# Patient Record
Sex: Male | Born: 1935 | Race: White | Hispanic: No | Marital: Married | Smoking: Never smoker
Health system: Southern US, Community
[De-identification: ages and names within clinical notes are randomized; demographics above are authoritative.]

## PROBLEM LIST (undated history)

## (undated) DIAGNOSIS — I2510 Atherosclerotic heart disease of native coronary artery without angina pectoris: Secondary | ICD-10-CM

## (undated) DIAGNOSIS — E11319 Type 2 diabetes mellitus with unspecified diabetic retinopathy without macular edema: Secondary | ICD-10-CM

## (undated) DIAGNOSIS — I255 Ischemic cardiomyopathy: Secondary | ICD-10-CM

## (undated) DIAGNOSIS — E1142 Type 2 diabetes mellitus with diabetic polyneuropathy: Secondary | ICD-10-CM

## (undated) DIAGNOSIS — G4700 Insomnia, unspecified: Secondary | ICD-10-CM

## (undated) DIAGNOSIS — I739 Peripheral vascular disease, unspecified: Secondary | ICD-10-CM

## (undated) DIAGNOSIS — N189 Chronic kidney disease, unspecified: Secondary | ICD-10-CM

## (undated) DIAGNOSIS — IMO0002 Reserved for concepts with insufficient information to code with codable children: Secondary | ICD-10-CM

## (undated) DIAGNOSIS — I6529 Occlusion and stenosis of unspecified carotid artery: Secondary | ICD-10-CM

## (undated) DIAGNOSIS — N200 Calculus of kidney: Secondary | ICD-10-CM

## (undated) DIAGNOSIS — M1990 Unspecified osteoarthritis, unspecified site: Secondary | ICD-10-CM

## (undated) DIAGNOSIS — Z8701 Personal history of pneumonia (recurrent): Secondary | ICD-10-CM

## (undated) DIAGNOSIS — I10 Essential (primary) hypertension: Secondary | ICD-10-CM

## (undated) DIAGNOSIS — Z86711 Personal history of pulmonary embolism: Secondary | ICD-10-CM

## (undated) DIAGNOSIS — I639 Cerebral infarction, unspecified: Secondary | ICD-10-CM

## (undated) DIAGNOSIS — E785 Hyperlipidemia, unspecified: Secondary | ICD-10-CM

## (undated) DIAGNOSIS — I824Z9 Acute embolism and thrombosis of unspecified deep veins of unspecified distal lower extremity: Secondary | ICD-10-CM

## (undated) HISTORY — DX: Insomnia, unspecified: G47.00

## (undated) HISTORY — DX: Calculus of kidney: N20.0

## (undated) HISTORY — DX: Hyperlipidemia, unspecified: E78.5

## (undated) HISTORY — DX: Peripheral vascular disease, unspecified: I73.9

## (undated) HISTORY — DX: Personal history of pulmonary embolism: Z86.711

## (undated) HISTORY — DX: Reserved for concepts with insufficient information to code with codable children: IMO0002

## (undated) HISTORY — DX: Occlusion and stenosis of unspecified carotid artery: I65.29

## (undated) HISTORY — DX: Acute embolism and thrombosis of unspecified deep veins of unspecified distal lower extremity: I82.4Z9

## (undated) HISTORY — DX: Type 2 diabetes mellitus with unspecified diabetic retinopathy without macular edema: E11.319

## (undated) HISTORY — DX: Chronic kidney disease, unspecified: N18.9

## (undated) HISTORY — DX: Unspecified osteoarthritis, unspecified site: M19.90

## (undated) HISTORY — PX: LITHOTRIPSY: SUR834

## (undated) HISTORY — DX: Atherosclerotic heart disease of native coronary artery without angina pectoris: I25.10

## (undated) HISTORY — DX: Type 2 diabetes mellitus with diabetic polyneuropathy: E11.42

## (undated) HISTORY — DX: Personal history of pneumonia (recurrent): Z87.01

---

## 1998-06-06 HISTORY — PX: CORONARY ARTERY BYPASS GRAFT: SHX141

## 1998-06-06 HISTORY — PX: CAROTID ENDARTERECTOMY: SUR193

## 2002-06-06 HISTORY — PX: COLONOSCOPY: SHX174

## 2006-06-06 DIAGNOSIS — IMO0002 Reserved for concepts with insufficient information to code with codable children: Secondary | ICD-10-CM

## 2006-06-06 HISTORY — DX: Reserved for concepts with insufficient information to code with codable children: IMO0002

## 2006-11-23 ENCOUNTER — Emergency Department (HOSPITAL_COMMUNITY): Admission: EM | Admit: 2006-11-23 | Discharge: 2006-11-23 | Payer: Self-pay | Admitting: Emergency Medicine

## 2008-06-06 DIAGNOSIS — I639 Cerebral infarction, unspecified: Secondary | ICD-10-CM

## 2008-06-06 DIAGNOSIS — Z86711 Personal history of pulmonary embolism: Secondary | ICD-10-CM

## 2008-06-06 DIAGNOSIS — I824Z9 Acute embolism and thrombosis of unspecified deep veins of unspecified distal lower extremity: Secondary | ICD-10-CM

## 2008-06-06 HISTORY — DX: Acute embolism and thrombosis of unspecified deep veins of unspecified distal lower extremity: I82.4Z9

## 2008-06-06 HISTORY — DX: Personal history of pulmonary embolism: Z86.711

## 2008-06-06 HISTORY — DX: Cerebral infarction, unspecified: I63.9

## 2009-01-23 ENCOUNTER — Emergency Department (HOSPITAL_COMMUNITY): Admission: EM | Admit: 2009-01-23 | Discharge: 2009-01-23 | Payer: Self-pay | Admitting: Emergency Medicine

## 2009-01-28 ENCOUNTER — Encounter: Admission: RE | Admit: 2009-01-28 | Discharge: 2009-01-28 | Payer: Self-pay

## 2009-01-29 ENCOUNTER — Ambulatory Visit: Payer: Self-pay | Admitting: Vascular Surgery

## 2009-01-29 ENCOUNTER — Inpatient Hospital Stay (HOSPITAL_COMMUNITY): Admission: EM | Admit: 2009-01-29 | Discharge: 2009-02-02 | Payer: Self-pay | Admitting: Emergency Medicine

## 2009-01-29 ENCOUNTER — Encounter (INDEPENDENT_AMBULATORY_CARE_PROVIDER_SITE_OTHER): Payer: Self-pay | Admitting: Emergency Medicine

## 2009-01-30 ENCOUNTER — Encounter (INDEPENDENT_AMBULATORY_CARE_PROVIDER_SITE_OTHER): Payer: Self-pay | Admitting: Internal Medicine

## 2009-12-16 ENCOUNTER — Encounter: Admission: RE | Admit: 2009-12-16 | Discharge: 2009-12-16 | Payer: Self-pay | Admitting: Nephrology

## 2010-01-05 ENCOUNTER — Ambulatory Visit (HOSPITAL_COMMUNITY): Admission: RE | Admit: 2010-01-05 | Discharge: 2010-01-05 | Payer: Self-pay | Admitting: Urology

## 2010-01-06 ENCOUNTER — Ambulatory Visit (HOSPITAL_COMMUNITY): Admission: RE | Admit: 2010-01-06 | Discharge: 2010-01-06 | Payer: Self-pay | Admitting: Urology

## 2010-01-07 ENCOUNTER — Ambulatory Visit (HOSPITAL_COMMUNITY): Admission: RE | Admit: 2010-01-07 | Discharge: 2010-01-07 | Payer: Self-pay | Admitting: Urology

## 2010-01-11 ENCOUNTER — Ambulatory Visit (HOSPITAL_COMMUNITY): Admission: RE | Admit: 2010-01-11 | Discharge: 2010-01-11 | Payer: Self-pay | Admitting: Urology

## 2010-01-15 ENCOUNTER — Ambulatory Visit (HOSPITAL_COMMUNITY): Admission: RE | Admit: 2010-01-15 | Discharge: 2010-01-15 | Payer: Self-pay | Admitting: Interventional Radiology

## 2010-01-25 ENCOUNTER — Ambulatory Visit (HOSPITAL_COMMUNITY): Admission: RE | Admit: 2010-01-25 | Discharge: 2010-01-25 | Payer: Self-pay | Admitting: Urology

## 2010-02-04 ENCOUNTER — Observation Stay (HOSPITAL_COMMUNITY): Admission: RE | Admit: 2010-02-04 | Discharge: 2010-02-05 | Payer: Self-pay | Admitting: Urology

## 2010-07-06 ENCOUNTER — Ambulatory Visit
Admission: RE | Admit: 2010-07-06 | Discharge: 2010-07-06 | Payer: Self-pay | Source: Home / Self Care | Attending: Urology | Admitting: Urology

## 2010-07-06 LAB — POCT I-STAT, CHEM 8
Calcium, Ion: 1.25 mmol/L (ref 1.12–1.32)
Chloride: 105 mEq/L (ref 96–112)
Creatinine, Ser: 1.8 mg/dL — ABNORMAL HIGH (ref 0.4–1.5)
HCT: 40 % (ref 39.0–52.0)
Potassium: 3.9 mEq/L (ref 3.5–5.1)
Sodium: 141 mEq/L (ref 135–145)

## 2010-07-08 NOTE — Op Note (Signed)
NAMEIMRAAN, Wesley [name]                ACCOUNT NO.:  [id number]  MEDICAL RECORD NO.:  [id number]          PATIENT TYPE:  AMB  LOCATION:  [facility]                         FACILITY:  [facility]  PHYSICIAN:  Courtney Paris, M.D.DATE OF BIRTH:  1931/07/31  DATE OF PROCEDURE:  07/06/2010 DATE OF DISCHARGE:                              OPERATIVE REPORT   PREOPERATIVE DIAGNOSES: 1. Left hydronephrosis. 2. Left ureteropelvic junction obstruction with stones.  POSTOPERATIVE DIAGNOSIS: 1. Left hydronephrosis. 2. Left ureteropelvic junction obstruction with stones.  OPERATION: 1. Cystoscopy. 2. Left retrograde pyelogram. 3. Exchange left ureteral stent.  ANESTHESIA:  General.  SURGEON:  Courtney Paris, MD  BRIEF HISTORY:  This 77 year old patient is admitted with persistent left proximal ureteral stones and obstruction for changing of the stent. He originally had presented with left hydronephrosis and had attempted left antegrade stent placement on January 05, 2010.  This was not successful.  Percutaneous nephrostomy was done on January 06, 2010, and the lithotripsy on January 07, 2010.  On January 11, 2010, interventional radiology tried to pass an antegrade stent, were unsuccessful.  Dr. Annabell Howells then did a percutaneous nephrolithotomy attempt on February 05, 2010, and was able to get an antegrade stent at that time.  I changed the stent.  He comes in to have the stent changed at this time.  He appears to have a UPJ obstruction with stones.  At that point, Dr. Annabell Howells was unable to get all the stones out because they were in the mucosa. He enters now for changing of the stent, possible ureteroscopy.  His wife has been on hospice for COPD and is failing.  DESCRIPTION OF PROCEDURE:  The patient was placed on the table in dorsal lithotomy position.  After satisfactory induction of general anesthesia, was prepped and draped with Betadine in the usual sterile fashion.  Time- out was then  performed and the patient's procedure then reconfirmed.  He had IV Cipro.  A #21 panendoscope showed anterior urethra was normal. Mild bilobar hyperplasia was noted was in the prostate and bladder was entered.  The stent was seen coming from the left ureteral orifice which looked normal.  I grabbed the end of the stent with grasping forceps and pulled this outside the urethral meatus and of course the stent came down below the point of obstruction.  I tried a sensor guidewire pass through the stent, but this would not go past the point of obstruction where there were still some residual stones.  I then passed a 6 open- ended ureteral catheter over the guidewire, removed the guidewire and performed a retrograde pyelogram.  A thin wisp of dye could be gotten through the point of obstruction into the kidney, but this was very small.  Using the 6 open-ended ureteral catheter, I was able to pass a Glidewire and under fluoroscopy, able to negotiate this past the UPJ into the kidney and then advance the 6 open-ended ureteral catheter and remove the Glidewire.  A guidewire was then passed and the 6 open-ended ureteral catheter was then removed.  The guidewire was then back loaded over the cystoscope and a new 6-French  x 26 cm length double-J ureteral stent was then passed over the guidewire and as the guidewire was removed, there was a nice coil in the renal pelvis and one in the bladder.  Bladder was drained, scope removed and the patient taken to recovery room in good condition.  Since the stent is hard to change, this will probably last about 3-4 months at least.  He probably should have a robotic pyeloplasty repair of this area of narrowing with the residual stones before the stent needs to be changed next time.     Courtney Paris, M.D.     HMK/MEDQ  D:  07/06/2010  T:  07/06/2010  Job:  [id number]  Electronically Signed by Vic Blackbird M.D. on 07/08/2010 01:25:51 PM

## 2010-08-19 LAB — GLUCOSE, CAPILLARY
Glucose-Capillary: 137 mg/dL — ABNORMAL HIGH (ref 70–99)
Glucose-Capillary: 52 mg/dL — ABNORMAL LOW (ref 70–99)
Glucose-Capillary: 65 mg/dL — ABNORMAL LOW (ref 70–99)
Glucose-Capillary: 78 mg/dL (ref 70–99)
Glucose-Capillary: 91 mg/dL (ref 70–99)

## 2010-08-19 LAB — TYPE AND SCREEN: ABO/RH(D): B POS

## 2010-08-20 LAB — CBC
HCT: 42.9 % (ref 39.0–52.0)
HCT: 42.4 % (ref 39.0–52.0)
Hemoglobin: 14.6 g/dL (ref 13.0–17.0)
MCH: 31.9 pg (ref 26.0–34.0)
MCH: 31.6 pg (ref 26.0–34.0)
MCHC: 34.5 g/dL (ref 30.0–36.0)
MCV: 91.5 fL (ref 78.0–100.0)
MCV: 93.7 fL (ref 78.0–100.0)
MCV: 91.9 fL (ref 78.0–100.0)
Platelets: 152 10*3/uL (ref 150–400)
Platelets: 168 10*3/uL (ref 150–400)
RBC: 4.58 MIL/uL (ref 4.22–5.81)
RDW: 13.5 % (ref 11.5–15.5)
RDW: 13.9 % (ref 11.5–15.5)
WBC: 7.3 10*3/uL (ref 4.0–10.5)

## 2010-08-20 LAB — SURGICAL PCR SCREEN: Staphylococcus aureus: NEGATIVE

## 2010-08-20 LAB — BASIC METABOLIC PANEL
BUN: 22 mg/dL (ref 6–23)
CO2: 26 mEq/L (ref 19–32)
Chloride: 109 mEq/L (ref 96–112)
Glucose, Bld: 103 mg/dL — ABNORMAL HIGH (ref 70–99)
Potassium: 4.1 mEq/L (ref 3.5–5.1)
Sodium: 141 mEq/L (ref 135–145)

## 2010-08-20 LAB — COMPREHENSIVE METABOLIC PANEL
Albumin: 3.9 g/dL (ref 3.5–5.2)
BUN: 19 mg/dL (ref 6–23)
Chloride: 106 mEq/L (ref 96–112)
Creatinine, Ser: 1.7 mg/dL — ABNORMAL HIGH (ref 0.4–1.5)
GFR calc non Af Amer: 39 mL/min — ABNORMAL LOW (ref >60)
Glucose, Bld: 66 mg/dL — ABNORMAL LOW (ref 70–99)
Total Bilirubin: 1.1 mg/dL (ref 0.3–1.2)

## 2010-08-20 LAB — GLUCOSE, CAPILLARY
Glucose-Capillary: 57 mg/dL — ABNORMAL LOW (ref 70–99)
Glucose-Capillary: 121 mg/dL — ABNORMAL HIGH (ref 70–99)
Glucose-Capillary: 149 mg/dL — ABNORMAL HIGH (ref 70–99)

## 2010-08-20 LAB — PROTIME-INR
INR: 1.03 (ref 0.00–1.49)
INR: 1.06 (ref 0.00–1.49)
Prothrombin Time: 13.7 seconds (ref 11.6–15.2)

## 2010-08-20 LAB — APTT: aPTT: 30 seconds (ref 24–37)

## 2010-09-11 LAB — CBC
Hemoglobin: 14.5 g/dL (ref 13.0–17.0)
Hemoglobin: 15 g/dL (ref 13.0–17.0)
Hemoglobin: 16.5 g/dL (ref 13.0–17.0)
MCHC: 34.5 g/dL (ref 30.0–36.0)
MCHC: 34.8 g/dL (ref 30.0–36.0)
MCV: 90.6 fL (ref 78.0–100.0)
MCV: 91.1 fL (ref 78.0–100.0)
Platelets: 117 10*3/uL — ABNORMAL LOW (ref 150–400)
Platelets: 124 10*3/uL — ABNORMAL LOW (ref 150–400)
RBC: 4.77 MIL/uL (ref 4.22–5.81)
RBC: 5.23 MIL/uL (ref 4.22–5.81)
RDW: 13 % (ref 11.5–15.5)
RDW: 13.1 % (ref 11.5–15.5)
RDW: 13.1 % (ref 11.5–15.5)
WBC: 7.2 10*3/uL (ref 4.0–10.5)

## 2010-09-11 LAB — CARDIAC PANEL(CRET KIN+CKTOT+MB+TROPI)
CK, MB: 2.1 ng/mL (ref 0.3–4.0)
CK, MB: 3 ng/mL (ref 0.3–4.0)
Relative Index: 2.1 (ref 0.0–2.5)
Relative Index: 2.3 (ref 0.0–2.5)
Relative Index: INVALID (ref 0.0–2.5)
Relative Index: INVALID (ref 0.0–2.5)
Total CK: 103 U/L (ref 7–232)
Troponin I: 0.13 ng/mL — ABNORMAL HIGH (ref 0.00–0.06)
Troponin I: 0.14 ng/mL — ABNORMAL HIGH (ref 0.00–0.06)
Troponin I: 0.14 ng/mL — ABNORMAL HIGH (ref 0.00–0.06)
Troponin I: 0.17 ng/mL — ABNORMAL HIGH (ref 0.00–0.06)

## 2010-09-11 LAB — COMPREHENSIVE METABOLIC PANEL
BUN: 9 mg/dL (ref 6–23)
CO2: 26 mEq/L (ref 19–32)
Calcium: 8.8 mg/dL (ref 8.4–10.5)
Creatinine, Ser: 1.05 mg/dL (ref 0.4–1.5)
GFR calc non Af Amer: >60 mL/min (ref >60)
Glucose, Bld: 185 mg/dL — ABNORMAL HIGH (ref 70–99)

## 2010-09-11 LAB — URINALYSIS, ROUTINE W REFLEX MICROSCOPIC
Bilirubin Urine: NEGATIVE
Glucose, UA: >1000 mg/dL — AB
Ketones, ur: 15 mg/dL — AB
Protein, ur: NEGATIVE mg/dL
pH: 5 (ref 5.0–8.0)

## 2010-09-11 LAB — GLUCOSE, CAPILLARY
Glucose-Capillary: 125 mg/dL — ABNORMAL HIGH (ref 70–99)
Glucose-Capillary: 162 mg/dL — ABNORMAL HIGH (ref 70–99)
Glucose-Capillary: 168 mg/dL — ABNORMAL HIGH (ref 70–99)
Glucose-Capillary: 176 mg/dL — ABNORMAL HIGH (ref 70–99)
Glucose-Capillary: 181 mg/dL — ABNORMAL HIGH (ref 70–99)
Glucose-Capillary: 191 mg/dL — ABNORMAL HIGH (ref 70–99)
Glucose-Capillary: 209 mg/dL — ABNORMAL HIGH (ref 70–99)
Glucose-Capillary: 238 mg/dL — ABNORMAL HIGH (ref 70–99)
Glucose-Capillary: 298 mg/dL — ABNORMAL HIGH (ref 70–99)

## 2010-09-11 LAB — URINE MICROSCOPIC-ADD ON

## 2010-09-11 LAB — DIFFERENTIAL
Basophils Absolute: 0 10*3/uL (ref 0.0–0.1)
Basophils Relative: 0 % (ref 0–1)
Eosinophils Absolute: 0.2 10*3/uL (ref 0.0–0.7)
Eosinophils Absolute: 0.3 10*3/uL (ref 0.0–0.7)
Lymphocytes Relative: 35 % (ref 12–46)
Lymphs Abs: 3.3 10*3/uL (ref 0.7–4.0)
Monocytes Absolute: 0.4 10*3/uL (ref 0.1–1.0)
Monocytes Relative: 4 % (ref 3–12)
Neutro Abs: 5 10*3/uL (ref 1.7–7.7)
Neutro Abs: 8.2 10*3/uL — ABNORMAL HIGH (ref 1.7–7.7)
Neutrophils Relative %: 53 % (ref 43–77)
Neutrophils Relative %: 78 % — ABNORMAL HIGH (ref 43–77)

## 2010-09-11 LAB — LIPID PANEL
Cholesterol: 169 mg/dL (ref 0–200)
LDL Cholesterol: 111 mg/dL — ABNORMAL HIGH (ref 0–99)
Triglycerides: 152 mg/dL — ABNORMAL HIGH (ref <150)

## 2010-09-11 LAB — POCT CARDIAC MARKERS
CKMB, poc: 2.6 ng/mL (ref 1.0–8.0)
CKMB, poc: 2.8 ng/mL (ref 1.0–8.0)
Myoglobin, poc: 104 ng/mL (ref 12–200)
Troponin i, poc: <0.05 ng/mL (ref 0.00–0.09)

## 2010-09-11 LAB — PROTIME-INR
INR: 1.1 (ref 0.00–1.49)
INR: 1.2 (ref 0.00–1.49)
INR: 1.8 — ABNORMAL HIGH (ref 0.00–1.49)
INR: 1.9 — ABNORMAL HIGH (ref 0.00–1.49)
Prothrombin Time: 14.8 seconds (ref 11.6–15.2)
Prothrombin Time: 20.9 seconds — ABNORMAL HIGH (ref 11.6–15.2)

## 2010-09-11 LAB — BASIC METABOLIC PANEL
BUN: 11 mg/dL (ref 6–23)
CO2: 26 mEq/L (ref 19–32)
Chloride: 100 mEq/L (ref 96–112)
GFR calc non Af Amer: >60 mL/min (ref >60)
Glucose, Bld: 306 mg/dL — ABNORMAL HIGH (ref 70–99)
Potassium: 4.5 mEq/L (ref 3.5–5.1)
Sodium: 139 mEq/L (ref 135–145)

## 2010-09-11 LAB — CULTURE, BLOOD (ROUTINE X 2)

## 2010-09-11 LAB — URINE CULTURE

## 2010-09-11 LAB — HEMOGLOBIN A1C
Hgb A1c MFr Bld: 9.8 % — ABNORMAL HIGH (ref 4.6–6.1)
Mean Plasma Glucose: 235 mg/dL

## 2010-09-11 LAB — CK TOTAL AND CKMB (NOT AT ARMC): CK, MB: 2.9 ng/mL (ref 0.3–4.0)

## 2010-10-19 NOTE — Consult Note (Signed)
NAMEMACKSON, BOTZ NO.:  [id number]   MEDICAL RECORD NO.:  [id number]          PATIENT TYPE:  INP   LOCATION:  3314                         FACILITY:  [facility]   PHYSICIAN:  Cassell Clement, M.D. DATE OF BIRTH:  1931/08/31   DATE OF CONSULTATION:  DATE OF DISCHARGE:                                 CONSULTATION   CHIEF COMPLAINT:  Syncope.   HISTORY:  This is a 77 year old Caucasian gentleman.  We are asked to  see him in regard to his elevated cardiac enzymes in the face of  pulmonary emboli.  We were requested to see him by [facility] hospital[facility].  The history is that the patient has known stable coronary artery  disease.  He had coronary artery bypass graft surgery in 2000.  He has  also had bilateral carotid endarterectomies in 2000.  His last  Cardiolite stress test was on March 02, 2005 and at that time showed  no evidence of ischemia.  He is scheduled for another stress test in  November.  He was in his usual state of health until Friday morning,  January 23, 2009 at about 8 a.m. when standing outside his home, he had  sudden syncope without warning.  He was unresponsive for just over a  short period of time.  He was too weak to let himself back in his house  and his wife had to come and help him in.  He did not initially want to  go to the emergency room and so he lay on the floor for several hours  and was dizzy and experiencing headache and was also profusely  diaphoretic.  He finally agreed to allow his wife to call 911 and he was  taken to [facility] Emergency Room where he was evaluated and sent  home.  He then saw Dr. Collins Scotland on Monday, January 26, 2009 and she ordered  an MRI and an MRA of the head to pursue the initial impression of  vertigo or something cerebrovascular which caused this syncope.  He was  in the process of that workup when he awoke on Thursday, January 29, 2009  with right calf soreness and marked edema and swelling and pain.  At  this time, he came to [facility] Emergency Room where he was admitted for  evaluation.  On admission, his D-dimer was more than 20 and he was noted  to have deep vein thrombosis.  Therefore, a CT angiogram was ordered  which showed multiple bilateral pulmonary emboli.  The patient is now on  full-dose Lovenox as well as starting on Coumadin.  The patient had an  echocardiogram today with the full report present in the chart, but  essentially showing normal left ventricular systolic function but with  septal dyssynergy consistent with some right ventricular pressure  overload consistent with pulmonary emboli.  There were no significant  valve abnormalities.   HOME MEDICATIONS:  1. Aspirin 325 daily.  2. Actos 45 mg daily.  3. Monopril 10 mg daily.  4. Pravastatin daily.  5. Coenzyme Q10 daily.  6. Lantus insulin 35 units daily.  Family history reveals that his father died with a pulmonary embolus at  70 and the mother died of a stroke at 49.  He has a 71 year old brother  who was murdered, but was also a diabetic.  He has a sister who has a  history of stroke who is alive and another sister alive with diabetes.   Social history reveals that he is a retired Transport planner.  He has never  smoked.  He does not use alcohol and he drinks very little caffeine.  He  enjoys playing golf.   Review of systems reveals that he has not been aware of any change in  gastrointestinal or genitourinary function.  There is no history of any  prostate problems.  The patient denies any cough or sputum production.  He had no hemoptysis, chest pain, or pleurisy.  He has had dyspnea for  the past week.  All other systems negative in detail.   PHYSICAL EXAMINATION:  VITAL SIGNS:  This evening, his blood pressure is  135/58 but earlier today, he had a problem with marked orthostatic  hypotension which responded to a bolus of IV fluids.  His pulse was 70  and regular, O2 sat is 100% on 2 L per minute of oxygen.   GENERAL APPEARANCE:  A well-developed, well-nourished gentleman in no  distress.  HEENT:  Pupils are equal and reactive.  Extraocular movements are full.  The fundi show no abnormalities.  NECK:  Jugular venous pressure is normal.  The carotids are normal.  Thyroid normal.  No lymphadenopathy.  CHEST:  Clear.  HEART:  Reveals no murmur, gallop, rub, or click.  There is no abnormal  lift or heave.  ABDOMEN:  Soft without hepatosplenomegaly or masses.  EXTREMITIES:  Show that the right calf is more swollen than the left,  but by report it has improved greatly since admission.   Pertinent labs include cardiac enzymes.  His troponin's were 0.16, 0.13,  and 0.14, but the CK-MBs are normal at 2.5, 2.5, and 2.4.  His  electrocardiogram shows normal sinus rhythm and a pattern of an old  inferior wall MI and no acute ischemic changes.   IMPRESSION:  1. Syncope secondary to orthostatic hypotension secondary to multiple      pulmonary emboli.  2. Family history of pulmonary emboli.  3. The pulmonary emboli are sufficient explanation for the slight bump      in troponin's.  There is no evidence for an acute myocardial      infarction.  4. Stable coronary artery disease, status post coronary artery bypass      graft surgery in 2000.  5. Status post bilateral carotid endarterectomies in 2000.   RECOMMENDATIONS:  I agree with current management of full-dose Lovenox  transitioning to Coumadin.  His activity will be gradually progressed in  the hospital.  I would anticipate he could be moved to telemetry floor  tomorrow if he remains stable overnight.   Many thanks for the opportunity to see this pleasant gentleman in the  hospital.           ______________________________  Cassell Clement, M.D.     TB/MEDQ  D:  01/30/2009  T:  01/31/2009  Job:  [id number]   cc:   Tammy R. Collins Scotland, M.D.  [facility], E Team

## 2010-10-19 NOTE — Discharge Summary (Signed)
Wesley [name], Wesley [name]   MEDICAL RECORD NO.:  [name]          PATIENT TYPE:  INP   LOCATION:  3715                         FACILITY:  [facility]   PHYSICIAN:  Ruthy Dick, MD    DATE OF BIRTH:  30-Dec-1931   DATE OF ADMISSION:  01/29/2009  DATE OF DISCHARGE:  02/02/2009                               DISCHARGE SUMMARY   REASON FOR ADMISSION:  Dizziness, vertigo, and recent syncope.   FINAL DISCHARGE DIAGNOSES:  1. Bilateral pulmonary embolism.  2. Right lower extremity deep venous thrombosis.  3. Orthostatic hypotension, resolved.  4. Dizziness and vertigo, resolved.  5. Elevated troponin likely secondary to pulmonary embolism,      noncardiac.  6. Recent syncope again likely secondary to pulmonary embolism and      orthostatic hypotension.  7. Subacute cerebrovascular accident.  8. Diabetes mellitus type 2, poor control with a hemoglobin A1c of      9.9.  9. Coronary artery disease with history of coronary artery bypass      graft.  10.Dyslipidemia  11.Hypertension.  12.Thrombocytopenia, improved probably was due to Plavix.  13.Bilateral carotid endarterectomy about 10 years ago.  14.Peripheral vascular disease.   PROCEDURE DONE DURING THIS ADMISSION:  1. MRI, MRA of the brain was done on January 28, 2009, after the      patient presented with syncopal episode and was read as having      focal area of decreased caliber involving the distal cavernous      segment of the left internal carotid artery suggestive of mild-to-      moderate atherosclerotic-related stenosis.  MRA of the brain was      said to have focal area of decreased caliber involving the distal      cavernous segment of the left internal carotid artery suggestive of      mild-to-moderate atherosclerosis.  There was also atherosclerosis      involving the right posterior inferior cerebellar artery.  The MRI      of the brain done at the same time showed a small focal area  of      restricted diffusion in the right occipital region probably      representing subacute ischemia.  2. CT angiogram of the chest was done at this presentation and showed      extensive pulmonary emboli involving the proximal lobar branches      bilaterally.  There was no evidence of significant airspace      disease.  3. A 2-D echocardiogram, which was read as having ejection fraction of      60-65% with a right ventricular pressure overload.  4. Carotid Doppler was ordered, results pending.   CONSULT DURING THIS ADMISSION:  Cardiology consult for elevated  troponin.   BRIEF HISTORY OF PRESENTING ILLNESS AND HOSPITAL COURSE:  This is a 77-  year-old Caucasian male with a past medical history significant for  hypertension, diabetes, coronary artery disease with CABG about 10 years  ago who came in with feelings of dizziness and vertigo.  Prior to that,  the patient  had a syncopal episode and had an MRI, which showed subacute  ischemia.  The patient continues to be on aspirin.  In any case during  this admission, the patient was noted to have DVT of the right lower  extremity and further workup revealed that the patient had bilateral  pulmonary emboli.  The was patient started on anticoagulation with  Lovenox bridge with transition to Coumadin.  The patient's symptoms  improved dramatically with introduction of Lovenox and Coumadin.  The  patient also was noted to have orthostatic hypotension with gentle  dehydration, this seems to have resolved as last orthostatic vitals were  within normal range.  The patient has no complaints whatsoever today.  No chest pain, no shortness of breath, no abdominal pain, no nausea, no  vomiting, no diarrhea, no constipation, no syncope, no palpitations, no  chills.   PHYSICAL EXAMINATION:  VITAL SIGNS:  Temperature of 98.3, pulse 50,  respiration 18, blood pressure 116/60, saturating 98% on room air.  CHEST:  Clear to auscultation bilaterally.   ABDOMEN:  Soft, nontender.  EXTREMITIES:  No clubbing, no cyanosis, no edema.  CARDIOVASCULAR:  First and second heart sounds only.  CENTRAL NERVOUS SYSTEM:  Nonfocal.  Cranial nerves intact II through  XII.  The patient has normal reflexes bilaterally.   The patient is to be discharged on the following medications:  1. Coumadin 5 mg p.o. daily and to be adjusted according to INR by      primary care physician.  2. Lovenox 110 mg subcu q.12 h. and to be stopped once INR is 2 to 3.  3. Lantus insulin increased to 35 units subcu daily.  4. Lisinopril 10 mg p.o. daily.  5. Metoprolol 12.5 mg p.o. b.i.d.  6. Glimepiride 2 mg p.o. b.i.d.  7. Pravastatin 40 mg p.o. b.i.d.  8. Aspirin 325 mg p.o. daily.   The patient is to follow up with his primary care physician, Dr. Herb Grays in about 1-2 weeks and the patient is to call for this  appointment.  Prior to this, the patient is to go to Dr. Alda Berthold office  on February 04, 2009 for INR checked and the patient is to stop Lovenox  if INR is 2 to 3.  The patient is to keep prior Cardiology appointment  as previously scheduled.   We appeal to Dr. Collins Scotland to evaluate whether patient should have further  workup CT abdomen and pelvis to rule out malignancy as the cause of his  thromboembolic disease.   TOTAL TIME USED FOR DISCHARGE:  Forty five minutes.      Ruthy Dick, MD  Electronically Signed     GU/MEDQ  D:  02/02/2009  T:  02/03/2009  Job:  [id number]   cc:   Tammy R. Collins Scotland, M.D.

## 2010-10-19 NOTE — H&P (Signed)
NAMEBLAIKE, Wesley NO.:  [id number]   MEDICAL RECORD NO.:  [id number]          PATIENT TYPE:  INP   LOCATION:  3314                         FACILITY:  [facility]   PHYSICIAN:  Ruthy Dick, MD    DATE OF BIRTH:  [birth year]   DATE OF ADMISSION:  01/29/2009  DATE OF DISCHARGE:                              HISTORY & PHYSICAL   PRIMARY CARE PHYSICIAN:  Tammy R. Collins Scotland, MD   CHIEF COMPLAINT:  Dizziness and a vertigo today, also syncopal episode  last week.   BRIEF HISTORY OF PRESENTING ILLNESS AND HOSPITAL COURSE:  Wesley [name] is  a 77 year old Caucasian male with a past medical history significant for  hypertension, diabetes mellitus type 2, coronary artery disease with  CABG about 10 years ago, bilateral carotid endarterectomy who came to  the hospital because of feelings of dizziness and vertigo today.  According to the patient and his daughter who had a source of this  history, the patient about a week ago was at home with the wife stepping  outside, passed out on the porch, they came to the emergency room and  when they were in the emergency room, they reported to the ER physician  that he was just sitting down.  According to the daughter the father  said this, so that the mother will not really get worried.  But  according to the father, he actually passed out and probably passed out  for about 30 seconds to 1 minute.  He said he did not weight himself and  he knew what was happening around him once he came to, otherwise he had  no chest pain, no shortness of breath, no abdominal pain, no nausea at  that time.  He reports having consistent dyspnea on exertion by the way.  The patient was treated in the emergency room and was sent home.  However, today the patient had another episode of being very dizzy, but  did not pass out today.  He also said he had a spinning sensation around  himself, again no overt shortness of breath at rest, but there was  dyspnea on  exertion.  He denies having any chest pain.  Denies abdominal  pain.  Denies nausea or vomiting.  Denies palpitations.  Denies dysuria,  frequency.  Denies fever, chills.  He denies paroxysmal nocturnal  dyspnea and denies orthopnea.  Because of his presentation a week ago,  the patient was sent to have an MRI, which was done yesterday the  results of which shows a subacute occipital ischemia.  The patient also  noted that his right leg was swollen today and that was one of the  reason that brought him to the hospital.  Preliminary workup shows that  the patient has DVT in his right foot as well.   PAST MEDICAL HISTORY:  1. Hypertension.  2. Diabetes mellitus type 2 with no known A1c at this time.  3. Coronary tree disease with history of CABG about 10 years ago.  4. Bilateral carotid endarterectomy at about 10 years ago.  5. Dyslipidemia.  6. Peripheral vascular  disease   SOCIAL HISTORY:  The patient lives with family.  Does not drink alcohol,  does not smoke cigarettes, and does not do illicit drugs.   FAMILY HISTORY:  Significant for hypertension, diabetes mellitus, and  dyslipidemia.   MEDICATIONS:  1. Lentus 30 units subcutaneously daily.  2. Glimepiride 2 mg two times daily.  3. Lisinopril 40 mg daily.  4. Pravastatin 40 mg two times daily.  5. Aspirin 325 mg daily.   ALLERGIES:  The patient says that he is allergic to DOXYCYCLINE and it  causes a swelling all over.   REVIEW OF SYSTEMS:  A 12-point review of system was done, was negative  except as noted in the history of present illness.  Additionally, the  patient denies abdominal pain, denies melanotic stool, denies  hematochezia, denies hematemesis, also denies diplopia and photophobia.  Denies rash and right lower extremity swelling.   PHYSICAL EXAMINATION:  GENERAL:  The patient was seen and examined in  the emergency room.  He is alert and oriented x3, no acute  cardiopulmonary distress.  VITAL SIGNS:   Temperature 98.4, pulse 78, blood pressure 171/76,  respirations 16, and saturating 96% on room air.  HEENT:  Normocephalic, atraumatic.  Pupils are equal, round, and  reactive to light.  Extraocular muscles intact.  Nares patent.  NECK:  Supple.  No JVD.  No lymphadenopathy or thyromegaly.  CHEST:  Clear to auscultation bilaterally.  No rhonchi, no rales, no  wheezing.  ABDOMEN:  Soft, nontender.  No hepatosplenomegaly.  EXTREMITIES:  No clubbing, no cyanosis, no edema, but right lower  extremity from the knee down is most swollen than the left, is slightly  warm, but no evidence of cellulitis and is also tender to touch.  CARDIOVASCULAR:  First and second heart sounds noted to be normal.  No  murmurs.  No gallops.  No rubs.  CENTRAL NERVOUS SYSTEM:  No obvious focal deficits at this time.  Cranial nerves intact, II through XII.  The patient has normal reflexes,  5/5 in all extremities.   LABORATORY DATA AND INVESTIGATIONS:  WBCs 10.6, platelets 145 with 78%  segments.  D-dimer is 20.0, glucose is 306, otherwise BMP is normal.  Troponin; first set of troponin was 0.05 with a second one is 0.17.  Chest x-ray is read as having no pulmonary edema, pneumonia, or pleural  effusion with a slight revision of the right hemidiaphragm.  MRI, MRA of  the brain done yesterday showed a small focal area of restricted  diffusion in the right occipital region probably represent an subacute  ischemia.  There was also nonspecific subcortical white matter changes,  supratentorial and at the pons, which probably presents areas of  ischemic gliosis related to small vessel disease or possibility of  demyelinating process.   ASSESSMENT:  1. Deep venous thrombosis of right lower extremities.  2. Elevated troponin, rule out non-ST elevated myocardial infarction.  3. Recent syncope.  4. Subacute cerebrovascular accident.  5. Hypertension.  6. Diabetes mellitus type 2.  7. Coronary tree disease, status  post coronary artery bypass graft in      the past.  8. History of bilateral carotid endarterectomy.  9. Dyslipidemia.  10.Rule out pulmonary embolism.   PLAN OF CARE:  This patient will be admitted to step-down unit because  of his elevated troponin.  For now, if the troponin continues to get  worse, then the patient will be transferred to intensive care unit.  The  elevation of troponin  may also be a results of the patient's other  medical issues going on at this time, which may or may not include a  pulmonary embolism.  Because of this, we are going to get a CT angiogram  of the chest to rule out pulmonary embolism and we are going to start  this patient on full anticoagulation with Lovenox and also start  Coumadin at the same time.  We will continue this patient on aspirin and  we will consult a Neurology to see whether we will have to give this  patient of Plavix or Aggrenox considering the fact that the patient has  been on aspirin religiously in the past.  We will try to rule out the  infectious angle with getting a urinalysis with culture and sensitivity.  We will give the patient on Protonix considering distress of his  presentation.  A 2-D echo and carotid Doppler will be in order to check  for left ventricular ejection fraction and possible source of cardiac  emboli.  I also plan to consult Cardiology in this patient with a  syncopal episode and elevated troponin.      Ruthy Dick, MD  Electronically Signed     GU/MEDQ  D:  01/29/2009  T:  01/30/2009  Job:  [id number]

## 2010-11-02 ENCOUNTER — Ambulatory Visit (HOSPITAL_BASED_OUTPATIENT_CLINIC_OR_DEPARTMENT_OTHER)
Admission: RE | Admit: 2010-11-02 | Discharge: 2010-11-02 | Disposition: A | Discharge status: Home / Self Care | Payer: Medicare Other | Source: Ambulatory Visit | Attending: Urology | Admitting: Urology

## 2010-11-02 ENCOUNTER — Ambulatory Visit (HOSPITAL_COMMUNITY)
Admission: RE | Admit: 2010-11-02 | Discharge: 2010-11-02 | Disposition: A | Discharge status: Home / Self Care | Payer: Medicare Other | Source: Ambulatory Visit | Attending: Urology | Admitting: Urology

## 2010-11-02 DIAGNOSIS — Z86718 Personal history of other venous thrombosis and embolism: Secondary | ICD-10-CM | POA: Insufficient documentation

## 2010-11-02 DIAGNOSIS — Z7901 Long term (current) use of anticoagulants: Secondary | ICD-10-CM | POA: Insufficient documentation

## 2010-11-02 DIAGNOSIS — N201 Calculus of ureter: Secondary | ICD-10-CM | POA: Insufficient documentation

## 2010-11-02 DIAGNOSIS — N135 Crossing vessel and stricture of ureter without hydronephrosis: Secondary | ICD-10-CM | POA: Insufficient documentation

## 2010-11-02 DIAGNOSIS — Z466 Encounter for fitting and adjustment of urinary device: Secondary | ICD-10-CM | POA: Insufficient documentation

## 2010-11-02 DIAGNOSIS — Z01812 Encounter for preprocedural laboratory examination: Secondary | ICD-10-CM | POA: Insufficient documentation

## 2010-11-02 LAB — APTT: aPTT: 28 seconds (ref 24–37)

## 2010-11-02 LAB — BASIC METABOLIC PANEL
Chloride: 100 mEq/L (ref 96–112)
Creatinine, Ser: 1.55 mg/dL — ABNORMAL HIGH (ref 0.4–1.5)
GFR calc Af Amer: 53 mL/min — ABNORMAL LOW (ref >60)
Potassium: 4.7 mEq/L (ref 3.5–5.1)

## 2010-11-02 LAB — CBC
HCT: 41 % (ref 39.0–52.0)
Hemoglobin: 14.4 g/dL (ref 13.0–17.0)
RDW: 12.8 % (ref 11.5–15.5)
WBC: 6.7 10*3/uL (ref 4.0–10.5)

## 2010-11-02 LAB — GLUCOSE, CAPILLARY: Glucose-Capillary: 205 mg/dL — ABNORMAL HIGH (ref 70–99)

## 2010-11-09 NOTE — Op Note (Signed)
  [name], [name] NO.:  [id number]  MEDICAL RECORD NO.:  [id number]           PATIENT TYPE:  O  LOCATION:  XRAY                         FACILITY:  [facility]  PHYSICIAN:  Courtney Paris, M.D.DATE OF BIRTH:  26-Jan-1932  DATE OF PROCEDURE:  11/02/2010 DATE OF DISCHARGE:                              OPERATIVE REPORT   PREOPERATIVE DIAGNOSIS:  Left proximal ureteral obstruction by stone and ureteropelvic junction obstruction.  POSTOPERATIVE DIAGNOSIS:  Left proximal ureteral obstruction by stone and ureteropelvic junction obstruction.  OPERATION:  Cystoscopy, exchange left ureteral stent.  ANESTHESIA:  General.  SURGEON:  Courtney Paris, MD  BRIEF HISTORY:  This 77 year old patient has had 2 previous percutaneous procedures for proximal left ureteral stone, first by Dr. Annabell Howells in September 2011, also lithotripsy in August 2011.  Even had antegrade placement of a stent by Radiology.  He had 3 separate ureteroscopies for stones in late 2010.  He is on Coumadin for DVT and has been off for 5 days.  He needs to have a laparoscopic excision of the UPJ obstruction and removal of the stone and re-anastomosis of this until he no longer has to care for his wife who is on hospice secondary to COPD, he cannot do this.  He wants to have a stent changed which was last changed in January 2012.  PROCEDURE IN DETAIL:  The patient was placed on the operating table in dorsal lithotomy position.  After satisfactory induction of general anesthesia, was prepped and draped with Betadine in usual sterile fashion.  He was given IV Cipro.  Time-out was then performed and the patient and procedure reconfirmed.  The scope 21-French was passed through the anterior urethra which was normal.  Posterior urethra, had trilobar enlargement of the prostate and the bladder was entered.  The stent was seen coming from the left ureteral orifice.  It was not very calcified or  encrusted.  Rest of the bladder looked normal.  The end of the stent was then grasped with grasping forceps and pulled outside the urethral meatus.  Under fluoroscopy, a guidewire was then passed up through the stent up beyond the stone into the renal pelvis.  Stent was then removed.  The guidewire was then back loaded over the cystoscope and a new 6-French x 26 cm length double-J ureteral stent was then passed without difficulty and when the guidewire was removed, there was a nice coil in the renal pelvis and one in the bladder just as it was previously.  The bladder was drained.  He was given a B and O suppository and some intraurethral Xylocaine anesthesia.  He was taken to recovery room in good condition and later discharged as an outpatient, will start back on his Coumadin tonight.     Courtney Paris, M.D.     HMK/MEDQ  D:  11/02/2010  T:  11/02/2010  Job:  [id number]  Electronically Signed by Vic Blackbird M.D. on 11/09/2010 12:18:06 PM

## 2011-02-21 ENCOUNTER — Encounter: Payer: Self-pay | Admitting: *Deleted

## 2011-02-21 DIAGNOSIS — Z86711 Personal history of pulmonary embolism: Secondary | ICD-10-CM | POA: Insufficient documentation

## 2011-02-21 DIAGNOSIS — I739 Peripheral vascular disease, unspecified: Secondary | ICD-10-CM | POA: Insufficient documentation

## 2011-02-21 DIAGNOSIS — I824Z9 Acute embolism and thrombosis of unspecified deep veins of unspecified distal lower extremity: Secondary | ICD-10-CM | POA: Insufficient documentation

## 2011-02-21 DIAGNOSIS — I2510 Atherosclerotic heart disease of native coronary artery without angina pectoris: Secondary | ICD-10-CM | POA: Insufficient documentation

## 2011-02-28 ENCOUNTER — Ambulatory Visit (INDEPENDENT_AMBULATORY_CARE_PROVIDER_SITE_OTHER): Payer: Medicare Other | Admitting: Cardiology

## 2011-02-28 ENCOUNTER — Encounter: Payer: Self-pay | Admitting: Cardiology

## 2011-02-28 VITALS — BP 120/60 | HR 66 | Wt 222.0 lb

## 2011-02-28 DIAGNOSIS — N200 Calculus of kidney: Secondary | ICD-10-CM | POA: Insufficient documentation

## 2011-02-28 DIAGNOSIS — Z951 Presence of aortocoronary bypass graft: Secondary | ICD-10-CM

## 2011-02-28 DIAGNOSIS — Z86711 Personal history of pulmonary embolism: Secondary | ICD-10-CM

## 2011-02-28 DIAGNOSIS — I2699 Other pulmonary embolism without acute cor pulmonale: Secondary | ICD-10-CM

## 2011-02-28 DIAGNOSIS — E119 Type 2 diabetes mellitus without complications: Secondary | ICD-10-CM

## 2011-02-28 DIAGNOSIS — Z9889 Other specified postprocedural states: Secondary | ICD-10-CM

## 2011-02-28 DIAGNOSIS — E7800 Pure hypercholesterolemia, unspecified: Secondary | ICD-10-CM

## 2011-02-28 DIAGNOSIS — I2510 Atherosclerotic heart disease of native coronary artery without angina pectoris: Secondary | ICD-10-CM

## 2011-02-28 NOTE — Progress Notes (Signed)
Wesley [name] Date of Birth:  Jul 18, 1931 [facility] [address]   [address] [place], [place]  [address] [phone]           Fax   [phone]  History of Present Illness: This pleasant 77 year old gentleman is seen after a long absence.  He has a history of deep vein thrombosis and bilateral pulmonary emboli 2 years ago for which she was hospitalized and started on Coumadin.  He presented at that time with syncope.  His Coumadin is managed by Dr. Collins Scotland. The patient has a past history of known ischemic heart disease.  He underwent coronary artery bypass graft surgery in 2000.  The surgery was done in [place].  Patient has a long history of hypercholesterolemia and diabetes the patient has not been experiencing any definite chest pain but has had some shortness of breath.  He's also noted a decrease in memory.  He is followed closely by urology and is going to require kidney surgery in the near future.  The patient presently is the caregiver for his wife who has end stage COPD and is under hospice care.  Current Outpatient Prescriptions  Medication Sig Dispense Refill  . fosinopril (MONOPRIL) 40 MG tablet Take 40 mg by mouth daily.        . insulin glargine (LANTUS) 100 UNIT/ML injection Inject into the skin at bedtime. 25 units daily      . pravastatin (PRAVACHOL) 40 MG tablet Take 40 mg by mouth 2 (two) times daily.       Marland Kitchen warfarin (COUMADIN) 5 MG tablet Take 5 mg by mouth daily. As directed by Dr. Collins Scotland         Allergies  Allergen Reactions  . Doxycycline     swelling    Patient Active Problem List  Diagnoses  . Calf DVT (deep venous thrombosis)  . PVD (peripheral vascular disease)  . Coronary artery disease  . Hx pulmonary embolism    History  Smoking status  . Never Smoker   Smokeless tobacco  . Not on file    History  Alcohol Use     Family History  Problem Relation Age of Onset  . Stroke Mother     age 57  . Other Father    pulmonary embolus age 61  . Diabetes Sister   . Stroke Sister   . Diabetes Brother     brother was murdered  . Diabetes Sister     Review of Systems: Constitutional: no fever chills diaphoresis or fatigue or change in weight.  Head and neck: no hearing loss, no epistaxis, no photophobia or visual disturbance. Respiratory: No cough, shortness of breath or wheezing. Cardiovascular: No chest pain peripheral edema, palpitations. Gastrointestinal: No abdominal distention, no abdominal pain, no change in bowel habits hematochezia or melena. Genitourinary: No dysuria, no frequency, no urgency, no nocturia. Musculoskeletal:No arthralgias, no back pain, no gait disturbance or myalgias. Neurological: No dizziness, no headaches, no numbness, no seizures, no syncope, no weakness, no tremors. Hematologic: No lymphadenopathy, no easy bruising. Psychiatric: No confusion, no hallucinations, no sleep disturbance.    Physical Exam: Filed Vitals:   02/28/11 1442  BP: 120/60  Pulse: 66  The general appearance reveals a large Caucasian gentleman in no acute distressPupils equal and reactive.   Extraocular Movements are full.  There is no scleral icterus.  The mouth and pharynx are normal.  The neck is supple.  The carotids reveal no bruits.  The jugular venous pressure is  normal.  The thyroid is not enlarged.  There is no lymphadenopathy.  The chest is clear to percussion and auscultation. There are no rales or rhonchi. Expansion of the chest is symmetrical.  The precordium is quiet.  The first heart sound is normal.  The second heart sound is physiologically split.  There is no murmur gallop rub or click.  There is no abnormal lift or heave.  The abdomen is soft and nontender. Bowel sounds are normal. The liver and spleen are not enlarged. There Are no abdominal masses. There are no bruits.  The pedal pulses are good.  There is no phlebitis or edema.  There is no cyanosis or clubbing.   The skin is  warm and dry.  There is no rash.      Assessment / Plan: We will have him return for a LexiScan Myoview stress test to evaluate for cardiac risk prior to kidney surgery

## 2011-02-28 NOTE — Patient Instructions (Signed)
We will schedule a Lexiscan Myoview.

## 2011-02-28 NOTE — Assessment & Plan Note (Signed)
The patient has a history of known ischemic heart disease with coronary artery bypass graft surgery in 2000.  His last nuclear stress test was on 03/02/05 at which time he was able to walk for 5 minutes on the treadmill and his ejection fraction was 50% and there was no reversible ischemia.  Now since his pulmonary emboli and what he refers to as his stroke he has weakness in his legs and poor balance and would be unable to walk on a treadmill and we will use a LexiScan Myoview to evaluate him for risk of surgery

## 2011-02-28 NOTE — Assessment & Plan Note (Signed)
The patient has a large left sided kidney stone and he has percutaneous drainage tube which has to be changed every 3 months.  His surgeon has requested cardiac clearance to proceed with surgery on the large kidney stone.

## 2011-02-28 NOTE — Assessment & Plan Note (Signed)
The patient has not had any pleuritic chest pain or hemoptysis or acute symptoms to suggest recurrent pulmonary emboli.

## 2011-03-08 ENCOUNTER — Encounter: Payer: Self-pay | Admitting: *Deleted

## 2011-03-10 ENCOUNTER — Ambulatory Visit (HOSPITAL_COMMUNITY): Payer: Medicare Other | Attending: Cardiology | Admitting: Radiology

## 2011-03-10 VITALS — Ht 74.0 in | Wt 218.0 lb

## 2011-03-10 DIAGNOSIS — I25810 Atherosclerosis of coronary artery bypass graft(s) without angina pectoris: Secondary | ICD-10-CM

## 2011-03-10 DIAGNOSIS — I2510 Atherosclerotic heart disease of native coronary artery without angina pectoris: Secondary | ICD-10-CM

## 2011-03-10 DIAGNOSIS — Z951 Presence of aortocoronary bypass graft: Secondary | ICD-10-CM

## 2011-03-10 DIAGNOSIS — R55 Syncope and collapse: Secondary | ICD-10-CM

## 2011-03-10 MED ORDER — TECHNETIUM TC 99M TETROFOSMIN IV KIT
33.0000 | PACK | Freq: Once | INTRAVENOUS | Status: AC | PRN
  Administered 2011-03-10: 33 via INTRAVENOUS

## 2011-03-10 MED ORDER — TECHNETIUM TC 99M TETROFOSMIN IV KIT
11.0000 | PACK | Freq: Once | INTRAVENOUS | Status: AC | PRN
  Administered 2011-03-10: 11 via INTRAVENOUS

## 2011-03-10 MED ORDER — REGADENOSON 0.4 MG/5ML IV SOLN
0.4000 mg | Freq: Once | INTRAVENOUS | Status: AC
  Administered 2011-03-10: 0.4 mg via INTRAVENOUS

## 2011-03-10 NOTE — Progress Notes (Signed)
[facility] SITE 3 NUCLEAR MED [address] [place] [address] [phone]  Cardiology Nuclear Med Study  Wesley [name] is a 77 y.o. male [id number] [birth year]   Nuclear Med Background Indication for Stress Test:  Evaluation for Ischemia, Surgical Clearance and Graft Patency History: '00 CABG: [place], 08/10 Echo:EF 60-65%, '00 Heart Catheterization:-CABG and 03/02/05 Myocardial Perfusion Study: EF 58% NL Cardiac Risk Factors: Carotid Disease, IDDM Type 2, Lipids and PVD  Symptoms:  DOE and SOB   Nuclear Pre-Procedure Caffeine/Decaff Intake:  None NPO After: 10:00pm   Lungs:  clear IV 0.9% NS with Angio Cath:  20g  IV Site: R Antecubital x 1, tolerated well IV Started by:  Irean Hong, RN  Chest Size (in):  44 Cup Size: n/a  Height: 6\' 2"  (1.88 m)  Weight:  218 lb (98.884 kg)  BMI:  Body mass index is 27.99 kg/(m^2). Tech Comments:  No insulin today    Nuclear Med Study 1 or 2 day study: 1 day  Stress Test Type:  Lexiscan  Reading MD: Olga Millers, MD  Order Authorizing Provider:  Cassell Clement, MD  Resting Radionuclide: Technetium 60m Tetrofosmin  Resting Radionuclide Dose: 11.0 mCi   Stress Radionuclide:  Technetium 50m Tetrofosmin  Stress Radionuclide Dose: 33.0 mCi           Stress Protocol Rest HR: 52 Stress HR: 71  Rest BP: 119/71 Stress BP: 158/67  Exercise Time (min): n/a METS: n/a   Predicted Max HR: 143 bpm % Max HR: 49.65 bpm Rate Pressure Product: 41324   Dose of Adenosine (mg):  n/a Dose of Lexiscan: 0.4 mg  Dose of Atropine (mg): n/a Dose of Dobutamine: n/a mcg/kg/min (at max HR)  Stress Test Technologist: Milana Na, EMT-P  Nuclear Technologist:  Domenic Polite, CNMT     Rest Procedure:  Myocardial perfusion imaging was performed at rest 45 minutes following the intravenous administration of Technetium 70m Tetrofosmin. Rest ECG: Sinus Bradycardia  Stress Procedure:  The patient received IV Lexiscan 0.4 mg  over 15-seconds.  Technetium 33m Tetrofosmin injected at 30-seconds.  There were no significant changes with Lexiscan. This patient became Lt. Headed, had bilateral weakness in arms, and nausea with Lexiscan.  Quantitative spect images were obtained after a 45 minute delay. Stress ECG: No significant ST segment change suggestive of ischemia.  QPS Raw Data Images:  Acquisition technically good; normal left ventricular size. Stress Images:  Normal homogeneous uptake in all areas of the myocardium. Rest Images:  Normal homogeneous uptake in all areas of the myocardium. Subtraction (SDS):  No evidence of ischemia. Transient Ischemic Dilatation (Normal <1.22):  1.08 Lung/Heart Ratio (Normal <0.45):  0.32  Quantitative Gated Spect Images QGS EDV:  102 ml QGS ESV:  41 ml QGS cine images:  NL LV Function; NL Wall Motion QGS EF: 60%  Impression Exercise Capacity:  Lexiscan with no exercise. BP Response:  Normal blood pressure response. Clinical Symptoms:  No chest pain. ECG Impression:  No significant ST segment change suggestive of ischemia. Comparison with Prior Nuclear Study: No images to compare  Overall Impression:  Normal stress nuclear study with no ischemia or infarction.  Olga Millers

## 2011-03-15 ENCOUNTER — Telehealth: Payer: Self-pay | Admitting: *Deleted

## 2011-03-15 NOTE — Telephone Encounter (Signed)
Message copied by Burnell Blanks on Tue Mar 15, 2011  5:26 PM ------      Message from: Cassell Clement      Created: Fri Mar 11, 2011  5:34 PM       Please report.  The stress test was normal.  The ejection fraction was 60%, which is normal.  There was no ischemia.  From the heart standpoint.  He is okay for surgery.

## 2011-03-15 NOTE — Telephone Encounter (Signed)
Advised of results

## 2011-03-15 NOTE — Progress Notes (Signed)
Advised patient

## 2011-03-30 ENCOUNTER — Other Ambulatory Visit: Payer: Self-pay | Admitting: Urology

## 2011-03-30 ENCOUNTER — Ambulatory Visit (HOSPITAL_COMMUNITY)
Admission: RE | Admit: 2011-03-30 | Discharge: 2011-03-30 | Disposition: A | Discharge status: Home / Self Care | Payer: Medicare Other | Source: Ambulatory Visit | Attending: Urology | Admitting: Urology

## 2011-03-30 ENCOUNTER — Encounter (HOSPITAL_COMMUNITY): Payer: Medicare Other

## 2011-03-30 DIAGNOSIS — Z01818 Encounter for other preprocedural examination: Secondary | ICD-10-CM

## 2011-03-30 DIAGNOSIS — J4489 Other specified chronic obstructive pulmonary disease: Secondary | ICD-10-CM | POA: Insufficient documentation

## 2011-03-30 DIAGNOSIS — J449 Chronic obstructive pulmonary disease, unspecified: Secondary | ICD-10-CM | POA: Insufficient documentation

## 2011-03-30 DIAGNOSIS — Z01812 Encounter for preprocedural laboratory examination: Secondary | ICD-10-CM | POA: Insufficient documentation

## 2011-03-30 DIAGNOSIS — N201 Calculus of ureter: Secondary | ICD-10-CM | POA: Insufficient documentation

## 2011-03-30 DIAGNOSIS — Z951 Presence of aortocoronary bypass graft: Secondary | ICD-10-CM | POA: Insufficient documentation

## 2011-03-30 LAB — BASIC METABOLIC PANEL
Chloride: 102 mEq/L (ref 96–112)
Creatinine, Ser: 1.42 mg/dL — ABNORMAL HIGH (ref 0.50–1.35)
GFR calc Af Amer: 53 mL/min — ABNORMAL LOW (ref >90)
GFR calc non Af Amer: 46 mL/min — ABNORMAL LOW (ref >90)

## 2011-03-30 LAB — APTT: aPTT: 35 seconds (ref 24–37)

## 2011-03-30 LAB — PROTIME-INR: INR: 2.09 — ABNORMAL HIGH (ref 0.00–1.49)

## 2011-03-30 LAB — CBC
HCT: 40.3 % (ref 39.0–52.0)
Hemoglobin: 13.6 g/dL (ref 13.0–17.0)
RDW: 13.4 % (ref 11.5–15.5)
WBC: 7.7 10*3/uL (ref 4.0–10.5)

## 2011-03-30 LAB — SURGICAL PCR SCREEN: MRSA, PCR: NEGATIVE

## 2011-04-05 ENCOUNTER — Ambulatory Visit (HOSPITAL_COMMUNITY)
Admission: RE | Admit: 2011-04-05 | Discharge: 2011-04-05 | Disposition: A | Discharge status: Home / Self Care | Payer: Medicare Other | Source: Ambulatory Visit | Attending: Urology | Admitting: Urology

## 2011-04-05 DIAGNOSIS — N201 Calculus of ureter: Secondary | ICD-10-CM | POA: Insufficient documentation

## 2011-04-05 DIAGNOSIS — Z86718 Personal history of other venous thrombosis and embolism: Secondary | ICD-10-CM | POA: Insufficient documentation

## 2011-04-05 DIAGNOSIS — Z951 Presence of aortocoronary bypass graft: Secondary | ICD-10-CM | POA: Insufficient documentation

## 2011-04-05 DIAGNOSIS — N1330 Unspecified hydronephrosis: Secondary | ICD-10-CM | POA: Insufficient documentation

## 2011-04-05 DIAGNOSIS — I69919 Unspecified symptoms and signs involving cognitive functions following unspecified cerebrovascular disease: Secondary | ICD-10-CM | POA: Insufficient documentation

## 2011-04-05 DIAGNOSIS — E119 Type 2 diabetes mellitus without complications: Secondary | ICD-10-CM | POA: Insufficient documentation

## 2011-04-05 DIAGNOSIS — I2510 Atherosclerotic heart disease of native coronary artery without angina pectoris: Secondary | ICD-10-CM | POA: Insufficient documentation

## 2011-04-05 LAB — PROTIME-INR: Prothrombin Time: 14.3 seconds (ref 11.6–15.2)

## 2011-04-05 NOTE — H&P (Signed)
[name], [name] NO.:  [id number]  MEDICAL RECORD NO.:  [id number]  LOCATION:  DAYL                         FACILITY:  [facility]  PHYSICIAN:  Excell Seltzer. Wesley [name], M.D.    DATE OF BIRTH:  Mar 08, 1932  DATE OF ADMISSION:  04/05/2011 DATE OF DISCHARGE:                             HISTORY & PHYSICAL   CHIEF COMPLAINT:  I have a left ureteral stone.  HISTORY:  [name] is a 77 year old, white male, former patient of Dr. Aldean Ast, with history of left ureteral stones.  He was initially managed with percutaneous treatment to left proximal stones in September 2011 and passage of an antegrade stent.  The patient had cystoscopy and attempted stent placement on August 2011 that was unsuccessful and required repeat percutaneous tube placement.  He had lithotripsy on January 07, 2010, which he tolerated well, but the stones did not pass and had been in place ever since.  He has been __________ stent exchanges and had his last in May of this year.  He was to have the stent exchanged sooner, but his wife was on hospice therapy for COPD and just passed away 3 weeks ago.  He has had prior ureteral calculi in late 2010 and had 3 separate ureteroscopies for stones and __________.  He is having urinary frequency with the stents.  PAST HISTORY:  Pertinent for, 1. Prior MI. 2. Anxiety. 3. Depression. 4. Diabetes. 5. Hypercholesterolemia. 6. Insomnia. 7. Stones. 8. Sleep apnea. 9. Squamous cell carcinoma. 10.Stroke syndrome. 11.DVT.  SURGICAL HISTORY:  Pertinent for, 1. Carotid thromboendarterectomy. 2. Heart surgery. 3. Stone surgeries as noted above.  MEDICATIONS:  Include, 1. Coumadin, which was stopped 5 days ago. 2. Fosinopril. 3. Lantus. 4. Pravastatin. 5. Tylenol.  MEDICATION ALLERGIES: 1. DOXYCYCLINE. 2. NIACIN.  FAMILY HISTORY:  Significant for strokes and thrombosis.  SOCIAL HISTORY:  He denies tobacco or alcohol.  He is recently widowed. He is  retired.  REVIEW OF SYSTEMS:  He has urinary frequency and urgency with burning and weak stream.  He has night sweats and easy fatigue.  He has a tendency to bruise easily and had a recent fall about 3 weeks ago with significant bruising in the left leg.  He has some lower extremity edema.  He has back pain and joint pain from arthritis.  He has had some dizziness.  He has anxiety and depression.  He is otherwise without complaints.  PHYSICAL EXAMINATION:  VITAL SIGNS:  Blood pressure is 179/76, heart rate 60, temperature 97.9. GENERAL:  He is well-developed, elderly white male, in no acute distress.  Alert and oriented x3. HEAD AND FACE:  Normocephalic, atraumatic. NECK:  Supple without thyromegaly or bruits. LUNGS:  Clear with normal effort. HEART:  Regular rate and rhythm. ABDOMEN:  Soft and flat. EXTREMITIES:  Have full range of motion. SKIN:  Warm and dry.  There is bruising on the left leg.  ASSESSMENT:  Left proximal ureteral stone with chronic stents.  PLAN:  I am going to attempt the left ureteroscopic stone extraction today and reassessment and potential trial without the stent.  If the stone is embedded and he has a significant stricture, then we may need  to consider a more definitive procedure for management of the ureteral abnormality.     Excell Seltzer. Wesley [name], M.D.     JJW/MEDQ  D:  04/05/2011  T:  04/05/2011  Job:  [id number]  cc:   Tammy R. Collins Scotland, M.D. Fax: [phone]  Electronically Signed by Bjorn Pippin M.D. on 04/05/2011 11:02:02 AM

## 2011-04-10 NOTE — Op Note (Signed)
NAMEEDVIN, ALBUS NO.:  [id number]  MEDICAL RECORD NO.:  [id number]  LOCATION:  DAYL                         FACILITY:  [facility]  PHYSICIAN:  Excell Seltzer. Annabell Howells, M.D.    DATE OF BIRTH:  1932/05/09  DATE OF PROCEDURE: DATE OF DISCHARGE:                              OPERATIVE REPORT   PROCEDURE: 1. Cystoscopy. 2. Left ureteroscopic endoureterotomy with laser and left     ureteroscopic stone extraction with laser. 3. Placement of left endopyelotomy double-J stent.  PREOPERATIVE DIAGNOSIS:  Left proximal ureteral stone and stricture.  POSTOPERATIVE DIAGNOSIS:  Left proximal ureteral stone and stricture. The stone was submucosal.  SURGEON:  Excell Seltzer. Annabell Howells, M.D.  ANESTHESIA:  General.  SPECIMEN:  Old stent, which was discarded.  DRAINS:  14/6-French 26 cm endopyelotomy stent.  BLOOD LOSS:  None.  COMPLICATIONS:  None.  INDICATIONS:  Wesley [name] is a 77 year old white male with complicated history of management of the left proximal ureteral stone that was treated with lithotripsy, attempted ureteroscopy, and a percutaneous nephrostomy with antegrade stent placement.  He has been managed with stent changes for almost a year and now has come under my care and is to undergo an attempted stone removal and stricture management.  FINDINGS OF PROCEDURE:  He was given Cipro.  He was taken to the operating room, where PAS hoses were fitted.  A general anesthetic was induced.  He was placed in lithotomy position.  His perineum and genitalia were prepped with Betadine solution.  He was draped in usual sterile fashion.  Time-out was performed.  Cystoscopy was performed using a 22-French scope and 12-degree lens. Examination revealed a normal urethra.  The external sphincter was intact.  The prostatic urethra was approximately 3 cm in length with bilobar hyperplasia with some obstruction.  Examination of bladder revealed some erythema and area of the left trigone from  the stent, but no other abnormalities.  The right orifice was normal.  The left orifice had an encrusted stent loop present.  The stent loop was grasped with a grasping forceps and pulled to the urethral meatus and a sensor guidewire was then passed through the stent to the kidney without difficulty.  At this point, a 6-French short ureteroscope was inserted alongside the wire.  It passed easily up the dilated ureter; however, I could not get it to the level of the stone which was noted on fluoroscopy.  The guidewire was then passed through the ureteroscope and attempt to have a second wire up the ureter, but it coiled just below the stone.  At this point, a 12-French introducer sheath dilator core was passed over the wire to dilate the distal ureter.  The core was then reassembled with the access sheath and passed far proximally as possible.  At this point, the second wire and the core removed and the digital ureteroscope was then passed through the sheath.  Inspection of the ureter with the digital scope revealed a tight stricture just below the level of stone was noted on fluoroscopy; however, even with the ureteroscope, I was unable to advance a second wire across the stricture.  At this point, ureteroscope and access sheath were  removed and a 4 cm 15- French balloon dilation catheter was passed across the strictured area and inflated to 20 atmospheres.  The waist disappeared and the balloon was left inflated for 2 minutes and then deflated and removed.  At this point, the flexible ureteroscope was then inserted per urethra and advanced up the ureteral orifice.  A second guidewire was then passed through the scope and this time was passed to the kidney.  At this point, the access sheath was reinserted, the core and working wire were removed leaving the safety wire in place and a 6-French scope was then passed through the sheath to the level of the stricture.  A 200 micron  laser fiber was then advanced.  It was set on 0.5 watts and 5 Hz initially and the stricture was incised in the 6 o'clock position. After the initial incision, I was able to advance the scope above the stricture and no stone was identified.  Using fluoroscopy, I was able to determine that the stone was below the level of the tip of the ureteroscope suggesting that it was submucosal.  At this point, I then continued the incision of the dense stricture in the proximal ureter and during this the stone became exposed.  I was then able to engage the stone with a 200 micron laser fiber and eventually increased the power at the right to 10 Hz.  The stone was fragmented into tiny fragments, which flushed out through the sheath and eventually using combination of visual inspection fluoroscopy it was felt that the bulk of the stone had been removed.  At this point, additional incision of the stricture was performed with a 200 micron laser fiber.  A safety wire was then inserted back through the ureteroscope into the kidney and the access sheath core was then inserted over the wire through the sheath and the sheath was advanced more proximally, however, I did not get it through the stricture due to the lack of length. However, at this point, it was felt that the stricture had been adequately incised and no significant residual stone material remained either at the site of the stricture or in the kidney as all the tiny fragments had flushed out none.  At this point, the access sheath and working wire were removed, and a 14/6-French 26 cm endopyelotomy stent was then passed with large end proximal over the wire under fluoroscopic guidance.  Once the stent was in the urethra, the cystoscope was backloaded and used aid placement.  Once the stent was in good position, the wire was removed leaving good coil in the kidney and good coil in the bladder.  The bladder was then drained.  The cystoscope  was removed.  The patient was taken down from lithotomy position and was moved to recovery room in stable condition. There were no complications.     Excell Seltzer. Annabell Howells, M.D.     JJW/MEDQ  D:  04/05/2011  T:  04/05/2011  Job:  [id number]  cc:   Tammy R. Collins Scotland, M.D. Fax: [phone]  Electronically Signed by Wesley [name] M.D. on 04/10/2011 10:02:56 AM

## 2011-08-30 ENCOUNTER — Other Ambulatory Visit: Payer: Self-pay | Admitting: Urology

## 2011-08-30 DIAGNOSIS — N1330 Unspecified hydronephrosis: Secondary | ICD-10-CM

## 2011-09-21 ENCOUNTER — Inpatient Hospital Stay (HOSPITAL_COMMUNITY)
Admission: RE | Admit: 2011-09-21 | Discharge: 2011-09-21 | Payer: Medicare Other | Source: Ambulatory Visit | Attending: Urology | Admitting: Urology

## 2011-09-26 ENCOUNTER — Other Ambulatory Visit: Payer: Self-pay | Admitting: Urology

## 2011-09-26 DIAGNOSIS — N1330 Unspecified hydronephrosis: Secondary | ICD-10-CM

## 2011-09-28 ENCOUNTER — Encounter (HOSPITAL_COMMUNITY)
Admission: RE | Admit: 2011-09-28 | Discharge: 2011-09-28 | Disposition: A | Discharge status: Home / Self Care | Payer: Medicare Other | Source: Ambulatory Visit | Attending: Urology | Admitting: Urology

## 2011-09-28 DIAGNOSIS — N1330 Unspecified hydronephrosis: Secondary | ICD-10-CM | POA: Insufficient documentation

## 2011-09-28 MED ORDER — FUROSEMIDE 10 MG/ML IJ SOLN
60.0000 mg | Freq: Once | INTRAMUSCULAR | Status: DC
  Filled 2011-09-28: qty 6

## 2011-09-28 MED ORDER — TECHNETIUM TC 99M MERTIATIDE
15.0000 | Freq: Once | INTRAVENOUS | Status: AC | PRN
  Administered 2011-09-28: 15 via INTRAVENOUS

## 2012-01-17 ENCOUNTER — Other Ambulatory Visit: Payer: Self-pay | Admitting: Cardiology

## 2012-01-17 ENCOUNTER — Other Ambulatory Visit: Payer: Self-pay | Admitting: *Deleted

## 2012-01-17 ENCOUNTER — Encounter (INDEPENDENT_AMBULATORY_CARE_PROVIDER_SITE_OTHER): Payer: Medicare Other

## 2012-01-17 DIAGNOSIS — I739 Peripheral vascular disease, unspecified: Secondary | ICD-10-CM

## 2012-01-17 DIAGNOSIS — E1159 Type 2 diabetes mellitus with other circulatory complications: Secondary | ICD-10-CM

## 2012-01-18 ENCOUNTER — Ambulatory Visit (INDEPENDENT_AMBULATORY_CARE_PROVIDER_SITE_OTHER): Payer: Medicare Other | Admitting: Cardiovascular Disease

## 2012-01-18 ENCOUNTER — Encounter: Payer: Self-pay | Admitting: Cardiovascular Disease

## 2012-01-18 VITALS — BP 160/80 | HR 63 | Ht 73.0 in | Wt 227.0 lb

## 2012-01-18 DIAGNOSIS — I739 Peripheral vascular disease, unspecified: Secondary | ICD-10-CM

## 2012-01-18 DIAGNOSIS — I2510 Atherosclerotic heart disease of native coronary artery without angina pectoris: Secondary | ICD-10-CM

## 2012-01-18 NOTE — Patient Instructions (Addendum)
Your physician wants you to follow-up in: 1 year.    You will receive a reminder letter in the mail two months in advance. If you don't receive a letter, please call our office to schedule the follow-up appointment.  Your physician has requested that you have an ankle brachial index (ABI) in one year. During this test an ultrasound and blood pressure cuff are used to evaluate the arteries that supply the arms and legs with blood. Allow thirty minutes for this exam. There are no restrictions or special instructions.

## 2012-01-20 NOTE — Assessment & Plan Note (Signed)
It is difficult to determine how symptomatic the patient is from the PAD standpoint given his limited mobility and and unsteady gait. Based on his ABI on exam, his PAD does not appear to be severe and does not explain his symptoms of unsteady gait and balance problems which seems to be more neurologic in nature. There is no signs of critical limb ischemia. Due to all of that, no further intervention for this as needed. I recommend continuing medical therapy for atherosclerosis. I advised him to consider starting a water aerobic exercise to improve his strength and balance. I will have him followup in one year with an ABI at that time.

## 2012-01-20 NOTE — Progress Notes (Signed)
HPI  This pleasant 78 year old gentleman who is here today for evaluation of peripheral arterial disease.He has a history of deep vein thrombosis and bilateral pulmonary emboli 2 years ago for which she was hospitalized and started on Coumadin. He presented at that time with syncope.  The patient has a past history of known ischemic heart disease. He underwent coronary artery bypass graft surgery in 2000. The surgery was done in [place]. Patient has a long history of hypercholesterolemia and diabetes. He suffers from progressive loss of memory. He also reports previous stroke with significant weakness and unsteady gait. He reports significant limitation in mobility. It appears that she has known history of peripheral arterial disease. Given that his mobility is very limited, he reports no claudication or discomfort but mostly unsteady gait. He denies any lower extremity ulceration or wounds. He had an ABI done recently which was mildly reduced bilaterally. There was evidence of tibial disease bilaterally.  Allergies  Allergen Reactions  . Doxycycline     swelling  . Niacin And Related Other (See Comments)    Hot flashes     Current Outpatient Prescriptions on File Prior to Visit  Medication Sig Dispense Refill  . fosinopril (MONOPRIL) 40 MG tablet Take 40 mg by mouth daily.        . insulin glargine (LANTUS) 100 UNIT/ML injection Inject into the skin at bedtime. 25 units daily      . pravastatin (PRAVACHOL) 40 MG tablet Take 40 mg by mouth 2 (two) times daily.       Marland Kitchen warfarin (COUMADIN) 5 MG tablet Take 5 mg by mouth daily. As directed by Dr. Collins Scotland          Past Medical History  Diagnosis Date  . Diabetes mellitus   . Hyperlipidemia   . Carotid artery occlusion   . Calf DVT (deep venous thrombosis)     hx of right calf  . PVD (peripheral vascular disease)     hx of  . Coronary artery disease 2000    mi  . Hx pulmonary embolism     bilateral     Past Surgical History    Procedure Date  . Coronary artery bypass graft 2000    4 vessel cabg in [place], [place].  . Cardiac catheterization   . Carotid endarterectomy 2000    bilateral     Family History  Problem Relation Age of Onset  . Stroke Mother     age 54  . Other Father     pulmonary embolus age 30  . Diabetes Sister   . Stroke Sister   . Diabetes Brother     brother was murdered  . Diabetes Sister      History   Social History  . Marital Status: Married    Spouse Name: N/A    Number of Children: N/A  . Years of Education: N/A   Occupational History  . Not on file.   Social History Main Topics  . Smoking status: Never Smoker   . Smokeless tobacco: Not on file  . Alcohol Use:   . Drug Use:   . Sexually Active:    Other Topics Concern  . Not on file   Social History Narrative  . No narrative on file      PHYSICAL EXAM   BP 160/80  Pulse 63  Ht 6\' 1"  (1.854 m)  Wt 227 lb (102.967 kg)  BMI 29.95 kg/m2 Constitutional: He is oriented to person, place, and time.  He appears well-developed and well-nourished. No distress.  HENT: No nasal discharge.  Head: Normocephalic and atraumatic.  Eyes: Pupils are equal and round. Right eye exhibits no discharge. Left eye exhibits no discharge.  Neck: Normal range of motion. Neck supple. No JVD present. No thyromegaly present.  Cardiovascular: Normal rate, regular rhythm, normal heart sounds and. Exam reveals no gallop and no friction rub. No murmur heard.  Pulmonary/Chest: Effort normal and breath sounds normal. No stridor. No respiratory distress. He has no wheezes. He has no rales. He exhibits no tenderness.  Abdominal: Soft. Bowel sounds are normal. He exhibits no distension. There is no tenderness. There is no rebound and no guarding.  Musculoskeletal: Normal range of motion. He exhibits no edema and no tenderness.  Neurological: He is alert and oriented to person, place, and time. Coordination normal.  Skin: Skin is warm and  dry. No rash noted. He is not diaphoretic. No erythema. No pallor.  Psychiatric: He has a normal mood and affect. His behavior is normal. Judgment and thought content normal.  Vascular: The patient could not get to the exam table and requested to be examined when he is sitting in the chair. Femoral pulses were palpable. DP: +1 bilaterally.     EKG: Normal sinus rhythm   ASSESSMENT AND PLAN

## 2012-04-06 HISTORY — PX: CARDIAC CATHETERIZATION: SHX172

## 2012-04-29 ENCOUNTER — Inpatient Hospital Stay (HOSPITAL_COMMUNITY)
Admission: EM | Admit: 2012-04-29 | Discharge: 2012-05-07 | DRG: 251 | Disposition: A | Discharge status: Skilled Nursing Facility | Payer: Medicare Other | Attending: Cardiology | Admitting: Cardiology

## 2012-04-29 ENCOUNTER — Encounter (HOSPITAL_COMMUNITY): Payer: Self-pay | Admitting: *Deleted

## 2012-04-29 DIAGNOSIS — Z86711 Personal history of pulmonary embolism: Secondary | ICD-10-CM

## 2012-04-29 DIAGNOSIS — I10 Essential (primary) hypertension: Secondary | ICD-10-CM

## 2012-04-29 DIAGNOSIS — Z86718 Personal history of other venous thrombosis and embolism: Secondary | ICD-10-CM

## 2012-04-29 DIAGNOSIS — Z79899 Other long term (current) drug therapy: Secondary | ICD-10-CM

## 2012-04-29 DIAGNOSIS — I2089 Other forms of angina pectoris: Secondary | ICD-10-CM

## 2012-04-29 DIAGNOSIS — I129 Hypertensive chronic kidney disease with stage 1 through stage 4 chronic kidney disease, or unspecified chronic kidney disease: Secondary | ICD-10-CM | POA: Diagnosis present

## 2012-04-29 DIAGNOSIS — R7989 Other specified abnormal findings of blood chemistry: Secondary | ICD-10-CM

## 2012-04-29 DIAGNOSIS — I824Z9 Acute embolism and thrombosis of unspecified deep veins of unspecified distal lower extremity: Secondary | ICD-10-CM | POA: Diagnosis present

## 2012-04-29 DIAGNOSIS — Z8673 Personal history of transient ischemic attack (TIA), and cerebral infarction without residual deficits: Secondary | ICD-10-CM

## 2012-04-29 DIAGNOSIS — I2510 Atherosclerotic heart disease of native coronary artery without angina pectoris: Secondary | ICD-10-CM

## 2012-04-29 DIAGNOSIS — Z951 Presence of aortocoronary bypass graft: Secondary | ICD-10-CM

## 2012-04-29 DIAGNOSIS — Z7902 Long term (current) use of antithrombotics/antiplatelets: Secondary | ICD-10-CM

## 2012-04-29 DIAGNOSIS — Z823 Family history of stroke: Secondary | ICD-10-CM

## 2012-04-29 DIAGNOSIS — E119 Type 2 diabetes mellitus without complications: Secondary | ICD-10-CM | POA: Diagnosis present

## 2012-04-29 DIAGNOSIS — Z7901 Long term (current) use of anticoagulants: Secondary | ICD-10-CM

## 2012-04-29 DIAGNOSIS — I200 Unstable angina: Secondary | ICD-10-CM

## 2012-04-29 DIAGNOSIS — I208 Other forms of angina pectoris: Secondary | ICD-10-CM

## 2012-04-29 DIAGNOSIS — N183 Chronic kidney disease, stage 3 unspecified: Secondary | ICD-10-CM

## 2012-04-29 DIAGNOSIS — Z794 Long term (current) use of insulin: Secondary | ICD-10-CM

## 2012-04-29 DIAGNOSIS — Z833 Family history of diabetes mellitus: Secondary | ICD-10-CM

## 2012-04-29 DIAGNOSIS — I214 Non-ST elevation (NSTEMI) myocardial infarction: Principal | ICD-10-CM

## 2012-04-29 HISTORY — DX: Ischemic cardiomyopathy: I25.5

## 2012-04-29 HISTORY — DX: Cerebral infarction, unspecified: I63.9

## 2012-04-29 LAB — GLUCOSE, CAPILLARY: Glucose-Capillary: 225 mg/dL — ABNORMAL HIGH (ref 70–99)

## 2012-04-29 LAB — POCT I-STAT TROPONIN I

## 2012-04-29 LAB — CBC
HCT: 43.7 % (ref 39.0–52.0)
Hemoglobin: 15.8 g/dL (ref 13.0–17.0)
MCH: 31.6 pg (ref 26.0–34.0)
MCV: 87.4 fL (ref 78.0–100.0)
Platelets: 166 10*3/uL (ref 150–400)
RBC: 5 MIL/uL (ref 4.22–5.81)
WBC: 8.3 10*3/uL (ref 4.0–10.5)

## 2012-04-29 LAB — BASIC METABOLIC PANEL
BUN: 23 mg/dL (ref 6–23)
CO2: 26 mEq/L (ref 19–32)
Calcium: 9.7 mg/dL (ref 8.4–10.5)
Chloride: 99 mEq/L (ref 96–112)
Creatinine, Ser: 1.5 mg/dL — ABNORMAL HIGH (ref 0.50–1.35)
Glucose, Bld: 269 mg/dL — ABNORMAL HIGH (ref 70–99)

## 2012-04-29 LAB — PROTIME-INR: Prothrombin Time: 21.4 seconds — ABNORMAL HIGH (ref 11.6–15.2)

## 2012-04-29 MED ORDER — ACETAMINOPHEN 500 MG PO TABS
500.0000 mg | ORAL_TABLET | Freq: Every evening | ORAL | Status: DC | PRN
  Administered 2012-04-29: 500 mg via ORAL
  Filled 2012-04-29: qty 1

## 2012-04-29 MED ORDER — SODIUM CHLORIDE 0.9 % IJ SOLN
3.0000 mL | Freq: Two times a day (BID) | INTRAMUSCULAR | Status: DC
  Administered 2012-04-30: 3 mL via INTRAVENOUS

## 2012-04-29 MED ORDER — SODIUM CHLORIDE 0.9 % IV SOLN: 250.0000 mL | INTRAVENOUS | Status: DC | PRN

## 2012-04-29 MED ORDER — ASPIRIN EC 81 MG PO TBEC
81.0000 mg | DELAYED_RELEASE_TABLET | Freq: Every day | ORAL | Status: DC
  Administered 2012-04-30: 81 mg via ORAL
  Filled 2012-04-29 (×2): qty 1

## 2012-04-29 MED ORDER — ASPIRIN 81 MG PO CHEW
324.0000 mg | CHEWABLE_TABLET | Freq: Once | ORAL | Status: AC
  Administered 2012-04-29: 324 mg via ORAL
  Filled 2012-04-29: qty 4

## 2012-04-29 MED ORDER — SODIUM CHLORIDE 0.9 % IJ SOLN: 3.0000 mL | INTRAMUSCULAR | Status: DC | PRN

## 2012-04-29 MED ORDER — ASPIRIN 81 MG PO CHEW: 324.0000 mg | CHEWABLE_TABLET | ORAL | Status: AC

## 2012-04-29 MED ORDER — INSULIN ASPART 100 UNIT/ML ~~LOC~~ SOLN
0.0000 [IU] | Freq: Three times a day (TID) | SUBCUTANEOUS | Status: DC
  Administered 2012-05-01 – 2012-05-02 (×2): 2 [IU] via SUBCUTANEOUS
  Administered 2012-05-02 (×2): 3 [IU] via SUBCUTANEOUS
  Administered 2012-05-03: 2 [IU] via SUBCUTANEOUS
  Administered 2012-05-03: 8 [IU] via SUBCUTANEOUS
  Administered 2012-05-04: 5 [IU] via SUBCUTANEOUS
  Administered 2012-05-04: 3 [IU] via SUBCUTANEOUS
  Administered 2012-05-04: 5 [IU] via SUBCUTANEOUS
  Administered 2012-05-05 – 2012-05-06 (×4): 3 [IU] via SUBCUTANEOUS
  Administered 2012-05-06: 2 [IU] via SUBCUTANEOUS
  Administered 2012-05-06: 3 [IU] via SUBCUTANEOUS
  Administered 2012-05-07: 2 [IU] via SUBCUTANEOUS
  Administered 2012-05-07: 5 [IU] via SUBCUTANEOUS

## 2012-04-29 MED ORDER — NITROGLYCERIN 0.4 MG SL SUBL: 0.4000 mg | SUBLINGUAL_TABLET | SUBLINGUAL | Status: DC | PRN

## 2012-04-29 MED ORDER — ONDANSETRON HCL 4 MG/2ML IJ SOLN: 4.0000 mg | Freq: Four times a day (QID) | INTRAMUSCULAR | Status: DC | PRN

## 2012-04-29 MED ORDER — OMEGA-3-ACID ETHYL ESTERS 1 G PO CAPS
2.0000 g | ORAL_CAPSULE | Freq: Every day | ORAL | Status: DC
  Administered 2012-04-29 – 2012-05-07 (×9): 2 g via ORAL
  Filled 2012-04-29 (×10): qty 2

## 2012-04-29 MED ORDER — DIPHENHYDRAMINE HCL 25 MG PO CAPS
25.0000 mg | ORAL_CAPSULE | Freq: Every evening | ORAL | Status: DC | PRN
  Administered 2012-04-29: 25 mg via ORAL
  Filled 2012-04-29: qty 1

## 2012-04-29 MED ORDER — NITROGLYCERIN IN D5W 200-5 MCG/ML-% IV SOLN
2.0000 ug/min | INTRAVENOUS | Status: DC
  Administered 2012-04-29: 10 ug/min via INTRAVENOUS
  Filled 2012-04-29: qty 250

## 2012-04-29 MED ORDER — HEPARIN (PORCINE) IN NACL 100-0.45 UNIT/ML-% IJ SOLN
1200.0000 [IU]/h | INTRAMUSCULAR | Status: DC
  Administered 2012-04-29: 1450 [IU]/h via INTRAVENOUS
  Administered 2012-04-30: 1400 [IU]/h via INTRAVENOUS
  Administered 2012-04-30: 1200 [IU]/h via INTRAVENOUS
  Filled 2012-04-29 (×3): qty 250

## 2012-04-29 MED ORDER — CARVEDILOL 3.125 MG PO TABS
3.1250 mg | ORAL_TABLET | Freq: Two times a day (BID) | ORAL | Status: DC
  Administered 2012-04-30: 3.125 mg via ORAL
  Filled 2012-04-29 (×3): qty 1

## 2012-04-29 MED ORDER — ASPIRIN 81 MG PO CHEW
324.0000 mg | CHEWABLE_TABLET | ORAL | Status: AC
  Administered 2012-04-30: 324 mg via ORAL
  Filled 2012-04-29: qty 4

## 2012-04-29 MED ORDER — SODIUM CHLORIDE 0.9 % IV SOLN
INTRAVENOUS | Status: DC
  Administered 2012-04-30: 10:00:00 via INTRAVENOUS
  Administered 2012-05-01: 50 mL/h via INTRAVENOUS

## 2012-04-29 MED ORDER — ACETAMINOPHEN 325 MG PO TABS: 650.0000 mg | ORAL_TABLET | ORAL | Status: DC | PRN

## 2012-04-29 MED ORDER — ASPIRIN 300 MG RE SUPP
300.0000 mg | RECTAL | Status: AC
  Filled 2012-04-29: qty 1

## 2012-04-29 MED ORDER — SIMVASTATIN 40 MG PO TABS
40.0000 mg | ORAL_TABLET | Freq: Every day | ORAL | Status: DC
  Administered 2012-04-30 – 2012-05-07 (×8): 40 mg via ORAL
  Filled 2012-04-29 (×9): qty 1

## 2012-04-29 NOTE — H&P (Signed)
Hospital Admission Note Date: 04/29/2012  Patient name: Wesley [name] Medical record number: [id number] Date of birth: 1931/12/31 Age: 78 y.o. Gender: male PCP: Herb Grays, MD Primary Cardiologist: Dr. Patty Sermons  Medical Service: Cardiology  Chief Complaint:  Chest pain  History of Present Illness: The patient is a 78 YO man with PMH of CABG in 2000, stress last 1 yr ago which was negative, DM. He also has history of PE on coumadin long term for that. He has onset of anginal chest pain with exertion starting 2 days prior to admission. Afternoon of admission sought medical attention due to his family. He is not having fevers/chills/cough/SOB at home. No N/V/D. No urinary symptoms. No PND, SOB, DOE. He states that this is exactly like the chest pain he had with previous chest problems before his CABG in 2000. He states his sugars are well controlled at home with only lantus which he has been taking for 25 years +. He has history of bilateral CEA. He is not a smoker. He is currently not in pain.   Meds: Current Outpatient Rx  Name  Route  Sig  Dispense  Refill  . DIPHENHYDRAMINE-APAP (SLEEP) 25-500 MG PO TABS   Oral   Take 2 tablets by mouth at bedtime.         . INSULIN GLARGINE 100 UNIT/ML Brock SOLN   Subcutaneous   Inject 25 Units into the skin every morning.          Marland Kitchen FISH OIL PO   Oral   Take 2 capsules by mouth daily.         Marland Kitchen PRAVASTATIN SODIUM 40 MG PO TABS   Oral   Take 80 mg by mouth daily.          . WARFARIN SODIUM 5 MG PO TABS   Oral   Take 5 mg by mouth every evening. As directed by Dr. Collins Scotland           Allergies: Allergies as of 04/29/2012 - Review Complete 04/29/2012  Allergen Reaction Noted  . Doxycycline  02/21/2011  . Niacin and related Other (See Comments) 03/10/2011   Past Medical History  Diagnosis Date  . Diabetes mellitus   . Hyperlipidemia   . Carotid artery occlusion   . Calf DVT (deep venous thrombosis)     hx of right calf  . PVD  (peripheral vascular disease)     hx of  . Coronary artery disease 2000    mi  . Hx pulmonary embolism     bilateral  . Stroke    Past Surgical History  Procedure Date  . Coronary artery bypass graft 2000    4 vessel cabg in [place], [place].  . Cardiac catheterization   . Carotid endarterectomy 2000    bilateral   Family History  Problem Relation Age of Onset  . Stroke Mother     age 22  . Other Father     pulmonary embolus age 62  . Diabetes Sister   . Stroke Sister   . Diabetes Brother     brother was murdered  . Diabetes Sister    History   Social History  . Marital Status: Married    Spouse Name: N/A    Number of Children: N/A  . Years of Education: N/A   Occupational History  . Not on file.   Social History Main Topics  . Smoking status: Never Smoker   . Smokeless tobacco: Not on file  . Alcohol Use: No  .  Drug Use: No  . Sexually Active:    Other Topics Concern  . Not on file   Social History Narrative  . No narrative on file    Review of Systems: Pertinent items are noted in HPI.  Physical Exam: Blood pressure 164/70, pulse 73, temperature 98 F (36.7 C), temperature source Oral, resp. rate 22, SpO2 99.00%. General: resting in bed, alert and pleasant HEENT: PERRL, EOMI, no scleral icterus Cardiac: S1, S2 heard, no overt murmurs, no carotid bruits heard Pulm: clear to auscultation bilaterally, moving normal volumes of air Abd: soft, nontender, nondistended, BS present Ext: warm and well perfused, no pedal edema Neuro: alert and oriented X3, cranial nerves II-XII grossly intact   Lab results: Basic Metabolic Panel:  Basename 04/29/12 1449  NA 136  K 4.5  CL 99  CO2 26  GLUCOSE 269*  BUN 23  CREATININE 1.50*  CALCIUM 9.7  MG --  PHOS --   CBC:  Basename 04/29/12 1449  WBC 8.3  NEUTROABS --  HGB 15.8  HCT 43.7  MCV 87.4  PLT 166   Cardiac Enzymes: POC troponin in ED: 0.11 Coagulation:  Basename 04/29/12 1449    LABPROT 21.4*  INR 1.94*   Imaging results:  No results found.  Other results: EKG: Q waves in inferior leads, no ST elevation, no significant change from old EKG.  Assessment & Plan by Problem:   Elevated troponin - POC troponin 0.11, only slightly above baseline. Could be ACS versus ischemic demand. Does warrant catheterization tomorrow morning. Since pain has been going on since Friday troponin should be good estimate of myocardial damage. Given ASA in ED. Beta blocker ordered for blood pressure. On statin. Fasting lipid panel and TSH ordered and HgA1c. Trend CE overnight and EKG in the AM.    Anginal pain - Will use nitro prn for pain and trend CE. Will need cath tomorrow morning.    Coronary artery disease with history of CABG in 2000 - Recent stress was 1 yr ago and normal per patient. Will need cath this admission.   CKD stage IIIa - GFR around 50, stable from baseline. Will follow closely with dye from cath.    Hx pulmonary embolism - On coumadin chronically.   Hx of CVA - thought to be sequelae of PE and DVT.    Diabetes mellitus - Patient taking only lantus at home and will hold for cath in AM. Will use SSI as needed and glucose checks qac and qhs.   Hypertension, uncontrolled - start NTG drip  Signed: Genella Mech 04/29/2012, 4:46 PM   Patient seen and examined with Dr. Dorise Hiss. We discussed all aspects of the encounter. I agree with the assessment and plan as stated above.   CP concerning for progressive/unstable angina. Troponin minimally elevated. ECG unchanged. Will need cath once INR down. With renal insufficiency will need pre-cath hydration.  With INR < 2.0 would start heparin - no bolus. Start low-dose b-blocker as HR tolerates. NTG gtt for HTN and angina.  Will place on cath board.   Truman Hayward 5:28 PM

## 2012-04-29 NOTE — ED Notes (Signed)
Reports having left side chest pains that started on Friday but went away. Became worse today and increases when standing and walking. Denies any n/v or sob. No acute distress noted at this time.

## 2012-04-29 NOTE — ED Provider Notes (Addendum)
History     CSN: [id number]  Arrival date & time 04/29/12  1436   First MD Initiated Contact with Patient 04/29/12 1513      Chief Complaint  Patient presents with  . Chest Pain    (Consider location/radiation/quality/duration/timing/severity/associated sxs/prior treatment) Patient is a 78 y.o. male presenting with chest pain. The history is provided by the patient.  Chest Pain The chest pain began 3 - 5 days ago. Duration of episode(s) is 20 minutes. Chest pain occurs intermittently. The chest pain is worsening. The pain is associated with exertion. At its most intense, the pain is at 7/10. The pain is currently at 0/10. The severity of the pain is moderate. The quality of the pain is described as aching, heavy and tightness. The pain does not radiate. Pertinent negatives for primary symptoms include no syncope, no shortness of breath, no cough, no nausea and no vomiting.  Pertinent negatives for associated symptoms include no diaphoresis. He tried nothing for the symptoms. Risk factors include male gender.  His past medical history is significant for CAD, diabetes, hyperlipidemia, hypertension, MI and PE. Procedure history comments: Prior CABG in 2000.     Past Medical History  Diagnosis Date  . Diabetes mellitus   . Hyperlipidemia   . Carotid artery occlusion   . Calf DVT (deep venous thrombosis)     hx of right calf  . PVD (peripheral vascular disease)     hx of  . Coronary artery disease 2000    mi  . Hx pulmonary embolism     bilateral  . Stroke     Past Surgical History  Procedure Date  . Coronary artery bypass graft 2000    4 vessel cabg in [place], [place].  . Cardiac catheterization   . Carotid endarterectomy 2000    bilateral    Family History  Problem Relation Age of Onset  . Stroke Mother     age 27  . Other Father     pulmonary embolus age 41  . Diabetes Sister   . Stroke Sister   . Diabetes Brother     brother was murdered  . Diabetes Sister      History  Substance Use Topics  . Smoking status: Never Smoker   . Smokeless tobacco: Not on file  . Alcohol Use: No      Review of Systems  Constitutional: Negative for diaphoresis.  Respiratory: Negative for cough and shortness of breath.   Cardiovascular: Positive for chest pain. Negative for syncope.  Gastrointestinal: Negative for nausea and vomiting.  All other systems reviewed and are negative.    Allergies  Doxycycline and Niacin and related  Home Medications   Current Outpatient Rx  Name  Route  Sig  Dispense  Refill  . FOSINOPRIL SODIUM 40 MG PO TABS   Oral   Take 40 mg by mouth daily.           . INSULIN GLARGINE 100 UNIT/ML Luna SOLN   Subcutaneous   Inject into the skin at bedtime. 25 units daily         . PRAVASTATIN SODIUM 40 MG PO TABS   Oral   Take 40 mg by mouth 2 (two) times daily.          . WARFARIN SODIUM 5 MG PO TABS   Oral   Take 5 mg by mouth daily. As directed by Dr. Collins Scotland            BP 140/69  Pulse  73  Temp 98 F (36.7 C) (Oral)  SpO2 97%  Physical Exam  Nursing note and vitals reviewed. Constitutional: He is oriented to person, place, and time. He appears well-developed and well-nourished. No distress.  HENT:  Head: Normocephalic and atraumatic.  Mouth/Throat: Oropharynx is clear and moist.  Eyes: Conjunctivae normal and EOM are normal. Pupils are equal, round, and reactive to light.  Neck: Normal range of motion. Neck supple.  Cardiovascular: Normal rate, regular rhythm and intact distal pulses.   No murmur heard. Pulmonary/Chest: Effort normal and breath sounds normal. No respiratory distress. He has no wheezes. He has no rales.       Well-healed sternotomy scar  Abdominal: Soft. He exhibits no distension. There is no tenderness. There is no rebound and no guarding.  Musculoskeletal: Normal range of motion. He exhibits no edema and no tenderness.  Neurological: He is alert and oriented to person, place, and time.   Skin: Skin is warm and dry. No rash noted. No erythema.  Psychiatric: He has a normal mood and affect. His behavior is normal.    ED Course  Procedures (including critical care time)  Labs Reviewed  CBC - Abnormal; Notable for the following:    MCHC 36.2 (*)     All other components within normal limits  BASIC METABOLIC PANEL - Abnormal; Notable for the following:    Glucose, Bld 269 (*)     Creatinine, Ser 1.50 (*)     GFR calc non Af Amer 43 (*)     GFR calc Af Amer 50 (*)     All other components within normal limits  PROTIME-INR - Abnormal; Notable for the following:    Prothrombin Time 21.4 (*)     INR 1.94 (*)     All other components within normal limits  POCT I-STAT TROPONIN I - Abnormal; Notable for the following:    Troponin i, poc 0.11 (*)     All other components within normal limits   No results found.   Date: 04/29/2012  Rate: 81  Rhythm: normal sinus rhythm  QRS Axis: normal  Intervals: normal  ST/T Wave abnormalities: nonspecific ST/T changes  Conduction Disutrbances:none  Narrative Interpretation:   Old EKG Reviewed: unchanged    1. Stable angina       MDM   Pt with symptoms concerning for stable angina. States every time he walks or starts exerting himself he develops pain. When he sits down the pain resolved  TIMI 4. Associated symptoms include none.  Prior hx of PE and CVA. On coumadin and NTG held due to no pain currently. EKG unchanged, troponin borderline at .11. CXR, CBC, BMP, Coags pending.  4:02 PM INR subtherapeutic at 1.9. Patient given aspirin 325. CBC and BMP within normal limits. Patient consulted to cardiology for further care.       Gwyneth Sprout, MD 04/29/12 1603  Gwyneth Sprout, MD 04/29/12 1610

## 2012-04-29 NOTE — Progress Notes (Signed)
ANTICOAGULATION CONSULT NOTE - Initial Consult  Pharmacy Consult for Heparin Indication: hx PE  Allergies  Allergen Reactions  . Doxycycline     swelling  . Niacin And Related Other (See Comments)    Hot flashes   Weight: 103 kg Heparin Dosing Weight: 100.8 kg   Vital Signs: Temp: 98 F (36.7 C) (11/24 1445) Temp src: Oral (11/24 1445) BP: 164/70 mmHg (11/24 1619) Pulse Rate: 73  (11/24 1445)  Labs:  Basename 04/29/12 1449  HGB 15.8  HCT 43.7  PLT 166  APTT --  LABPROT 21.4*  INR 1.94*  HEPARINUNFRC --  CREATININE 1.50*  CKTOTAL --  CKMB --  TROPONINI --    The CrCl is unknown because both a height and weight (above a minimum accepted value) are required for this calculation.   Medical History: Past Medical History  Diagnosis Date  . Diabetes mellitus   . Hyperlipidemia   . Carotid artery occlusion   . Calf DVT (deep venous thrombosis)     hx of right calf  . PVD (peripheral vascular disease)     hx of  . Coronary artery disease 2000    mi  . Hx pulmonary embolism     bilateral  . Stroke     Medications:  See PTA medication list  Assessment: 78 y/o male with a hx PE on chronic Coumadin who presents to the [facility] ED with chest pain. Plan is for cath tomorrow. Pharmacy consulted to begin heparin while Coumadin on hold. INR is subtherapeutic at 1.94. CBC is normal.  Goal of Therapy:  Heparin level 0.3-0.7 units/ml Monitor platelets by anticoagulation protocol: Yes   Plan:  -Begin heparin drip at 1450 units/hr IV with no bolus -Heparin level 8 hours after started -Daily heparin level and CBC while on heparin  [facility], [place].D., BCPS Clinical Pharmacist Pager: [phone] 04/29/2012 5:46 PM

## 2012-04-29 NOTE — ED Notes (Signed)
MD notified at the bedside of pt critical lab value

## 2012-04-30 DIAGNOSIS — I209 Angina pectoris, unspecified: Secondary | ICD-10-CM

## 2012-04-30 LAB — BASIC METABOLIC PANEL
CO2: 27 mEq/L (ref 19–32)
Chloride: 103 mEq/L (ref 96–112)
Creatinine, Ser: 1.47 mg/dL — ABNORMAL HIGH (ref 0.50–1.35)
GFR calc Af Amer: 51 mL/min — ABNORMAL LOW (ref >90)
Potassium: 3.9 mEq/L (ref 3.5–5.1)
Sodium: 138 mEq/L (ref 135–145)

## 2012-04-30 LAB — GLUCOSE, CAPILLARY
Glucose-Capillary: 159 mg/dL — ABNORMAL HIGH (ref 70–99)
Glucose-Capillary: 161 mg/dL — ABNORMAL HIGH (ref 70–99)
Glucose-Capillary: 163 mg/dL — ABNORMAL HIGH (ref 70–99)

## 2012-04-30 LAB — LIPID PANEL
Cholesterol: 188 mg/dL (ref 0–200)
HDL: 36 mg/dL — ABNORMAL LOW (ref >39)
Total CHOL/HDL Ratio: 5.2 RATIO
Triglycerides: 129 mg/dL (ref <150)
VLDL: 26 mg/dL (ref 0–40)

## 2012-04-30 LAB — TROPONIN I: Troponin I: <0.3 ng/mL (ref <0.30)

## 2012-04-30 LAB — HEPARIN LEVEL (UNFRACTIONATED)
Heparin Unfractionated: 0.75 IU/mL — ABNORMAL HIGH (ref 0.30–0.70)
Heparin Unfractionated: 0.84 IU/mL — ABNORMAL HIGH (ref 0.30–0.70)

## 2012-04-30 LAB — CBC
HCT: 40.3 % (ref 39.0–52.0)
Hemoglobin: 14 g/dL (ref 13.0–17.0)
MCH: 30.9 pg (ref 26.0–34.0)
MCHC: 34.7 g/dL (ref 30.0–36.0)
MCV: 89 fL (ref 78.0–100.0)
RDW: 13.4 % (ref 11.5–15.5)

## 2012-04-30 MED ORDER — VITAMIN K1 10 MG/ML IJ SOLN
2.0000 mg | Freq: Once | INTRAVENOUS | Status: AC
  Administered 2012-04-30: 2 mg via INTRAVENOUS
  Filled 2012-04-30: qty 0.2

## 2012-04-30 MED ORDER — DIPHENHYDRAMINE HCL 25 MG PO CAPS
50.0000 mg | ORAL_CAPSULE | Freq: Every evening | ORAL | Status: DC | PRN
  Administered 2012-04-30 – 2012-05-06 (×4): 50 mg via ORAL
  Filled 2012-04-30 (×2): qty 2
  Filled 2012-04-30: qty 1
  Filled 2012-04-30: qty 2

## 2012-04-30 MED ORDER — ACETAMINOPHEN 500 MG PO TABS
1000.0000 mg | ORAL_TABLET | Freq: Every evening | ORAL | Status: DC | PRN
  Administered 2012-04-30 – 2012-05-06 (×6): 1000 mg via ORAL
  Filled 2012-04-30 (×6): qty 2

## 2012-04-30 MED ORDER — CARVEDILOL 6.25 MG PO TABS
6.2500 mg | ORAL_TABLET | Freq: Two times a day (BID) | ORAL | Status: DC
  Administered 2012-04-30 – 2012-05-07 (×15): 6.25 mg via ORAL
  Filled 2012-04-30 (×19): qty 1

## 2012-04-30 MED ORDER — HYDRALAZINE HCL 20 MG/ML IJ SOLN
10.0000 mg | INTRAMUSCULAR | Status: DC | PRN
  Filled 2012-04-30: qty 0.5

## 2012-04-30 NOTE — Progress Notes (Signed)
Utilization review completed.  

## 2012-04-30 NOTE — Progress Notes (Signed)
ANTICOAGULATION CONSULT NOTE - Follow Up Consult  Pharmacy Consult for Heparin Indication: Hx PE with reversed INR and new ACS sx while awaiting cath  Allergies  Allergen Reactions  . Doxycycline     swelling  . Niacin And Related Other (See Comments)    Hot flashes    Patient Measurements: Height: 6\' 2"  (188 cm) Weight: 229 lb 4.5 oz (104 kg) IBW/kg (Calculated) : 82.2  Heparin Dosing Weight: 103 kg  Vital Signs: Temp: 97.8 F (36.6 C) (11/25 1400) Temp src: Oral (11/25 1400) BP: 180/91 mmHg (11/25 1400) Pulse Rate: 59  (11/25 1400)  Labs:  Basename 04/30/12 1508 04/30/12 1225 04/30/12 0230 04/30/12 0218 04/29/12 2151 04/29/12 1449  HGB -- -- 14.0 -- -- 15.8  HCT -- -- 40.3 -- -- 43.7  PLT -- -- 143* -- -- 166  APTT -- -- -- -- -- --  LABPROT -- -- 25.2* -- -- 21.4*  INR -- -- 2.42* -- -- 1.94*  HEPARINUNFRC 0.84* -- 0.75* -- -- --  CREATININE -- -- 1.47* -- -- 1.50*  CKTOTAL -- -- -- -- -- --  CKMB -- -- -- -- -- --  TROPONINI -- <0.30 -- <0.30 <0.30 --    Estimated Creatinine Clearance: 53.2 ml/min (by C-G formula based on Cr of 1.47).   Medications:  Infusions:    . sodium chloride 50 mL/hr at 04/30/12 0942  . heparin 1,400 Units/hr (04/30/12 1244)  . nitroGLYCERIN 10 mcg/min (04/29/12 2115)    Assessment: 78 y.o. M on heparin while warfarin on hold for hx PE.  INR trended up to 2.42 today despite holding warfarin on 11/24 and the patient was given Vit K 2 mg x 1. Expected INR is likely <2. Heparin level this afternoon is SUPRAtherapeutic (HL 0.84, goal of 0.3-0.7). Hgb/Hct/Plt slight drop, no s/sx of bleeding noted.   Goal of Therapy:  Heparin level 0.3-0.7 units/ml Monitor platelets by anticoagulation protocol: Yes   Plan:  1. Decrease heparin at 1200 units/hr (12 ml/hr) 2. Will continue to monitor for any signs/symptoms of bleeding and will follow up with heparin level in 8 hours   Georgina Pillion, PharmD, BCPS Clinical Pharmacist Pager:  [phone] 04/30/2012 4:15 PM

## 2012-04-30 NOTE — Progress Notes (Signed)
ANTICOAGULATION CONSULT NOTE - Follow Up Consult  Pharmacy Consult for heparin Indication: h/o PE  Labs:  Basename 04/30/12 0230 04/30/12 0218 04/29/12 2151 04/29/12 1449  HGB 14.0 -- -- 15.8  HCT 40.3 -- -- 43.7  PLT 143* -- -- 166  APTT -- -- -- --  LABPROT 25.2* -- -- 21.4*  INR 2.42* -- -- 1.94*  HEPARINUNFRC 0.75* -- -- --  CREATININE 1.47* -- -- 1.50*  CKTOTAL -- -- -- --  CKMB -- -- -- --  TROPONINI -- <0.30 <0.30 --    Assessment: 78yo male slightly supratherapeutic on heparin with initial dosing for subtherapeutic INR; scheduled for cath at 1000 this am.  Goal of Therapy:  Heparin level 0.3-0.7 units/ml   Plan:  Will decrease heparin gtt slightly to 1400 units/hr and f/u after cath to resume.  Colleen Can PharmD BCPS 04/30/2012,6:53 AM

## 2012-04-30 NOTE — Progress Notes (Signed)
Subjective:  The patient is not having any chest discomfort this morning.  He remains on IV nitroglycerin and IV heparin.  His INR today is 2.42.  Objective:  Vital Signs in the last 24 hours: Temp:  [97.6 F (36.4 C)-98.4 F (36.9 C)] 98.4 F (36.9 C) (11/25 0600) Pulse Rate:  [51-73] 51  (11/25 0600) Resp:  [12-22] 12  (11/25 0600) BP: (134-182)/(57-122) 134/57 mmHg (11/25 0600) SpO2:  [97 %-100 %] 98 % (11/25 0600) Weight:  [229 lb 4.5 oz (104 kg)-229 lb 15 oz (104.3 kg)] 229 lb 4.5 oz (104 kg) (11/25 0600)  Intake/Output from previous day:   Intake/Output from this shift:       . [COMPLETED] aspirin  324 mg Oral Once  . aspirin  324 mg Oral NOW   Or  . aspirin  300 mg Rectal NOW  . [COMPLETED] aspirin  324 mg Oral Pre-Cath  . aspirin EC  81 mg Oral Daily  . carvedilol  3.125 mg Oral BID WC  . insulin aspart  0-15 Units Subcutaneous TID WC  . omega-3 acid ethyl esters  2 g Oral Daily  . phytonadione (VITAMIN K) IV  2 mg Intravenous Once  . simvastatin  40 mg Oral q1800  . sodium chloride  3 mL Intravenous Q12H      . sodium chloride 50 mL/hr at 04/29/12 2116  . heparin 1,400 Units/hr (04/30/12 0703)  . nitroGLYCERIN 10 mcg/min (04/29/12 2115)    Physical Exam: The patient appears to be in no distress.  Head and neck exam reveals that the pupils are equal and reactive.  The extraocular movements are full.  There is no scleral icterus.  Mouth and pharynx are benign.  No lymphadenopathy.  No carotid bruits.  The jugular venous pressure is normal.  Thyroid is not enlarged or tender.  Chest is clear to percussion and auscultation.  No rales or rhonchi.  Expansion of the chest is symmetrical.  Heart reveals no abnormal lift or heave.  First and second heart sounds are normal.  There is no murmur gallop rub or click.  The abdomen is soft and nontender.  Bowel sounds are normoactive.  There is no hepatosplenomegaly or mass.  There are no abdominal  bruits.  Extremities reveal no phlebitis or edema.  Pedal pulses are good.  There is no cyanosis or clubbing.  Neurologic exam is normal strength and no lateralizing weakness.  No sensory deficits.  Integument reveals no rash  Lab Results:  Basename 04/30/12 0230 04/29/12 1449  WBC 7.5 8.3  HGB 14.0 15.8  PLT 143* 166    Basename 04/30/12 0230 04/29/12 1449  NA 138 136  K 3.9 4.5  CL 103 99  CO2 27 26  GLUCOSE 131* 269*  BUN 20 23  CREATININE 1.47* 1.50*    Basename 04/30/12 0218 04/29/12 2151  TROPONINI <0.30 <0.30   Hepatic Function Panel No results found for this basename: PROT,ALBUMIN,AST,ALT,ALKPHOS,BILITOT,BILIDIR,IBILI in the last 72 hours  Basename 04/30/12 0230  CHOL 188   No results found for this basename: PROTIME in the last 72 hours  Imaging: Imaging results have been reviewed.  COPD with scarring.  Prior CABG.  No active disease  Cardiac Studies: Telemetry shows normal sinus rhythm Assessment/Plan:  Patient Active Hospital Problem List: Elevated troponin (04/29/2012)   Assessment: Subsequent troponins are less than 0.30   Coronary artery disease ()   Assessment: Cardiac catheterization    Plan: We'll need to delay cardiac catheterization until tomorrow  because of elevated INR .Will give 2 mg vitamin K intravenous now   Anginal pain (04/29/2012)   Assessment: No further chest pain since admission    Plan: Continue IV heparin and IV nitroglycerin, anticipate cardiac catheter in a.m.  Diabetes mellitus (04/29/2012)   Assessment: Hemoglobin A1c pending    Plan: Continue sliding scale insulin   CKD (chronic kidney disease), stage III (04/29/2012)   Assessment: Creatinine 1.47 with gentle hydration    Plan: Okay to proceed with cardiac catheterization in the a.m. recheck basal metabolic panel in a.m.  NSTEMI (non-ST elevated myocardial infarction) (04/29/2012)   Assessment: EKG stable  Hypertension, uncontrolled (04/29/2012)   Assessment: Blood  pressure controlled on current therapy  Plan: Will give him 2 mg of IV vitamin K now.  N.p.o. after midnight tonight for cath in a.m. Will change to inpatient status   LOS: 1 day    Wesley [name] 04/30/2012, 8:02 AM

## 2012-05-01 ENCOUNTER — Encounter (HOSPITAL_COMMUNITY)
Admission: EM | Disposition: A | Discharge status: Skilled Nursing Facility | Payer: Self-pay | Source: Home / Self Care | Attending: Cardiology

## 2012-05-01 DIAGNOSIS — I25810 Atherosclerosis of coronary artery bypass graft(s) without angina pectoris: Secondary | ICD-10-CM

## 2012-05-01 HISTORY — PX: LEFT HEART CATHETERIZATION WITH CORONARY/GRAFT ANGIOGRAM: SHX5450

## 2012-05-01 LAB — BASIC METABOLIC PANEL
BUN: 21 mg/dL (ref 6–23)
Creatinine, Ser: 1.43 mg/dL — ABNORMAL HIGH (ref 0.50–1.35)
GFR calc Af Amer: 53 mL/min — ABNORMAL LOW (ref >90)
GFR calc non Af Amer: 45 mL/min — ABNORMAL LOW (ref >90)
Glucose, Bld: 163 mg/dL — ABNORMAL HIGH (ref 70–99)
Potassium: 4.6 mEq/L (ref 3.5–5.1)

## 2012-05-01 LAB — CBC
Hemoglobin: 13.7 g/dL (ref 13.0–17.0)
MCH: 30.6 pg (ref 26.0–34.0)
MCHC: 34.9 g/dL (ref 30.0–36.0)
RDW: 13.1 % (ref 11.5–15.5)

## 2012-05-01 LAB — GLUCOSE, CAPILLARY
Glucose-Capillary: 153 mg/dL — ABNORMAL HIGH (ref 70–99)
Glucose-Capillary: 227 mg/dL — ABNORMAL HIGH (ref 70–99)
Glucose-Capillary: 190 mg/dL — ABNORMAL HIGH (ref 70–99)

## 2012-05-01 LAB — POCT ACTIVATED CLOTTING TIME: Activated Clotting Time: 484 seconds

## 2012-05-01 LAB — PROTIME-INR
INR: 1.39 (ref 0.00–1.49)
Prothrombin Time: 16.7 seconds — ABNORMAL HIGH (ref 11.6–15.2)

## 2012-05-01 LAB — HEPARIN LEVEL (UNFRACTIONATED): Heparin Unfractionated: 0.5 IU/mL (ref 0.30–0.70)

## 2012-05-01 SURGERY — LEFT HEART CATHETERIZATION WITH CORONARY/GRAFT ANGIOGRAM
Anesthesia: LOCAL

## 2012-05-01 MED ORDER — SODIUM CHLORIDE 0.9 % IJ SOLN: 3.0000 mL | Freq: Two times a day (BID) | INTRAMUSCULAR | Status: DC

## 2012-05-01 MED ORDER — ACETAMINOPHEN 325 MG PO TABS: 650.0000 mg | ORAL_TABLET | ORAL | Status: DC | PRN

## 2012-05-01 MED ORDER — CLOPIDOGREL BISULFATE 75 MG PO TABS
75.0000 mg | ORAL_TABLET | Freq: Every day | ORAL | Status: DC
  Administered 2012-05-02 – 2012-05-07 (×6): 75 mg via ORAL
  Filled 2012-05-01 (×8): qty 1

## 2012-05-01 MED ORDER — HEPARIN (PORCINE) IN NACL 2-0.9 UNIT/ML-% IJ SOLN
INTRAMUSCULAR | Status: AC
  Filled 2012-05-01: qty 1000

## 2012-05-01 MED ORDER — ONDANSETRON HCL 4 MG/2ML IJ SOLN: 4.0000 mg | Freq: Four times a day (QID) | INTRAMUSCULAR | Status: DC | PRN

## 2012-05-01 MED ORDER — SODIUM CHLORIDE 0.9 % IV SOLN: 250.0000 mL | INTRAVENOUS | Status: DC

## 2012-05-01 MED ORDER — MIDAZOLAM HCL 2 MG/2ML IJ SOLN
INTRAMUSCULAR | Status: AC
  Filled 2012-05-01: qty 2

## 2012-05-01 MED ORDER — ASPIRIN 81 MG PO CHEW
CHEWABLE_TABLET | ORAL | Status: AC
  Filled 2012-05-01: qty 4

## 2012-05-01 MED ORDER — VERAPAMIL HCL 2.5 MG/ML IV SOLN
INTRAVENOUS | Status: AC
  Filled 2012-05-01: qty 2

## 2012-05-01 MED ORDER — BIVALIRUDIN 250 MG IV SOLR
INTRAVENOUS | Status: AC
  Filled 2012-05-01: qty 250

## 2012-05-01 MED ORDER — HEPARIN SODIUM (PORCINE) 1000 UNIT/ML IJ SOLN
INTRAMUSCULAR | Status: AC
  Filled 2012-05-01: qty 1

## 2012-05-01 MED ORDER — NITROGLYCERIN 0.2 MG/ML ON CALL CATH LAB
INTRAVENOUS | Status: AC
  Filled 2012-05-01: qty 1

## 2012-05-01 MED ORDER — FENTANYL CITRATE 0.05 MG/ML IJ SOLN
INTRAMUSCULAR | Status: AC
  Filled 2012-05-01: qty 2

## 2012-05-01 MED ORDER — WARFARIN - PHARMACIST DOSING INPATIENT: Freq: Every day | Status: DC

## 2012-05-01 MED ORDER — SODIUM CHLORIDE 0.9 % IJ SOLN: 3.0000 mL | INTRAMUSCULAR | Status: DC | PRN

## 2012-05-01 MED ORDER — CLOPIDOGREL BISULFATE 300 MG PO TABS
ORAL_TABLET | ORAL | Status: AC
  Administered 2012-05-02: 10:00:00 75 mg via ORAL
  Filled 2012-05-01: qty 2

## 2012-05-01 MED ORDER — LIDOCAINE HCL (PF) 1 % IJ SOLN
INTRAMUSCULAR | Status: AC
  Filled 2012-05-01: qty 30

## 2012-05-01 MED ORDER — WARFARIN SODIUM 5 MG PO TABS
5.0000 mg | ORAL_TABLET | Freq: Once | ORAL | Status: AC
  Administered 2012-05-01: 21:00:00 5 mg via ORAL
  Filled 2012-05-01: qty 1

## 2012-05-01 MED ORDER — HEPARIN (PORCINE) IN NACL 100-0.45 UNIT/ML-% IJ SOLN
1300.0000 [IU]/h | INTRAMUSCULAR | Status: DC
  Administered 2012-05-01 – 2012-05-04 (×4): 1200 [IU]/h via INTRAVENOUS
  Administered 2012-05-05: 1400 [IU]/h via INTRAVENOUS
  Administered 2012-05-06 (×2): 1300 [IU]/h via INTRAVENOUS
  Filled 2012-05-01 (×12): qty 250

## 2012-05-01 MED ORDER — SODIUM CHLORIDE 0.9 % IV SOLN
1.0000 mL/kg/h | INTRAVENOUS | Status: AC
  Administered 2012-05-01: 1 mL/kg/h via INTRAVENOUS

## 2012-05-01 NOTE — H&P (View-Only) (Signed)
Subjective:  The patient is not having any chest discomfort this morning.  He remains on IV nitroglycerin and IV heparin.  His INR today is down to 1.39 okay for cath. Creatinine 1.43. Headache from IV NTG.  Will stop IV NTG.  Objective:  Vital Signs in the last 24 hours: Temp:  [97.7 F (36.5 C)-98.3 F (36.8 C)] 98.3 F (36.8 C) (11/26 0500) Pulse Rate:  [52-59] 53  (11/26 0749) Resp:  [14-18] 18  (11/26 0500) BP: (150-180)/(66-91) 176/78 mmHg (11/26 0749) SpO2:  [96 %-98 %] 97 % (11/26 0500)  Intake/Output from previous day: 11/25 0701 - 11/26 0700 In: 360 [P.O.:360] Out: 1150 [Urine:1150] Intake/Output from this shift:       . [EXPIRED] aspirin  324 mg Oral NOW   Or  . [EXPIRED] aspirin  300 mg Rectal NOW  . aspirin EC  81 mg Oral Daily  . carvedilol  6.25 mg Oral BID WC  . insulin aspart  0-15 Units Subcutaneous TID WC  . omega-3 acid ethyl esters  2 g Oral Daily  . [COMPLETED] phytonadione (VITAMIN K) IV  2 mg Intravenous Once  . simvastatin  40 mg Oral q1800  . sodium chloride  3 mL Intravenous Q12H  . [DISCONTINUED] carvedilol  3.125 mg Oral BID WC      . sodium chloride 50 mL/hr (05/01/12 0750)  . heparin 1,200 Units/hr (04/30/12 1619)  . nitroGLYCERIN 10 mcg/min (04/29/12 2115)    Physical Exam: The patient appears to be in no distress.  Head and neck exam reveals that the pupils are equal and reactive.  The extraocular movements are full.  There is no scleral icterus.  Mouth and pharynx are benign.  No lymphadenopathy.  No carotid bruits.  The jugular venous pressure is normal.  Thyroid is not enlarged or tender.  Chest is clear to percussion and auscultation.  No rales or rhonchi.  Expansion of the chest is symmetrical.  Heart reveals no abnormal lift or heave.  First and second heart sounds are normal.  There is no murmur gallop rub or click.  The abdomen is soft and nontender.  Bowel sounds are normoactive.  There is no hepatosplenomegaly or mass.   There are no abdominal bruits.  Extremities reveal no phlebitis or edema.  Pedal pulses are good.  There is no cyanosis or clubbing.  Neurologic exam is normal strength and no lateralizing weakness.  No sensory deficits.  Integument reveals no rash  Lab Results:  Basename 05/01/12 0200 04/30/12 0230  WBC 8.0 7.5  HGB 13.7 14.0  PLT 144* 143*    Basename 05/01/12 0200 04/30/12 0230  NA 136 138  K 4.6 3.9  CL 102 103  CO2 26 27  GLUCOSE 163* 131*  BUN 21 20  CREATININE 1.43* 1.47*    Basename 04/30/12 1225 04/30/12 0218  TROPONINI <0.30 <0.30   Hepatic Function Panel No results found for this basename: PROT,ALBUMIN,AST,ALT,ALKPHOS,BILITOT,BILIDIR,IBILI in the last 72 hours  Basename 04/30/12 0230  CHOL 188   No results found for this basename: PROTIME in the last 72 hours  Imaging: Imaging results have been reviewed.  COPD with scarring.  Prior CABG.  No active disease  Cardiac Studies: Telemetry shows normal sinus rhythm Assessment/Plan:  Patient Active Hospital Problem List: Elevated troponin (04/29/2012)   Assessment: Subsequent troponins are less than 0.30   Coronary artery disease ()   Assessment: Cardiac catheterization    Plan: Cath today.  Anginal pain (04/29/2012)   Assessment: No  further chest pain since admission    Plan: Continue IV heparin. Diabetes mellitus (04/29/2012)   Assessment: Hemoglobin A1c 7.0   Plan: Continue sliding scale insulin   CKD (chronic kidney disease), stage III (04/29/2012)   Assessment: Creatinine 1.43 with gentle hydration    Plan: Okay to proceed with cardiac catheterization NSTEMI (non-ST elevated myocardial infarction) (04/29/2012)   Assessment: EKG stable  Hypertension, uncontrolled (04/29/2012)   Assessment: Blood pressure controlled on current therapy  Plan: Cath today.  LOS: 2 days    Wesley [name] 05/01/2012, 8:02 AM

## 2012-05-01 NOTE — Interval H&P Note (Signed)
History and Physical Interval Note:  05/01/2012 11:06 AM  Wesley [name]  has presented today for surgery, with the diagnosis of nstemi  The various methods of treatment have been discussed with the patient and family. After consideration of risks, benefits and other options for treatment, the patient has consented to  Procedure(s) (LRB) with comments: LEFT HEART CATHETERIZATION WITH CORONARY/GRAFT ANGIOGRAM (N/A) - Possible angioplasty as a surgical intervention .  The patient's history has been reviewed, patient examined, no change in status, stable for surgery.  I have reviewed the patient's chart and labs.  Questions were answered to the patient's satisfaction.     [name]

## 2012-05-01 NOTE — Progress Notes (Signed)
ANTICOAGULATION CONSULT NOTE - Follow Up Consult  Pharmacy Consult for heparin and warfarin Indication: hisotry of pulmonary embolus  Patient Measurements: Height: 6\' 2"  (188 cm) Weight: 229 lb 4.5 oz (104 kg) IBW/kg (Calculated) : 82.2  Heparin Dosing Weight: 103kg  Vital Signs: Temp: 98.3 F (36.8 C) (11/26 0500) Temp src: Oral (11/26 0500) BP: 176/78 mmHg (11/26 0749) Pulse Rate: 53  (11/26 0749)  Labs:  Basename 05/01/12 0200 04/30/12 1508 04/30/12 1225 04/30/12 0230 04/30/12 0218 04/29/12 2151 04/29/12 1449  HGB 13.7 -- -- 14.0 -- -- --  HCT 39.3 -- -- 40.3 -- -- 43.7  PLT 144* -- -- 143* -- -- 166  APTT -- -- -- -- -- -- --  LABPROT 16.7* -- -- 25.2* -- -- 21.4*  INR 1.39 -- -- 2.42* -- -- 1.94*  HEPARINUNFRC 0.50 0.84* -- 0.75* -- -- --  CREATININE 1.43* -- -- 1.47* -- -- 1.50*  CKTOTAL -- -- -- -- -- -- --  CKMB -- -- -- -- -- -- --  TROPONINI -- -- <0.30 -- <0.30 <0.30 --    Estimated Creatinine Clearance: 54.7 ml/min (by C-G formula based on Cr of 1.43).   Medications:  Warfarin 5mg  daily pta  Assessment: 78 year old male with extensive cardiac history presents to [facility] with chest pain. He was taken to cath this morning. PCI to intermediate branch. Patient also with history of pulmonary emboli about 2 years ago and has remained on chronic warfarin therapy. INR was reversed yesterday and was down to 1.39 this am, he was also placed on heparin for bridging.  Orders received post cath to restart heparin and warfarin. There is a possibility of staged PCI SVG>RCA at some point. INR will likely be low next 2-3 days after vit k reversal in case he needed to go back to cath later this week.  Goal of Therapy:  INR 2-3 Heparin level 0.3-0.7 units/ml Monitor platelets by anticoagulation protocol: Yes   Plan:  Warfarin 5mg  tonight -daily INR ordered Restart heparin infusion at 1200 units/hr- follow up with am heparin level Check anti-Xa level in 6 hours and daily  while on heparin Continue to monitor H&H and platelets  Wesley [name] 05/01/2012,2:46 PM

## 2012-05-01 NOTE — Clinical Social Work Psychosocial (Signed)
     Clinical Social Work Department BRIEF PSYCHOSOCIAL ASSESSMENT 05/01/2012  Patient:  JADRIEN, NARINE     Account Number:  [id number]     Admit date:  04/29/2012  Clinical Social Worker:  Margaree Mackintosh  Date/Time:  05/01/2012 09:05 AM  Referred by:  Physician  Date Referred:  04/30/2012 Referred for  Advanced Directives   Other Referral:   Interview type:  Patient Other interview type:    PSYCHOSOCIAL DATA Living Status:  ALONE Admitted from facility:   Level of care:   Primary support name:  Stanton Kidney: [phone] Primary support relationship to patient:  CHILD, ADULT Degree of support available:   Unknown.    CURRENT CONCERNS Current Concerns  Other - See comment   Other Concerns:   Advance Directives.    SOCIAL WORK ASSESSMENT / PLAN Clincial Social WOrke rrecieved referral for [employer].  CSW met with pt at bedside.  CSW introduced self, explained role, and provide support.  CSW reviewed Advance Directive information. CSW provided opportunity for pt to process questions/concerns.  CSW to sign off at this time.  Pt would like to review paperwork at home.   Assessment/plan status:  Information/Referral to [facility] Other assessment/ plan:   Information/referral to community resources:   Proofreader.    PATIENTS/FAMILYS RESPONSE TO PLAN OF CARE: Pt was pleasant and engaged.  Pt thanked CSW for intervention.

## 2012-05-01 NOTE — Progress Notes (Signed)
 Subjective:  The patient is not having any chest discomfort this morning.  He remains on IV nitroglycerin and IV heparin.  His INR today is down to 1.39 okay for cath. Creatinine 1.43. Headache from IV NTG.  Will stop IV NTG.  Objective:  Vital Signs in the last 24 hours: Temp:  [97.7 F (36.5 C)-98.3 F (36.8 C)] 98.3 F (36.8 C) (11/26 0500) Pulse Rate:  [52-59] 53  (11/26 0749) Resp:  [14-18] 18  (11/26 0500) BP: (150-180)/(66-91) 176/78 mmHg (11/26 0749) SpO2:  [96 %-98 %] 97 % (11/26 0500)  Intake/Output from previous day: 11/25 0701 - 11/26 0700 In: 360 [P.O.:360] Out: 1150 [Urine:1150] Intake/Output from this shift:       . [EXPIRED] aspirin  324 mg Oral NOW   Or  . [EXPIRED] aspirin  300 mg Rectal NOW  . aspirin EC  81 mg Oral Daily  . carvedilol  6.25 mg Oral BID WC  . insulin aspart  0-15 Units Subcutaneous TID WC  . omega-3 acid ethyl esters  2 g Oral Daily  . [COMPLETED] phytonadione (VITAMIN K) IV  2 mg Intravenous Once  . simvastatin  40 mg Oral q1800  . sodium chloride  3 mL Intravenous Q12H  . [DISCONTINUED] carvedilol  3.125 mg Oral BID WC      . sodium chloride 50 mL/hr (05/01/12 0750)  . heparin 1,200 Units/hr (04/30/12 1619)  . nitroGLYCERIN 10 mcg/min (04/29/12 2115)    Physical Exam: The patient appears to be in no distress.  Head and neck exam reveals that the pupils are equal and reactive.  The extraocular movements are full.  There is no scleral icterus.  Mouth and pharynx are benign.  No lymphadenopathy.  No carotid bruits.  The jugular venous pressure is normal.  Thyroid is not enlarged or tender.  Chest is clear to percussion and auscultation.  No rales or rhonchi.  Expansion of the chest is symmetrical.  Heart reveals no abnormal lift or heave.  First and second heart sounds are normal.  There is no murmur gallop rub or click.  The abdomen is soft and nontender.  Bowel sounds are normoactive.  There is no hepatosplenomegaly or mass.   There are no abdominal bruits.  Extremities reveal no phlebitis or edema.  Pedal pulses are good.  There is no cyanosis or clubbing.  Neurologic exam is normal strength and no lateralizing weakness.  No sensory deficits.  Integument reveals no rash  Lab Results:  Basename 05/01/12 0200 04/30/12 0230  WBC 8.0 7.5  HGB 13.7 14.0  PLT 144* 143*    Basename 05/01/12 0200 04/30/12 0230  NA 136 138  K 4.6 3.9  CL 102 103  CO2 26 27  GLUCOSE 163* 131*  BUN 21 20  CREATININE 1.43* 1.47*    Basename 04/30/12 1225 04/30/12 0218  TROPONINI <0.30 <0.30   Hepatic Function Panel No results found for this basename: PROT,ALBUMIN,AST,ALT,ALKPHOS,BILITOT,BILIDIR,IBILI in the last 72 hours  Basename 04/30/12 0230  CHOL 188   No results found for this basename: PROTIME in the last 72 hours  Imaging: Imaging results have been reviewed.  COPD with scarring.  Prior CABG.  No active disease  Cardiac Studies: Telemetry shows normal sinus rhythm Assessment/Plan:  Patient Active Hospital Problem List: Elevated troponin (04/29/2012)   Assessment: Subsequent troponins are less than 0.30   Coronary artery disease ()   Assessment: Cardiac catheterization    Plan: Cath today.  Anginal pain (04/29/2012)   Assessment: No   further chest pain since admission    Plan: Continue IV heparin. Diabetes mellitus (04/29/2012)   Assessment: Hemoglobin A1c 7.0   Plan: Continue sliding scale insulin   CKD (chronic kidney disease), stage III (04/29/2012)   Assessment: Creatinine 1.43 with gentle hydration    Plan: Okay to proceed with cardiac catheterization NSTEMI (non-ST elevated myocardial infarction) (04/29/2012)   Assessment: EKG stable  Hypertension, uncontrolled (04/29/2012)   Assessment: Blood pressure controlled on current therapy  Plan: Cath today.  LOS: 2 days    Shavon Zenz 05/01/2012, 8:02 AM    

## 2012-05-01 NOTE — Progress Notes (Signed)
ANTICOAGULATION CONSULT NOTE - Follow Up Consult  Pharmacy Consult for heparin Indication: ACS and h/o PE  Labs:  Basename 05/01/12 0200 04/30/12 1508 04/30/12 1225 04/30/12 0230 04/30/12 0218 04/29/12 2151 04/29/12 1449  HGB 13.7 -- -- 14.0 -- -- --  HCT 39.3 -- -- 40.3 -- -- 43.7  PLT 144* -- -- 143* -- -- 166  APTT -- -- -- -- -- -- --  LABPROT 16.7* -- -- 25.2* -- -- 21.4*  INR 1.39 -- -- 2.42* -- -- 1.94*  HEPARINUNFRC 0.50 0.84* -- 0.75* -- -- --  CREATININE -- -- -- 1.47* -- -- 1.50*  CKTOTAL -- -- -- -- -- -- --  CKMB -- -- -- -- -- -- --  TROPONINI -- -- <0.30 -- <0.30 <0.30 --    Assessment/Plan:  78yo male now therapeutic on heparin after multiple rate reductions; INR now reversed.  Will continue gtt at current rate and confirm stable with additional level.  Colleen Can PharmD BCPS 05/01/2012,3:36 AM

## 2012-05-01 NOTE — CV Procedure (Signed)
Cardiac Catheterization Procedure Note  Name: Carina Alire MRN: [id number] DOB: [birth year]  Procedure: Left Heart Cath, Selective Coronary Angiography, saphenous vein graft angiography, LIMA angiography, PTCA of the distal intermediate branch beyond the graft insertion site.  Indication: 78 year old gentleman with known CAD status post CABG at [facility] in 2000. He is on long-term warfarin because of pulmonary emboli. He presented with typical symptoms of unstable angina with anginal symptoms and very low-level activity. He is referred for cardiac catheterization.  Procedural Details:  The left wrist was prepped, draped, and anesthetized with 1% lidocaine. Using the modified Seldinger technique, a 5 French sheath was introduced into the left radial artery. 3 mg of verapamil was administered through the sheath, weight-based unfractionated heparin was administered intravenously. Standard Judkins catheters were used for selective coronary angiography. A JR 4 catheter was used to image the saphenous vein graft to intermediate, a multipurpose catheter was used to record left ventricular pressures and image the saphenous vein graft to right PDA, the JR 4 was used to image the saphenous vein graft to obtuse marginal, and an IMA catheter was used to image the LIMA. Catheter exchanges were performed over an exchange length guidewire.  PROCEDURAL FINDINGS Hemodynamics: AO 149/60 with a mean of 91 LV 149/17   Coronary angiography: Coronary dominance: right  Left mainstem: The left main is moderately diseased with diffuse 50% stenosis.  Left anterior descending (LAD): The LAD is totally occluded after the first septal perforator. The first diagonal branch is severely diseased in a diffuse fashion with proximal and mid vessel 80-90% stenosis.  Left circumflex (LCx): The left circumflex is totally occluded.  Right coronary artery (RCA): The RCA is totally occluded in the proximal  portion.  Saphenous vein graft to distal RCA: Patent throughout with a smooth 60% stenosis in the mid body of the graft. The distal anastomotic site is patent and the PDA and posterolateral branches are diffusely diseased but both are patent.  Saphenous vein graft to obtuse marginal: Patent graft with no significant stenoses. The OM just beyond the distal anastomotic site as 75-80% stenosis into a segmental he diseased branch vessel. There is also severe disease proximal to the anastomotic site in the native OM and the second obtuse marginal branch fills retrograde from the bypass graft.  Saphenous vein graft to intermediate branch is patent throughout the distal portion of the intermediate branch has 99% diffuse stenosis leading into multiple subbranches.  LIMA to LAD: Widely patent with no significant stenosis. The native LAD is severely diseased and is a very small vessel with poor outflow. There is 90-95% diffuse stenosis beyond the insertion site of the graft.  Left ventriculography: Deferred because of chronic kidney disease  PCI Note:  Following the diagnostic procedure, the decision was made to proceed with PCI. The patient has multiple areas with severe disease in a distal small vessel distribution. I felt the most critical lesion was in the native intermediate branch beyond the graft insertion site. Other potential areas of ischemia including in LAD territory, the obtuse marginal territory with disease beyond the graft insertion site.  The vein graft to the RCA territory had moderate disease but did not feel like this was an ischemia causing lesion.  Weight-based bivalirudin was given for anticoagulation.  A whisper coronary guidewire was used to cross the lesion.  The lesion was predilated with a 1.2 mm balloon. I then passed a 1.5 mm balloon across the lesion and dilated it to 10 atmospheres. After that balloon  inflation, there was a linear dissection at the PTCA site. I reinflated the  balloon to 4 atmospheres and then a prolonged 2 minute inflation. The balloon was removed and there was TIMI-3 flow in the vessel. There was a residual dissection plane clearly visible but it was non-flow limiting. The patient had no anginal symptoms and I felt the vessel was clearly too small to stent. There was residual 70% stenosis at the lesion site. A TR band was used for radial hemostasis. The patient was transferred to the post catheterization recovery area for further monitoring.  PCI Data: Vessel - ramus intermedius/Segment - distal Percent Stenosis (pre)  99 TIMI-flow 3 Stent none Percent Stenosis (post) 70 TIMI-flow (post) 3 Complications: Nonflow limiting balloon dissection  Final Conclusions:   1. Severe native three-vessel coronary artery disease 2. Status post CABG with continued patency of the SVG to RCA, SVG to OM, SVG to intermediate branch, and LIMA to LAD. 3. Severe diffuse small vessel disease as described above 4. Moderate stenosis of the SVG to RCA 5. PCI of the native intermediate branch with significant residual stenosis and nonflow limiting linear dissection.  Recommendations:  The patient has severe coronary disease. His bypass grafts remain patent. I will review his films with colleagues, but I would favor treating the saphenous vein graft to RCA because of moderate stenosis in the midportion of the graft. I worry about long-term graft patency if this area is not treated. This could be done in staged fashion considering his chronic kidney disease. I would recommend treating him with Plavix and Coumadin since he requires long-term anticoagulation because of pulmonary emboli.  [name] 05/01/2012, 12:56 PM

## 2012-05-02 DIAGNOSIS — I200 Unstable angina: Secondary | ICD-10-CM

## 2012-05-02 LAB — GLUCOSE, CAPILLARY
Glucose-Capillary: 150 mg/dL — ABNORMAL HIGH (ref 70–99)
Glucose-Capillary: 220 mg/dL — ABNORMAL HIGH (ref 70–99)

## 2012-05-02 LAB — PROTIME-INR: INR: 1.2 (ref 0.00–1.49)

## 2012-05-02 LAB — HEPARIN LEVEL (UNFRACTIONATED): Heparin Unfractionated: 0.3 IU/mL (ref 0.30–0.70)

## 2012-05-02 LAB — BASIC METABOLIC PANEL
BUN: 17 mg/dL (ref 6–23)
CO2: 25 mEq/L (ref 19–32)
Chloride: 104 mEq/L (ref 96–112)
GFR calc non Af Amer: 44 mL/min — ABNORMAL LOW (ref >90)
Glucose, Bld: 182 mg/dL — ABNORMAL HIGH (ref 70–99)
Potassium: 3.9 mEq/L (ref 3.5–5.1)
Sodium: 137 mEq/L (ref 135–145)

## 2012-05-02 LAB — CBC
HCT: 38.9 % — ABNORMAL LOW (ref 39.0–52.0)
Hemoglobin: 13.5 g/dL (ref 13.0–17.0)
RBC: 4.43 MIL/uL (ref 4.22–5.81)
WBC: 7.8 10*3/uL (ref 4.0–10.5)

## 2012-05-02 MED ORDER — WARFARIN SODIUM 7.5 MG PO TABS
7.5000 mg | ORAL_TABLET | Freq: Once | ORAL | Status: AC
  Administered 2012-05-02: 7.5 mg via ORAL
  Filled 2012-05-02 (×2): qty 1

## 2012-05-02 MED FILL — Dextrose Inj 5%: INTRAVENOUS | Qty: 50 | Status: AC

## 2012-05-02 NOTE — Progress Notes (Addendum)
    Subjective:  No chest pain or dyspnea  Objective:  Vital Signs in the last 24 hours: Temp:  [97.3 F (36.3 C)-98.4 F (36.9 C)] 98.4 F (36.9 C) (11/27 1945) Pulse Rate:  [52-82] 56  (11/27 1945) Resp:  [15-19] 18  (11/27 1945) BP: (112-172)/(53-87) 122/65 mmHg (11/27 1945) SpO2:  [95 %-98 %] 95 % (11/27 1945) Weight:  [101.2 kg (223 lb 1.7 oz)] 101.2 kg (223 lb 1.7 oz) (11/27 0013)  Intake/Output from previous day: 11/26 0701 - 11/27 0700 In: 1005.6 [I.V.:1005.6] Out: 1075 [Urine:1075]  Physical Exam: Pt is alert and oriented, elderly male in NAD HEENT: normal Neck: JVP - normal Lungs: CTA bilaterally CV: irregularly irregular without murmur or gallop Abd: soft, NT, Positive BS, no hepatomegaly Ext: no C/C/E, left radial site ok Skin: warm/dry no rash   Lab Results:  Basename 05/02/12 0455 05/01/12 0200  WBC 7.8 8.0  HGB 13.5 13.7  PLT 131* 144*    Basename 05/02/12 0455 05/01/12 0200  NA 137 136  K 3.9 4.6  CL 104 102  CO2 25 26  GLUCOSE 182* 163*  BUN 17 21  CREATININE 1.47* 1.43*    Basename 04/30/12 1225 04/30/12 0218  TROPONINI <0.30 <0.30    Cardiac Studies: 2D Echo: Left ventricle: The cavity size was normal. Wall thickness was normal. Systolic function was mildly to moderately reduced. The estimated ejection fraction was in the range of 40% to 45%.  ------------------------------------------------------------ Aortic valve: Structurally normal valve. Cusp separation was normal. Doppler: Transvalvular velocity was within the normal range. There was no stenosis. No regurgitation.  ------------------------------------------------------------ Aorta: Ascending aorta: The ascending aorta was mildly dilated.  ------------------------------------------------------------ Mitral valve: Structurally normal valve. Leaflet separation was normal. Doppler: Transvalvular velocity was within the normal range. There was no evidence for stenosis. No  regurgitation. Peak gradient: 3mm Hg (D).  ------------------------------------------------------------ Left atrium: The atrium was normal in size.  ------------------------------------------------------------ Right ventricle: The cavity size was normal. Wall thickness was normal. Systolic function was normal.  ------------------------------------------------------------ Pulmonic valve: Doppler: No significant regurgitation.  ------------------------------------------------------------ Tricuspid valve: Doppler: No significant regurgitation.  ------------------------------------------------------------ Right atrium: The atrium was normal in size.  ------------------------------------------------------------ Pericardium: There was no pericardial effusion.  Tele: Sinus rhythm, personally reviewed.  Assessment/Plan:  1. Unstable angina. S/p PCI yesterday. Pt with diffuse severe small vessel disease. I reviewed his cath films with Dr [place] today. We agreed the best strategy is ongoing medical therapy of his residual CAD. Will continue heparin/coumadin/plavix without aspirin. Considering he wasn't treated with a stent, I would probably limit his plavix to 30 days considering gait instability and need for long-term warfarin.  2. 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 Gait instability - PT consult  4. Hypertension - labile with episodes of orthostasis  Dispo: continue PT, continue heparin bridge while patient in hospital, ok for home when INR approaching 2.0  [name], M.D. 05/02/2012, 10:39 PM

## 2012-05-02 NOTE — Progress Notes (Signed)
  Echocardiogram 2D Echocardiogram has been performed.  Devynn Hessler 05/02/2012, 8:40 AM

## 2012-05-02 NOTE — Progress Notes (Signed)
CARDIAC REHAB PHASE I   PRE:  Rate/Rhythm: 54SB  BP:  Supine: 187/71  Sitting: 161/70  Standing: 106/77 dizzy   SaO2:   MODE:  Ambulation: 0 ft   POST:  Rate/Rhythem: 55  BP:  Supine: 217/80 dinamapp, 200/100 manuel   Sitting:   Standing:    SaO2:  0900-0950 We came to evaluate walking. Pt states uses cane at home and holds to furniture. Stated he had rollator also. Difficulty getting to standing position due to knees. Took BP standing as documented. Pt stated he needed to sit down. Had wanted to get on [facility] but pt stated he felt as if he would fall off of it. Put pt back in bed and took presssure. Elevated as documented. Pt said I feel better now and need to get on [facility]. Pt did not understand that he would get lightheaded again if we sat him back up. Put bedpan under pt and notified pt's RN. Left MI booklet and diabetic diet for family. Pt needs eval from PT for home safety as pt on Coumadin.  Duanne Limerick

## 2012-05-02 NOTE — Progress Notes (Signed)
Physical Therapy Evaluation Patient Details Name: Brook Mall MRN: [id number] DOB: [birth year] Today's Date: 05/02/2012 Time: 4098-1191 PT Time Calculation (min): 24 min  PT Assessment / Plan / Recommendation Clinical Impression  78 yo male admitted with anginal chest pain, s/p Cardiac Cath 11/26, now with decr activity tol, limited by orthostatic hypotension; Will benefit for PT to maximize independence and safety with mobility/amb/transfers and to enable safe dc home    PT Assessment  Patient needs continued PT services    Follow Up Recommendations  Home health PT;Supervision/Assistance - 24 hour    Does the patient have the potential to tolerate intense rehabilitation      Barriers to Discharge        Equipment Recommendations  Rolling walker with 5" wheels;3 in 1 bedside comode    Recommendations for Other Services     Frequency Min 3X/week    Precautions / Restrictions Precautions Precautions: Fall Precaution Comments: Orthostatic hypotension on eval   Pertinent Vitals/Pain No pain Please see Doc Flowsheets for Orthostatic vitals      Mobility  Bed Mobility Bed Mobility: Supine to Sit;Sitting - Scoot to Edge of Bed Supine to Sit: 5: Supervision;With rails Sitting - Scoot to Edge of Bed: 5: Supervision Details for Bed Mobility Assistance: Inefficient movement, but not requiring physical assist Transfers Transfers: Sit to Stand;Stand to Sit Sit to Stand: 3: Mod assist;From bed;With upper extremity assist Stand to Sit: 4: Min assist;To bed;With upper extremity assist Details for Transfer Assistance: Light moderate anti-gravity assist with sit to stand, and with dependence on UE push, indicating some generalized weakness Ambulation/Gait Ambulation/Gait Assistance: 4: Min guard Ambulation Distance (Feet): 0 Feet (March in place in front of bed with UE support on RW) Assistive device: Rolling walker Ambulation/Gait Assistance Details: Cues to self-monitor for  activity tol, noted dependent on UE suport for steadiness; march in place, and upright activity mostly limited by orthostatic hypotension    Shoulder Instructions     Exercises     PT Diagnosis: Generalized weakness;Difficulty walking  PT Problem List: Decreased strength;Decreased activity tolerance;Decreased balance;Decreased mobility;Decreased knowledge of use of DME;Cardiopulmonary status limiting activity PT Treatment Interventions: DME instruction;Gait training;Stair training;Functional mobility training;Therapeutic activities;Therapeutic exercise;Patient/family education   PT Goals Acute Rehab PT Goals PT Goal Formulation: With patient Time For Goal Achievement: 05/09/12 Potential to Achieve Goals: Good Pt will go Supine/Side to Sit: with supervision PT Goal: Supine/Side to Sit - Progress: Goal set today Pt will go Sit to Supine/Side: with supervision PT Goal: Sit to Supine/Side - Progress: Goal set today Pt will go Sit to Stand: with supervision PT Goal: Sit to Stand - Progress: Goal set today Pt will go Stand to Sit: with supervision PT Goal: Stand to Sit - Progress: Goal set today Pt will Ambulate: >150 feet;with supervision;with least restrictive assistive device PT Goal: Ambulate - Progress: Goal set today Pt will Go Up / Down Stairs: 1-2 stairs;with supervision;with least restrictive assistive device PT Goal: Up/Down Stairs - Progress: Goal set today  Visit Information  Last PT Received On: 05/02/12 Assistance Needed: +2 (hopefully +1 soon as orthstasis clears)    Subjective Data  Subjective: Worried about feeling dizzy when up Patient Stated Goal: Home   Prior Functioning  Home Living Lives With: Alone Available Help at Discharge: Family;Available 24 hours/day (Daughter is planning on taking FMLA) Type of Home: House Home Access: Stairs to enter [employer] of Steps: 1 Entrance Stairs-Rails: None Home Layout: One level Bathroom Shower/Tub: Engineer, production Equipment:  None Prior Function Level of Independence: Independent Able to Take Stairs?: Yes Driving: Yes Communication Communication: No difficulties    Cognition  Overall Cognitive Status: Appears within functional limits for tasks assessed/performed Arousal/Alertness: Awake/alert Orientation Level: Appears intact for tasks assessed Behavior During Session: [facility] for tasks performed    Extremity/Trunk Assessment Right Upper Extremity Assessment RUE ROM/Strength/Tone: [facility] for tasks assessed Left Upper Extremity Assessment LUE ROM/Strength/Tone: WFL for tasks assessed Right Lower Extremity Assessment RLE ROM/Strength/Tone: Deficits RLE ROM/Strength/Tone Deficits: Some mild generalized weakness Left Lower Extremity Assessment LLE ROM/Strength/Tone: Deficits LLE ROM/Strength/Tone Deficits: Some mild generalized weakness Trunk Assessment Trunk Assessment: Normal   Balance    End of Session PT - End of Session Equipment Utilized During Treatment: Other (comment) (BP monitor) Activity Tolerance: Other (comment) (limited by orthostatic hypotension) Patient left: in bed;with call bell/phone within reach;with family/visitor present (in prep for transfer to 2030) Nurse Communication: Mobility status  GP     Van Clines [facility] [place], [place] [phone]  05/02/2012, 1:08 PM

## 2012-05-02 NOTE — Progress Notes (Signed)
ANTICOAGULATION CONSULT NOTE - Follow Up Consult  Pharmacy Consult for heparin Indication: h/o PE  Labs:  Basename 05/02/12 0455 05/01/12 0200 04/30/12 1508 04/30/12 1225 04/30/12 0230 04/30/12 0218 04/29/12 2151  HGB 13.5 13.7 -- -- -- -- --  HCT 38.9* 39.3 -- -- 40.3 -- --  PLT 131* 144* -- -- 143* -- --  APTT -- -- -- -- -- -- --  LABPROT 15.0 16.7* -- -- 25.2* -- --  INR 1.20 1.39 -- -- 2.42* -- --  HEPARINUNFRC 0.30 0.50 0.84* -- -- -- --  CREATININE 1.47* 1.43* -- -- 1.47* -- --  CKTOTAL -- -- -- -- -- -- --  CKMB -- -- -- -- -- -- --  TROPONINI -- -- -- <0.30 -- <0.30 <0.30    Assessment/Plan:  78yo male therapeutic on heparin after resumed post-cath awaiting possible PCI, at very low end of goal but has been therapeutic (0.5) at this rate with supratherapeutic levels at higher rates so will continue gtt at current rate for now and confirm stable with additional level.  Colleen Can PharmD BCPS 05/02/2012,5:54 AM

## 2012-05-02 NOTE — Progress Notes (Signed)
ANTICOAGULATION CONSULT NOTE - Follow Up Consult  Pharmacy Consult for heparin and warfarin Indication: hisotry of pulmonary embolus  Patient Measurements: Height: 6\' 2"  (188 cm) Weight: 223 lb 1.7 oz (101.2 kg) IBW/kg (Calculated) : 82.2  Heparin Dosing Weight: 103kg  Vital Signs: Temp: 97.3 F (36.3 C) (11/27 0814) Temp src: Oral (11/27 0814) BP: 170/64 mmHg (11/27 0814) Pulse Rate: 55  (11/27 0814)  Labs:  Basename 05/02/12 0915 05/02/12 0455 05/01/12 0200 04/30/12 1225 04/30/12 0230 04/30/12 0218 04/29/12 2151  HGB -- 13.5 13.7 -- -- -- --  HCT -- 38.9* 39.3 -- 40.3 -- --  PLT -- 131* 144* -- 143* -- --  APTT -- -- -- -- -- -- --  LABPROT -- 15.0 16.7* -- 25.2* -- --  INR -- 1.20 1.39 -- 2.42* -- --  HEPARINUNFRC 0.40 0.30 0.50 -- -- -- --  CREATININE -- 1.47* 1.43* -- 1.47* -- --  CKTOTAL -- -- -- -- -- -- --  CKMB -- -- -- -- -- -- --  TROPONINI -- -- -- <0.30 -- <0.30 <0.30    Estimated Creatinine Clearance: 52.6 ml/min (by C-G formula based on Cr of 1.47).   Medications:  Warfarin 5mg  daily pta  Assessment: 78 year old male with extensive cardiac history presents to [facility] with chest pain. He was taken to cath yesterday. PCI to intermediate branch. Patient also with history of pulmonary emboli about 2 years ago and has remained on chronic warfarin therapy. INR was reversed 11/25 and now down to 1.2 this morning, he continues on heparin for bridging. Heparin is within goal range without complications.  Will continue to follow cardiology plan as it may include a pci and patient being off oral anticoagulation. For now will continue heparin and coumadin as ordered.  Goal of Therapy:  INR 2-3 Heparin level 0.3-0.7 units/ml Monitor platelets by anticoagulation protocol: Yes   Plan:  Warfarin 7.5mg  tonight -daily INR ordered Continue heparin infusion at 1200 units/hr- follow up with am heparin level Continue to monitor H&H and platelets Follow cards plan  [name] 05/02/2012,11:06 AM

## 2012-05-03 DIAGNOSIS — Z86711 Personal history of pulmonary embolism: Secondary | ICD-10-CM

## 2012-05-03 LAB — CBC
Hemoglobin: 13.9 g/dL (ref 13.0–17.0)
MCV: 89.1 fL (ref 78.0–100.0)
Platelets: 134 10*3/uL — ABNORMAL LOW (ref 150–400)
RBC: 4.4 MIL/uL (ref 4.22–5.81)
WBC: 6.5 10*3/uL (ref 4.0–10.5)

## 2012-05-03 LAB — GLUCOSE, CAPILLARY

## 2012-05-03 LAB — HEPARIN LEVEL (UNFRACTIONATED): Heparin Unfractionated: 0.31 [IU]/mL (ref 0.30–0.70)

## 2012-05-03 MED ORDER — ISOSORBIDE MONONITRATE ER 60 MG PO TB24
60.0000 mg | ORAL_TABLET | Freq: Every day | ORAL | Status: DC
  Administered 2012-05-03 – 2012-05-07 (×5): 60 mg via ORAL
  Filled 2012-05-03 (×5): qty 1

## 2012-05-03 MED ORDER — WARFARIN SODIUM 5 MG PO TABS
5.0000 mg | ORAL_TABLET | Freq: Once | ORAL | Status: AC
  Administered 2012-05-03: 5 mg via ORAL
  Filled 2012-05-03: qty 1

## 2012-05-03 NOTE — Progress Notes (Signed)
ANTICOAGULATION CONSULT NOTE - Follow Up Consult  Pharmacy Consult for heparin and coumadin Indication: hx of PE/DVT and hx of CVA  Allergies  Allergen Reactions  . Doxycycline     swelling  . Niacin And Related Other (See Comments)    Hot flashes    Patient Measurements: Height: 6\' 2"  (188 cm) Weight: 223 lb 1.7 oz (101.2 kg) IBW/kg (Calculated) : 82.2  Heparin Dosing Weight: 103 kg  Vital Signs: Temp: 97.8 F (36.6 C) (11/28 0352) Temp src: Oral (11/28 0352) BP: 151/60 mmHg (11/28 0352) Pulse Rate: 50  (11/28 0352)  Labs:  Basename 05/03/12 0530 05/02/12 0915 05/02/12 0455 05/01/12 0200 04/30/12 1225  HGB 13.9 -- 13.5 -- --  HCT 39.2 -- 38.9* 39.3 --  PLT 134* -- 131* 144* --  APTT -- -- -- -- --  LABPROT 15.6* -- 15.0 16.7* --  INR 1.27 -- 1.20 1.39 --  HEPARINUNFRC 0.31 0.40 0.30 -- --  CREATININE -- -- 1.47* 1.43* --  CKTOTAL -- -- -- -- --  CKMB -- -- -- -- --  TROPONINI -- -- -- -- <0.30    Estimated Creatinine Clearance: 52.6 ml/min (by C-G formula based on Cr of 1.47).   Medications:  Scheduled:    . carvedilol  6.25 mg Oral BID WC  . clopidogrel  75 mg Oral Q breakfast  . insulin aspart  0-15 Units Subcutaneous TID WC  . isosorbide mononitrate  60 mg Oral Daily  . omega-3 acid ethyl esters  2 g Oral Daily  . simvastatin  40 mg Oral q1800  . warfarin  5 mg Oral ONCE-1800  . [COMPLETED] warfarin  7.5 mg Oral ONCE-1800  . Warfarin - Pharmacist Dosing Inpatient   Does not apply q1800   Infusions:    . heparin 1,200 Units/hr (05/02/12 1740)    Assessment: 78 yo male with hx of PE/DVT and CVA is currently on therapeutic heparin and subtherapeutic coumadin.  Patient was on coumadin 5mg  po qday prior to admission.  Heparin level was 0.31 and INR was 1.27 Goal of Therapy:  Heparin level 0.3-0.7 units/ml Monitor platelets by anticoagulation protocol: Yes INR 2-3   Plan:  1) Continue heparin at 1200/hr 2) Warfarin 5mg  x1 tonight 3) Daily heparin  level, CBC, and INR   Seleen Walter, Tsz-Yin 05/03/2012,9:11 AM

## 2012-05-03 NOTE — Progress Notes (Signed)
Pt ambulated in the hallway with RW on RA. Pt ambulated approximately 150 feet without any issues.  Will continue to monitor.  Pt resting in bed within call bell within reach and family at bedside. Wesley [name]

## 2012-05-03 NOTE — Progress Notes (Signed)
    Subjective:  No chest pain or dyspnea, anxious to go home.  Objective:  Vital Signs in the last 24 hours: Temp:  [97.3 F (36.3 C)-98.4 F (36.9 C)] 97.8 F (36.6 C) (11/28 0352) Pulse Rate:  [50-82] 50  (11/28 0352) Resp:  [15-19] 18  (11/28 0352) BP: (112-172)/(58-87) 151/60 mmHg (11/28 0352) SpO2:  [95 %-97 %] 97 % (11/28 0352)  Intake/Output from previous day: 11/27 0701 - 11/28 0700 In: 260 [P.O.:120; I.V.:140] Out: 750 [Urine:750]  Physical Exam: Pt is alert and oriented, elderly male in NAD HEENT: normal Neck: JVP - normal Lungs: CTA bilaterally CV: irregularly irregular without murmur or gallop Abd: soft, NT, Positive BS, no hepatomegaly Ext: no C/C/E, left radial site ok with some bruising Skin: warm/dry no rash   Lab Results:  Basename 05/03/12 0530 05/02/12 0455  WBC 6.5 7.8  HGB 13.9 13.5  PLT 134* 131*    Basename 05/02/12 0455 05/01/12 0200  NA 137 136  K 3.9 4.6  CL 104 102  CO2 25 26  GLUCOSE 182* 163*  BUN 17 21  CREATININE 1.47* 1.43*    Basename 04/30/12 1225  TROPONINI <0.30    Cardiac Studies: 2D Echo: Left ventricle: The cavity size was normal. Wall thickness was normal. Systolic function was mildly to moderately reduced. The estimated ejection fraction was in the range of 40% to 45%.  ------------------------------------------------------------ Aortic valve: Structurally normal valve. Cusp separation was normal. Doppler: Transvalvular velocity was within the normal range. There was no stenosis. No regurgitation.  ------------------------------------------------------------ Aorta: Ascending aorta: The ascending aorta was mildly dilated.  ------------------------------------------------------------ Mitral valve: Structurally normal valve. Leaflet separation was normal. Doppler: Transvalvular velocity was within the normal range. There was no evidence for stenosis. No regurgitation. Peak gradient: 3mm Hg  (D).  ------------------------------------------------------------ Left atrium: The atrium was normal in size.  ------------------------------------------------------------ Right ventricle: The cavity size was normal. Wall thickness was normal. Systolic function was normal.  ------------------------------------------------------------ Pulmonic valve: Doppler: No significant regurgitation.  ------------------------------------------------------------ Tricuspid valve: Doppler: No significant regurgitation.  ------------------------------------------------------------ Right atrium: The atrium was normal in size.  ------------------------------------------------------------ Pericardium: There was no pericardial effusion.  Tele: Sinus rhythm, personally reviewed.  Assessment/Plan:  1. Unstable angina. S/p PCI yesterday of ramus branch thru SVG. Pt with diffuse severe small vessel disease.  The best strategy is ongoing medical therapy of his residual CAD. Will continue heparin/coumadin/plavix without aspirin. Considering he wasn't treated with a stent, I would probably limit his plavix to 30 days considering gait instability and need for long-term warfarin. Will add long term nitrate. On low dose carvedilol but titration limited by bradycardia.   2. Hx PE - on chronic coumadin, INR 1.27  3. Gait instability - PT consult  4. Hypertension - labile with episodes of orthostasis  Dispo: continue PT, continue heparin bridge while patient in hospital, ok for home when INR approaching 2.0  Ewelina Naves [place], M.D.,FACC 05/03/2012, 8:01 AM

## 2012-05-04 DIAGNOSIS — R7989 Other specified abnormal findings of blood chemistry: Secondary | ICD-10-CM

## 2012-05-04 LAB — GLUCOSE, CAPILLARY
Glucose-Capillary: 181 mg/dL — ABNORMAL HIGH (ref 70–99)
Glucose-Capillary: 207 mg/dL — ABNORMAL HIGH (ref 70–99)
Glucose-Capillary: 196 mg/dL — ABNORMAL HIGH (ref 70–99)

## 2012-05-04 LAB — CBC
HCT: 35.8 % — ABNORMAL LOW (ref 39.0–52.0)
Hemoglobin: 12.2 g/dL — ABNORMAL LOW (ref 13.0–17.0)
MCHC: 34.1 g/dL (ref 30.0–36.0)
WBC: 6.3 10*3/uL (ref 4.0–10.5)

## 2012-05-04 LAB — HEPARIN LEVEL (UNFRACTIONATED): Heparin Unfractionated: 0.48 IU/mL (ref 0.30–0.70)

## 2012-05-04 LAB — PROTIME-INR: INR: 1.49 (ref 0.00–1.49)

## 2012-05-04 MED ORDER — WARFARIN SODIUM 5 MG PO TABS
5.0000 mg | ORAL_TABLET | Freq: Once | ORAL | Status: AC
  Administered 2012-05-04: 5 mg via ORAL
  Filled 2012-05-04: qty 1

## 2012-05-04 NOTE — Evaluation (Signed)
Occupational Therapy Evaluation Patient Details Name: Wesley [name] MRN: [id number] DOB: [birth year] Today's Date: 05/04/2012 Time: 2956-2130 OT Time Calculation (min): 30 min  OT Assessment / Plan / Recommendation Clinical Impression  This 18 male admitted admitted with anginal chest pain, s/p Cardiac Cath 11/26.  Pt. requires min guard assist with BADLs due to mildly impaired balance.  Dtr unable to provide 24 hour assist at discharge    OT Assessment  Patient needs continued OT Services    Follow Up Recommendations  SNF;Supervision/Assistance - 24 hour    Barriers to Discharge Decreased caregiver support    Equipment Recommendations  Rolling walker with 5" wheels;3 in 1 bedside comode    Recommendations for Other Services    Frequency  Min 2X/week    Precautions / Restrictions Precautions Precautions: Fall Restrictions Weight Bearing Restrictions: No       ADL  Eating/Feeding: Independent Where Assessed - Eating/Feeding: Bed level Grooming: Wash/dry hands;Wash/dry face;Brushing hair;Min guard Where Assessed - Grooming: Supported standing Upper Body Bathing: Set up Where Assessed - Upper Body Bathing: Unsupported sitting Lower Body Bathing: Min guard Where Assessed - Lower Body Bathing: Supported sit to stand Upper Body Dressing: Set up Where Assessed - Upper Body Dressing: Unsupported sitting Lower Body Dressing: Min guard Where Assessed - Lower Body Dressing: Supported sit to Pharmacist, hospital: Hydrographic surveyor Method: Sit to Barista: Regular height toilet Toileting - Architect and Hygiene: Min guard Where Assessed - Engineer, mining and Hygiene: Standing Equipment Used: Rolling walker Transfers/Ambulation Related to ADLs: Pt ambulates with min guard assist ADL Comments: Pt. fatigues and c/o of dizziness with ambulation in hallway.  Long discussion with pt and dtr re: safety issues with discharge to  home.  Optimally pt. would benefit from 24 hour supervision at discharge.  Dtr very concerned about possible syncopal episodes and pt adjusting to new medication.     OT Diagnosis: Generalized weakness  OT Problem List: Decreased strength;Decreased activity tolerance;Impaired balance (sitting and/or standing);Decreased knowledge of use of DME or AE OT Treatment Interventions: Self-care/ADL training;DME and/or AE instruction;Therapeutic activities;Balance training;Patient/family education   OT Goals Acute Rehab OT Goals OT Goal Formulation: With patient/family Time For Goal Achievement: 05/18/12 Potential to Achieve Goals: Good ADL Goals Pt Will Perform Grooming: with modified independence;Standing at sink ADL Goal: Grooming - Progress: Goal set today Pt Will Perform Upper Body Bathing: with modified independence;Sitting, chair;Sitting, edge of bed ADL Goal: Upper Body Bathing - Progress: Goal set today Pt Will Perform Lower Body Bathing: with modified independence;Sit to stand from chair;Sit to stand from bed ADL Goal: Lower Body Bathing - Progress: Goal set today Pt Will Perform Upper Body Dressing: with modified independence;Sitting, chair;Sitting, bed ADL Goal: Upper Body Dressing - Progress: Goal set today Pt Will Perform Lower Body Dressing: with modified independence;Sit to stand from chair;Sit to stand from bed ADL Goal: Lower Body Dressing - Progress: Goal set today Pt Will Transfer to Toilet: with modified independence;Ambulation;Comfort height toilet ADL Goal: Toilet Transfer - Progress: Goal set today Pt Will Perform Toileting - Clothing Manipulation: with modified independence;Standing ADL Goal: Toileting - Clothing Manipulation - Progress: Goal set today Pt Will Perform Tub/Shower Transfer: with modified independence;Ambulation;Shower seat with back ADL Goal: Tub/Shower Transfer - Progress: Goal set today  Visit Information  Last OT Received On: 05/04/12 Assistance Needed:  +1 PT/OT Co-Evaluation/Treatment: Yes    Subjective Data  Subjective: "I'm a little dizzy" Patient Stated Goal: To go home   Prior  Functioning     Home Living Lives With: Alone Available Help at Discharge: Family;Available PRN/intermittently (only available to check on him intermittently) Type of Home: House Home Access: Stairs to enter [employer] of Steps: 1 Entrance Stairs-Rails: None Home Layout: One level Bathroom Shower/Tub: Health visitor: Standard Bathroom Accessibility: Yes How Accessible: Accessible via walker Home Adaptive Equipment: None Prior Function Level of Independence: Independent Able to Take Stairs?: Yes Driving: Yes Vocation: Retired Musician: No difficulties Dominant Hand: Right         Vision/Perception     Cognition  Overall Cognitive Status: Appears within functional limits for tasks assessed/performed Arousal/Alertness: Awake/alert Orientation Level: Appears intact for tasks assessed Behavior During Session: [facility] for tasks performed    Extremity/Trunk Assessment Right Upper Extremity Assessment RUE ROM/Strength/Tone: WFL for tasks assessed RUE Coordination: WFL - gross/fine motor Left Upper Extremity Assessment LUE ROM/Strength/Tone: WFL for tasks assessed LUE Coordination: WFL - gross/fine motor Trunk Assessment Trunk Assessment: Normal     Mobility Bed Mobility Bed Mobility: Supine to Sit;Sitting - Scoot to Edge of Bed;Sit to Supine Supine to Sit: 5: Supervision;With rails Sitting - Scoot to Edge of Bed: 5: Supervision Details for Bed Mobility Assistance: mild struggle to EOB, but after 1-2 tries mustered enough momentum to get up Transfers Sit to Stand: With upper extremity assist;4: Min guard Stand to Sit: With upper extremity assist;To bed;4: Min guard Details for Transfer Assistance: Pt. complaint of diziness with ambulation and transitional movements     Shoulder  Instructions     Exercise     Balance Balance Balance Assessed: Yes Static Sitting Balance Static Sitting - Balance Support: Feet supported;No upper extremity supported Static Sitting - Level of Assistance: 7: Independent   End of Session OT - End of Session Activity Tolerance: Patient tolerated treatment well Patient left: in bed;with call bell/phone within reach;with family/visitor present  GO     Silas Sedam, Ursula Alert M 05/04/2012, 5:40 PM

## 2012-05-04 NOTE — Progress Notes (Signed)
Physical Therapy Treatment Patient Details Name: Wesley [name] MRN: [id number] DOB: 1931/09/27 Today's Date: 05/04/2012 Time: 9562-1308 PT Time Calculation (min): 38 min  PT Assessment / Plan / Recommendation Comments on Treatment Session  Although pt is generally steady with walker, due to the medicine changes, pt on coumadin and the fact he lives alone, pt could benefit from ST stent of rehab.Marland Kitchen  Extensive discussion on safety issues and benefits of a short term rehab stay.    Follow Up Recommendations  SNF     Does the patient have the potential to tolerate intense rehabilitation     Barriers to Discharge        Equipment Recommendations  Rolling walker with 5" wheels;3 in 1 bedside comode    Recommendations for Other Services    Frequency Min 3X/week   Plan Discharge plan needs to be updated;Frequency remains appropriate    Precautions / Restrictions Precautions Precautions: Fall Restrictions Weight Bearing Restrictions: No   Pertinent Vitals/Pain     Mobility  Bed Mobility Bed Mobility: Supine to Sit;Sitting - Scoot to Edge of Bed;Sit to Supine Supine to Sit: 5: Supervision;With rails Sitting - Scoot to Edge of Bed: 5: Supervision Details for Bed Mobility Assistance: mild struggle to EOB, but after 1-2 tries mustered enough momentum to get up Transfers Transfers: Sit to Stand;Stand to Sit Sit to Stand: With upper extremity assist;4: Min guard Stand to Sit: With upper extremity assist;To bed;4: Min guard Details for Transfer Assistance: Pt. complaint of diziness with ambulation and transitional movements Ambulation/Gait Ambulation/Gait Assistance: 4: Min guard Ambulation Distance (Feet): 150 Feet Assistive device: Rolling walker Ambulation/Gait Assistance Details: generally steady gait with RW Gait Pattern: Step-through pattern;Decreased stride length Stairs: No    Exercises     PT Diagnosis:    PT Problem List:   PT Treatment Interventions:     PT  Goals Acute Rehab PT Goals PT Goal Formulation: With patient Time For Goal Achievement: 05/09/12 Potential to Achieve Goals: Good PT Goal: Supine/Side to Sit - Progress: Progressing toward goal PT Goal: Sit to Supine/Side - Progress: Progressing toward goal PT Goal: Sit to Stand - Progress: Progressing toward goal PT Goal: Stand to Sit - Progress: Goal set today PT Goal: Ambulate - Progress: Progressing toward goal  Visit Information  Last PT Received On: 05/04/12 Assistance Needed: +1    Subjective Data  Subjective: I was already doing these things by myself... [place] 2 weeks ago. Patient Stated Goal: Home   Cognition  Overall Cognitive Status: Appears within functional limits for tasks assessed/performed Arousal/Alertness: Awake/alert Orientation Level: Appears intact for tasks assessed Behavior During Session: [facility] for tasks performed    Balance  Balance Balance Assessed: Yes Static Sitting Balance Static Sitting - Balance Support: Feet supported;No upper extremity supported Static Sitting - Level of Assistance: 7: Independent  End of Session PT - End of Session Activity Tolerance: Patient tolerated treatment well Patient left: Other (comment);with call bell/phone within reach (EOB) Nurse Communication: Mobility status   GP     Wesley [name], [name] 05/04/2012, 5:36 PM  05/04/2012  [name], PT [phone] (pager)

## 2012-05-04 NOTE — Progress Notes (Addendum)
CARDIAC REHAB PHASE I  PRE:  Rate/Rhythm: 50 SB   BP:  Supine:   Sitting: 16109  Standing:    SaO2: 98 % RA  MODE:  Ambulation: 550 ft   POST:  Rate/Rhythem: 69 SR  BP:  Supine:   Sitting: 60454  Standing:    SaO2: 96 Patient ambulated x2 assit with gait belt as well as a rolling walker. Patient very unsteady. Patient repeatedly said his balance has been bad since his stroke 3 years ago. Patient also stated he had weak legs due to the stroke. Patient was encouraged to stay in the middle in the of rolling walker as he was ambulating. All discharge education has been completed. Patient denies any CP or dyspnea. Patient denies outpatient cardiac rehab at this time. Brochure was given to patient and he will make his mind up after his discharge. call bell in reach. [phone] and again [phone]. Zoria Rawlinson, Toni Amend

## 2012-05-04 NOTE — Progress Notes (Signed)
ANTICOAGULATION CONSULT NOTE - Follow Up Consult  Pharmacy Consult for heparin and warfarin Indication: hisotry of pulmonary embolus  Labs:  Basename 05/04/12 0635 05/03/12 0530 05/02/12 0915 05/02/12 0455  HGB 12.2* 13.9 -- --  HCT 35.8* 39.2 -- 38.9*  PLT 128* 134* -- 131*  APTT -- -- -- --  LABPROT 17.6* 15.6* -- 15.0  INR 1.49 1.27 -- 1.20  HEPARINUNFRC 0.48 0.31 0.40 --  CREATININE -- -- -- 1.47*  CKTOTAL -- -- -- --  CKMB -- -- -- --  TROPONINI -- -- -- --    Estimated Creatinine Clearance: 52.6 ml/min (by C-G formula based on Cr of 1.47).   Medications:  Warfarin 5mg  daily pta  Assessment: 78 year old male with extensive cardiac history presents to [facility] with chest pain. He was taken to cath yesterday. PCI to intermediate branch. Patient also with history of pulmonary emboli about 2 years ago and has remained on chronic warfarin therapy. INR was reversed 11/25 and now down to 1.2 this morning, he continues on heparin for bridging. Heparin is within goal range without complications.  Will continue to follow cardiology plan as it may include a pci and patient being off oral anticoagulation. For now will continue heparin and coumadin as ordered.  Goal of Therapy:  INR 2-3 Heparin level 0.3-0.7 units/ml Monitor platelets by anticoagulation protocol: Yes   Plan:  Warfarin 5 mg tonight -daily INR ordered Continue heparin infusion at 1200 units/hr- follow up with am heparin level Continue to monitor H&H and platelets Follow cards plan  Elwin Sleight 05/04/2012,11:14 AM

## 2012-05-04 NOTE — Progress Notes (Addendum)
    Subjective:  No chest pain or dyspnea, anxious to go home. Ambulatory.  Objective:  Vital Signs in the last 24 hours: Temp:  [97.7 F (36.5 C)-98.1 F (36.7 C)] 98.1 F (36.7 C) (11/29 0600) Pulse Rate:  [50-58] 50  (11/29 0600) Resp:  [16-18] 16  (11/29 0600) BP: (101-148)/(51-63) 148/59 mmHg (11/29 0600) SpO2:  [96 %-98 %] 98 % (11/29 0600)  Intake/Output from previous day: 11/28 0701 - 11/29 0700 In: 480 [P.O.:480] Out: 1052 [Urine:1050; Stool:2]  Physical Exam: Pt is alert and oriented, elderly male in NAD HEENT: normal Neck: JVP - normal Lungs: CTA bilaterally CV: irregularly irregular without murmur or gallop Abd: soft, NT, Positive BS, no hepatomegaly Ext: no C/C/E, left radial site ok with some bruising Skin: warm/dry no rash   Lab Results:  Basename 05/04/12 0635 05/03/12 0530  WBC 6.3 6.5  HGB 12.2* 13.9  PLT 128* 134*    Basename 05/02/12 0455  NA 137  K 3.9  CL 104  CO2 25  GLUCOSE 182*  BUN 17  CREATININE 1.47*   No results found for this basename: TROPONINI:2,CK,MB:2 in the last 72 hours  Cardiac Studies: 2D Echo: Left ventricle: The cavity size was normal. Wall thickness was normal. Systolic function was mildly to moderately reduced. The estimated ejection fraction was in the range of 40% to 45%.  ------------------------------------------------------------ Aortic valve: Structurally normal valve. Cusp separation was normal. Doppler: Transvalvular velocity was within the normal range. There was no stenosis. No regurgitation.  ------------------------------------------------------------ Aorta: Ascending aorta: The ascending aorta was mildly dilated.  ------------------------------------------------------------ Mitral valve: Structurally normal valve. Leaflet separation was normal. Doppler: Transvalvular velocity was within the normal range. There was no evidence for stenosis. No regurgitation. Peak gradient: 3mm Hg  (D).  ------------------------------------------------------------ Left atrium: The atrium was normal in size.  ------------------------------------------------------------ Right ventricle: The cavity size was normal. Wall thickness was normal. Systolic function was normal.  ------------------------------------------------------------ Pulmonic valve: Doppler: No significant regurgitation.  ------------------------------------------------------------ Tricuspid valve: Doppler: No significant regurgitation.  ------------------------------------------------------------ Right atrium: The atrium was normal in size.  ------------------------------------------------------------ Pericardium: There was no pericardial effusion.  Tele: Sinus rhythm, personally reviewed.  Assessment/Plan:  1. Unstable angina. S/p PCI  of ramus branch thru SVG. Pt with diffuse severe small vessel disease.  The best strategy is ongoing medical therapy of his residual CAD. Will continue heparin/coumadin/plavix without aspirin. Considering he wasn't treated with a stent, I would probably limit his plavix to 30 days considering gait instability and need for long-term warfarin. Will add long term nitrate. On low dose carvedilol but titration limited by bradycardia.   2. Hx PE - on chronic coumadin, INR 1.49. PE was 3 years ago. I think he is stable for discharge as INR approaches 2. Hopefully tomorrow.  3. Gait instability - PT consult- home PT recommended. Rolling walker and bedside commode.  4. Hypertension - labile with episodes of orthostasis  Dispo: continue PT, continue heparin bridge while patient in hospital, ok for home when INR approaching 2.0  Peter Swaziland, M.D.,FACC 05/04/2012, 8:07 AM   Addendum: I spoke with case manager. Patient is not progressing well with Rehab or PT. Family requests short term Rehab. Given high risk of falls I think this is appropriate.  Peter Swaziland MD, [facility]

## 2012-05-04 NOTE — Progress Notes (Signed)
05/04/2012 8:13 AM Nursing notes Pt. HR SB mid 50's all evening. BP 151/59. Dr. Swaziland on floor. Verbal orders received ok for pt. To receive coreg as scheduled. Orders enacted. Will continue to closely monitor patient.  Tishana Clinkenbeard, Blanchard Kelch

## 2012-05-04 NOTE — Care Management Note (Unsigned)
    Page 1 of 1   05/04/2012     5:17:13 PM   CARE MANAGEMENT NOTE 05/04/2012  Patient:  Wesley [name],Wesley [name]   Account Number:  [id number]  Date Initiated:  05/04/2012  Documentation initiated by:  Wilhelmine Krogstad  Subjective/Objective Assessment:   PT S/P NSTEMI ; ADM ON 04/29/12.  PTA, PT LIVES AT HOME ALONE.  HE HAS A SUPPORTIVE DAUGHTER AT BEDSIDE.  SHE LIVES 4 HRS AWAY.     Action/Plan:   MET WITH PT AND DAUGHTER TO DISCUSS DC PLANS.  DAUGHTER CONCERNED AS PT HAD CVA 19YRS AGO AND NEVER RECEIVED REHAB POST EVENT.  HE STILL HAS ISSUES WITH BALANCE AND MOBILITY.   Anticipated DC Date:  05/07/2012   Anticipated DC Plan:  SKILLED NURSING FACILITY  In-house referral  Clinical Social Worker         Choice offered to / List presented to:             Status of service:  In process, will continue to follow Medicare Important Message given?   (If response is "NO", the following Medicare IM given date fields will be blank) Date Medicare IM given:   Date Additional Medicare IM given:    Discharge Disposition:    Per UR Regulation:  Reviewed for med. necessity/level of care/duration of stay  If discussed at Long Length of Stay Meetings, dates discussed:    Comments:  05/04/12 Rosalita Chessman [phone] PT/OT VISITED PATIENT TODAY...FEEL HE COULD BENEFIT FROM SHORT TERM SNF FOR REHAB PRIOR TO DC.  PT AGREEABLE, BUT CONCERNED ABOUT SNF COSTS.   NOTIFIED DR Swaziland, WHO STATES PT LIKELY NEEDS 1-2 MORE DAYS UNTIL INR THERAPEUTIC.  WILL CONSULT CSW TO FACILITATE DC TO SNF WHEN MEDICALLY STABLE FOR DC.  PT/FAMILY PREFER [facility]. ENCOURAGE DAUGHTER Wesley [name] [phone]) TO VISIT OR CALL FACILITY.  SHE VERB UNDERSTANDING.

## 2012-05-05 DIAGNOSIS — I200 Unstable angina: Secondary | ICD-10-CM

## 2012-05-05 LAB — GLUCOSE, CAPILLARY: Glucose-Capillary: 152 mg/dL — ABNORMAL HIGH (ref 70–99)

## 2012-05-05 LAB — CBC
Hemoglobin: 12.7 g/dL — ABNORMAL LOW (ref 13.0–17.0)
MCH: 30.5 pg (ref 26.0–34.0)
MCV: 88 fL (ref 78.0–100.0)
RBC: 4.17 MIL/uL — ABNORMAL LOW (ref 4.22–5.81)

## 2012-05-05 LAB — HEPARIN LEVEL (UNFRACTIONATED): Heparin Unfractionated: 0.75 IU/mL — ABNORMAL HIGH (ref 0.30–0.70)

## 2012-05-05 MED ORDER — HEPARIN BOLUS VIA INFUSION
1500.0000 [IU] | Freq: Once | INTRAVENOUS | Status: AC
  Administered 2012-05-05: 1500 [IU] via INTRAVENOUS
  Filled 2012-05-05: qty 1500

## 2012-05-05 MED ORDER — WARFARIN SODIUM 6 MG PO TABS
6.0000 mg | ORAL_TABLET | Freq: Once | ORAL | Status: AC
  Administered 2012-05-05: 6 mg via ORAL
  Filled 2012-05-05: qty 1

## 2012-05-05 NOTE — Progress Notes (Signed)
ANTICOAGULATION CONSULT NOTE - Follow Up Consult  Pharmacy Consult for heparin and warfarin Indication: history of pulmonary embolus  Labs:  Basename 05/05/12 1334 05/05/12 0430 05/04/12 0635 05/03/12 0530  HGB -- 12.7* 12.2* --  HCT -- 36.7* 35.8* 39.2  PLT -- 132* 128* 134*  APTT -- -- -- --  LABPROT -- 19.9* 17.6* 15.6*  INR -- 1.76* 1.49 1.27  HEPARINUNFRC 0.75* 0.23* 0.48 --  CREATININE -- -- -- --  CKTOTAL -- -- -- --  CKMB -- -- -- --  TROPONINI -- -- -- --    Estimated Creatinine Clearance: 52.1 ml/min (by C-G formula based on Cr of 1.47).   Medications:  Warfarin 5mg  daily pta  Assessment: 78 year old male with extensive cardiac history presents to [facility] with chest pain. He was taken to cath. PCI to intermediate branch. Patient also with history of pulmonary emboli about 2 years ago and has remained on chronic warfarin therapy.  INR (1.76) is below-goal but trending up and heparin level is now supratherapeutic after rate adjustment this morning.  Goal of Therapy:  INR 2-3 Heparin level 0.3-0.7 units/ml Monitor platelets by anticoagulation protocol: Yes   Plan:  1. Decrease IV heparin to 1300 units/hr. 2. Heparin level in 8 hours.  3. Coumadin 6mg  po today.  Estella Husk, Pharm.D., BCPS Clinical Pharmacist  Phone [phone] Pager [phone] 05/05/2012, 2:38 PM

## 2012-05-05 NOTE — Progress Notes (Signed)
Patient Name: Wesley [name] Date of Encounter: 05/05/2012  Primary cardiologist: Dr. Cassell Clement   SUBJECTIVE: No chest pain, no SOB. Discussed process for getting into rehab facility.  OBJECTIVE Filed Vitals:   05/04/12 1354 05/04/12 1707 05/04/12 1930 05/05/12 0319  BP: 136/60 122/65 130/57 162/61  Pulse: 54 62 56 52  Temp: 98.2 F (36.8 C)  98 F (36.7 C) 98.3 F (36.8 C)  TempSrc: Oral  Oral Oral  Resp: 18  18 18   Height:      Weight:    219 lb (99.338 kg)  SpO2: 98%  98% 98%    Intake/Output Summary (Last 24 hours) at 05/05/12 1030 Last data filed at 05/05/12 0959  Gross per 24 hour  Intake    240 ml  Output    800 ml  Net   -560 ml   Filed Weights   04/30/12 0600 05/02/12 0013 05/05/12 0319  Weight: 229 lb 4.5 oz (104 kg) 223 lb 1.7 oz (101.2 kg) 219 lb (99.338 kg)     PHYSICAL EXAM General: Well developed, well nourished, male in no acute distress. Head: Normocephalic, atraumatic.  Neck: Supple without bruits, JVD not elevated. Lungs:  Resp regular and unlabored, CTA. Heart: RRR, S1, S2, no S3, S4, soft systolic murmur. Abdomen: Soft, non-tender, non-distended, BS + x 4.  Extremities: No clubbing, cyanosis, no edema.  Neuro: Alert and oriented X 3. Moves all extremities spontaneously. Psych: Normal affect.  LABS: CBC:  Basename 05/05/12 0430 05/04/12 0635  WBC 6.7 6.3  NEUTROABS -- --  HGB 12.7* 12.2*  HCT 36.7* 35.8*  MCV 88.0 87.7  PLT 132* 128*   INR:  Basename 05/05/12 0430  INR 1.76*   TELE:   SR  Current Medications:     . carvedilol  6.25 mg Oral BID WC  . clopidogrel  75 mg Oral Q breakfast  . [COMPLETED] heparin  1,500 Units Intravenous Once  . insulin aspart  0-15 Units Subcutaneous TID WC  . isosorbide mononitrate  60 mg Oral Daily  . omega-3 acid ethyl esters  2 g Oral Daily  . simvastatin  40 mg Oral q1800  . [COMPLETED] warfarin  5 mg Oral ONCE-1800  . warfarin  6 mg Oral ONCE-1800  . Warfarin - Pharmacist  Dosing Inpatient   Does not apply q1800      . heparin 1,400 Units/hr (05/05/12 4098)    ASSESSMENT AND PLAN: 1. Unstable angina. S/p PCI of ramus branch thru SVG. Pt with diffuse severe small vessel disease. The best strategy is ongoing medical therapy of his residual CAD. Will continue heparin/coumadin/plavix without aspirin. Considering he wasn't treated with a stent, I would probably limit his plavix to 30 days considering gait instability and need for long-term warfarin. Will add long term nitrate. On low dose carvedilol but titration limited by bradycardia.   2. Hx PE - on chronic coumadin, INR 1.49. PE was 3 years ago. I think he is stable for discharge as INR approaches 2.     3. Gait instability - PT consult- home PT recommended. Rolling walker and bedside commode. He agrees to in-pt rehab facility for 2 weeks, case mgt has seen, will get SW involved.  4. Hypertension - labile with episodes of orthostasis, no symptoms but with orthostasis, no med changes.  Signed, Theodore Demark , PA-C 10:30 AM 05/05/2012  Attending note:  Patient seen and examined. He has been stable from a cardiac perspective status post PCI of the ramus intermedius through  SVG. Otherwise has been managed medically, on Plavix (no aspirin) and Coumadin with prior history of pulmonary embolus,  Heparin overlap with recent INR 1.7. Main issue now is arranging rehabilitation, case manager and family involved in the process.  Jonelle Sidle, M.D., F.A.C.C.

## 2012-05-05 NOTE — Progress Notes (Signed)
Cardiac rehab phase 1035-1102    CARDIAC REHAB PHASE I   PRE:  Rate/Rhythm: Sinus Brady 55  BP:  Supine:   Sitting: 138/62  Standing:    SaO2: 97 % RA  MODE:  Ambulation: 250 ft   POST:  Rate/Rhythem: 59 SB  BP:  Supine:   Sitting: 118/58  Standing:    SaO2: 97 % RA  Patient ambulated in the hallway without complaints or chest pain. Gait belt used with RW with assistance times two. Gait relatively steady today.  Mr. Divis is disappointed he can't be discharged today.  Patient assisted back to bed with call bell within reach.  Harlon Flor, Arta Bruce

## 2012-05-05 NOTE — Progress Notes (Signed)
ANTICOAGULATION CONSULT NOTE - Follow Up Consult  Pharmacy Consult for heparin and warfarin Indication: hisotry of pulmonary embolus  Labs:  Basename 05/05/12 0430 05/04/12 0635 05/03/12 0530  HGB 12.7* 12.2* --  HCT 36.7* 35.8* 39.2  PLT 132* 128* 134*  APTT -- -- --  LABPROT 19.9* 17.6* 15.6*  INR 1.76* 1.49 1.27  HEPARINUNFRC 0.23* 0.48 0.31  CREATININE -- -- --  CKTOTAL -- -- --  CKMB -- -- --  TROPONINI -- -- --    Estimated Creatinine Clearance: 52.1 ml/min (by C-G formula based on Cr of 1.47).   Medications:  Warfarin 5mg  daily pta  Assessment: 78 year old male with extensive cardiac history presents to [facility] with chest pain. He was taken to cath . PCI to intermediate branch. Patient also with history of pulmonary emboli about 2 years ago and has remained on chronic warfarin therapy.  INR (1.76) is below-goal but trending up and heparin level (0.23) is below-goal on 1200 units/hr. No problem with line / infusion per RN.   Will continue to follow cardiology plan as it may include a pci and patient being off oral anticoagulation. For now will continue heparin and coumadin as ordered.  Heparin level  Goal of Therapy:  INR 2-3 Heparin level 0.3-0.7 units/ml Monitor platelets by anticoagulation protocol: Yes   Plan:  1. Heparin IV bolus of 1500 units x 1, then increase IV heparin to 1400 units/hr. 2. Heparin level in 8 hours.  3. Coumadin 6mg  po today.  Emeline Gins 05/05/2012,6:09 AM

## 2012-05-06 LAB — CBC
Hemoglobin: 12.6 g/dL — ABNORMAL LOW (ref 13.0–17.0)
MCH: 30.4 pg (ref 26.0–34.0)
MCHC: 34.7 g/dL (ref 30.0–36.0)
Platelets: 130 10*3/uL — ABNORMAL LOW (ref 150–400)

## 2012-05-06 LAB — GLUCOSE, CAPILLARY
Glucose-Capillary: 166 mg/dL — ABNORMAL HIGH (ref 70–99)
Glucose-Capillary: 150 mg/dL — ABNORMAL HIGH (ref 70–99)

## 2012-05-06 LAB — HEPARIN LEVEL (UNFRACTIONATED): Heparin Unfractionated: 0.64 IU/mL (ref 0.30–0.70)

## 2012-05-06 LAB — PROTIME-INR: Prothrombin Time: 20.9 seconds — ABNORMAL HIGH (ref 11.6–15.2)

## 2012-05-06 MED ORDER — WARFARIN SODIUM 6 MG PO TABS
6.0000 mg | ORAL_TABLET | Freq: Once | ORAL | Status: AC
  Administered 2012-05-06: 6 mg via ORAL
  Filled 2012-05-06: qty 1

## 2012-05-06 NOTE — Progress Notes (Signed)
ANTICOAGULATION CONSULT NOTE - Follow Up Consult  Pharmacy Consult for heparin Indication: history of pulmonary embolus  Labs:  Basename 05/06/12 0036 05/05/12 1334 05/05/12 0430 05/04/12 0635 05/03/12 0530  HGB -- -- 12.7* 12.2* --  HCT -- -- 36.7* 35.8* 39.2  PLT -- -- 132* 128* 134*  APTT -- -- -- -- --  LABPROT -- -- 19.9* 17.6* 15.6*  INR -- -- 1.76* 1.49 1.27  HEPARINUNFRC 0.64 0.75* 0.23* -- --  CREATININE -- -- -- -- --  CKTOTAL -- -- -- -- --  CKMB -- -- -- -- --  TROPONINI -- -- -- -- --    Estimated Creatinine Clearance: 52.1 ml/min (by C-G formula based on Cr of 1.47).  Assessment: 78 year old male with history of pulmonary emboli for anticoagulation Goal of Therapy:  INR 2-3 Heparin level 0.3-0.7 units/ml Monitor platelets by anticoagulation protocol: Yes   Plan:  Continue Heparin at current rate  Follow-up am labs.  Geannie Risen, PharmD, BCPS 05/06/2012, 1:26 AM

## 2012-05-06 NOTE — Progress Notes (Signed)
Patient Name: Wesley [name] Date of Encounter: 05/06/2012  Primary cardiologist: Dr. Cassell Clement  SUBJECTIVE: No chest pain, no SOB. He seems to be aware of his fall risk and the need to be safer before he goes home.  OBJECTIVE Filed Vitals:   05/05/12 1303 05/05/12 1716 05/05/12 2200 05/06/12 0600  BP: 112/67 124/60 128/52 136/68  Pulse: 61 65 54 64  Temp: 98.6 F (37 C)  98.5 F (36.9 C) 98.3 F (36.8 C)  TempSrc: Oral     Resp: 18  17 14   Height:      Weight:      SpO2: 96%  97% 97%    Intake/Output Summary (Last 24 hours) at 05/06/12 0919 Last data filed at 05/06/12 0700  Gross per 24 hour  Intake    816 ml  Output    600 ml  Net    216 ml   Filed Weights   04/30/12 0600 05/02/12 0013 05/05/12 0319  Weight: 229 lb 4.5 oz (104 kg) 223 lb 1.7 oz (101.2 kg) 219 lb (99.338 kg)    PHYSICAL EXAM General: Well developed, well nourished, male in no acute distress. Head: Normocephalic, atraumatic.  Neck: Supple without bruits, JVD not elevated. Lungs:  Resp regular and unlabored, few rales. Heart: RRR, S1, S2, no S3, S4, soft systolic murmur. Abdomen: Soft, non-tender, non-distended, BS + x 4.  Extremities: No clubbing, cyanosis, no edema.  Neuro: Alert and oriented X 3. Moves all extremities spontaneously. Psych: Normal affect.  LABS: CBC: Basename 05/06/12 0515 05/05/12 0430  WBC 6.4 6.7  NEUTROABS -- --  HGB 12.6* 12.7*  HCT 36.3* 36.7*  MCV 87.5 88.0  PLT 130* 132*   INR: Basename 05/06/12 0515  INR 1.88*   TELE:   SR    Current Medications:     . carvedilol  6.25 mg Oral BID WC  . clopidogrel  75 mg Oral Q breakfast  . insulin aspart  0-15 Units Subcutaneous TID WC  . isosorbide mononitrate  60 mg Oral Daily  . omega-3 acid ethyl esters  2 g Oral Daily  . simvastatin  40 mg Oral q1800  . [COMPLETED] warfarin  6 mg Oral ONCE-1800  . warfarin  6 mg Oral ONCE-1800  . Warfarin - Pharmacist Dosing Inpatient   Does not apply q1800      .  heparin 1,300 Units/hr (05/06/12 0031)    ASSESSMENT AND PLAN: 1. Unstable angina/Elevated troponin. S/p PCI of ramus branch thru SVG. Pt with diffuse severe small vessel disease. The best strategy is ongoing medical therapy of his residual CAD. Will continue heparin/coumadin/plavix without aspirin. Considering he wasn't treated with a stent, plan is to limit his plavix to 30 days - considering gait instability and need for long-term warfarin. Will add long term nitrate. On low dose carvedilol but titration limited by bradycardia.   2. Hx PE - on chronic coumadin, INR 1.88. PE was 3 years ago. Should be stable for discharge.  3. Gait instability - PT consult- home PT recommended. Rolling walker and bedside commode. He agrees to in-pt rehab facility for 2 weeks, case mgt has seen, will get SW involved. OK to d/c to rehab once arrangements made.  4. Hypertension, controlled.   Signed, Theodore Demark , PA-C 9:19 AM 05/06/2012  Attending note:  Patient seen and examined. No significant cardiac change, overall stable status post PCI of the ramus intermedius through SVG. INR has increased to 1.88. Main issue now is arranging inpatient rehabilitation,  case manager and family involved in the process. No changes made to current regimen, blood pressure has been stable. Please note above recommendations regarding limiting Plavix treatment to 30 days, since plan is long-term Coumadin.  Jonelle Sidle, M.D., F.A.C.C.

## 2012-05-06 NOTE — Progress Notes (Signed)
ANTICOAGULATION CONSULT NOTE - Follow Up Consult  Pharmacy Consult for Heparin / Coumadin Indication: history of pulmonary embolus  Labs:  Basename 05/06/12 0515 05/06/12 0036 05/05/12 1334 05/05/12 0430 05/04/12 0635  HGB 12.6* -- -- 12.7* --  HCT 36.3* -- -- 36.7* 35.8*  PLT 130* -- -- 132* 128*  APTT -- -- -- -- --  LABPROT 20.9* -- -- 19.9* 17.6*  INR 1.88* -- -- 1.76* 1.49  HEPARINUNFRC 0.41 0.64 0.75* -- --  CREATININE -- -- -- -- --  CKTOTAL -- -- -- -- --  CKMB -- -- -- -- --  TROPONINI -- -- -- -- --    Estimated Creatinine Clearance: 52.1 ml/min (by C-G formula based on Cr of 1.47).  Assessment: 78 year old male with history of pulmonary emboli for anticoagulation  INR approaching goal rate  Heparin level therapeutic  Goal of Therapy:  INR 2-3 Heparin level 0.3-0.7 units/ml Monitor platelets by anticoagulation protocol: Yes   Plan:  Continue heparin at 1300 units / hr Coumadin 6 mg po x 1 today Follow-up am labs.  Thank you. Okey Regal, PharmD [phone] 05/06/2012, 8:30 AM

## 2012-05-07 ENCOUNTER — Encounter (HOSPITAL_COMMUNITY): Payer: Self-pay | Admitting: Nurse Practitioner

## 2012-05-07 LAB — CBC
HCT: 36.1 % — ABNORMAL LOW (ref 39.0–52.0)
MCH: 31.6 pg (ref 26.0–34.0)
MCHC: 36 g/dL (ref 30.0–36.0)
MCV: 87.6 fL (ref 78.0–100.0)
Platelets: 125 10*3/uL — ABNORMAL LOW (ref 150–400)
RDW: 13.3 % (ref 11.5–15.5)
WBC: 6.9 10*3/uL (ref 4.0–10.5)

## 2012-05-07 LAB — GLUCOSE, CAPILLARY
Glucose-Capillary: 201 mg/dL — ABNORMAL HIGH (ref 70–99)
Glucose-Capillary: 171 mg/dL — ABNORMAL HIGH (ref 70–99)

## 2012-05-07 LAB — HEPARIN LEVEL (UNFRACTIONATED): Heparin Unfractionated: 0.53 IU/mL (ref 0.30–0.70)

## 2012-05-07 MED ORDER — CARVEDILOL 6.25 MG PO TABS: 6.2500 mg | ORAL_TABLET | Freq: Two times a day (BID) | ORAL | Status: DC

## 2012-05-07 MED ORDER — ISOSORBIDE MONONITRATE ER 60 MG PO TB24: 60.0000 mg | ORAL_TABLET | Freq: Every day | ORAL | Status: DC

## 2012-05-07 MED ORDER — CLOPIDOGREL BISULFATE 75 MG PO TABS: 75.0000 mg | ORAL_TABLET | Freq: Every day | ORAL | Status: DC

## 2012-05-07 MED ORDER — NITROGLYCERIN 0.4 MG SL SUBL: 0.4000 mg | SUBLINGUAL_TABLET | SUBLINGUAL | Status: DC | PRN

## 2012-05-07 MED ORDER — WARFARIN SODIUM 5 MG PO TABS
5.0000 mg | ORAL_TABLET | Freq: Every day | ORAL | Status: DC
  Administered 2012-05-07: 5 mg via ORAL
  Filled 2012-05-07: qty 1

## 2012-05-07 NOTE — Progress Notes (Signed)
CARDIAC REHAB PHASE I   PRE:  Rate/Rhythm: 59SB  BP:  Supine: 140/50  Sitting:   Standing:    SaO2: 97%RA  MODE:  Ambulation: 500 ft   POST:  Rate/Rhythem: 68  BP:  Supine:   Sitting: 130/54  Standing:    SaO2: 97%RA [id number] Pt walked 500 ft on RA with rolling walker and gait belt use with asst x 1. Encouraged pt to stay close to walker during walk. Had tendency to get walker out too far when he made turn. Tolerated well. Denied dizziness. To recliner after walk with call bell. Daughters in room with pt.  Duanne Limerick

## 2012-05-07 NOTE — Discharge Summary (Signed)
Patient ID: Wesley [name],  MRN: [id number], DOB/AGE: [birth year] 78 y.o.  Admit date: 04/29/2012 Discharge date: 05/07/2012  Primary Care Provider: Herb Grays Primary Cardiologist: Wylene Simmer, MD  Discharge Diagnoses Principal Problem:  *Angina pectoris, unstable  **S/p PTCA of the native Ramus Intermedius via the vein graft during this admission. Active Problems:  Coronary artery disease  Elevated troponin  **Elevated POC Troponin with otw nl troponins.  Hx pulmonary embolism  **Chronic coumadin  Diabetes mellitus  Hypertension, uncontrolled  Calf DVT (deep venous thrombosis)  History of CVA (cerebrovascular accident)  CKD (chronic kidney disease), stage III  Allergies Allergies  Allergen Reactions  . Doxycycline     swelling  . Niacin And Related Other (See Comments)    Hot flashes   Procedures  Cardiac Catheterization and Percutaneous Coronary Intervention 05/01/2012  PROCEDURAL FINDINGS Hemodynamics: AO 149/60 with a mean of 91 LV 149/17              Coronary angiography: Coronary dominance: right  Left mainstem: The left main is moderately diseased with diffuse 50% stenosis. Left anterior descending (LAD): The LAD is totally occluded after the first septal perforator. The first diagonal branch is severely diseased in a diffuse fashion with proximal and mid vessel 80-90% stenosis. Left circumflex (LCx): The left circumflex is totally occluded. Right coronary artery (RCA): The RCA is totally occluded in the proximal portion. Saphenous vein graft to distal RCA: Patent throughout with a smooth 60% stenosis in the mid body of the graft. The distal anastomotic site is patent and the PDA and posterolateral branches are diffusely diseased but both are patent. Saphenous vein graft to obtuse marginal: Patent graft with no significant stenoses. The OM just beyond the distal anastomotic site as 75-80% stenosis into a segmental he diseased branch vessel. There is also severe  disease proximal to the anastomotic site in the native OM and the second obtuse marginal branch fills retrograde from the bypass graft. Saphenous vein graft to intermediate branch is patent throughout the distal portion of the intermediate branch has 99% diffuse stenosis leading into multiple subbranches.   **The native Ramus Intermedius was treated with PTCA via the vein graft.**  LIMA to LAD: Widely patent with no significant stenosis. The native LAD is severely diseased and is a very small vessel with poor outflow. There is 90-95% diffuse stenosis beyond the insertion site of the graft. Left ventriculography: Deferred because of chronic kidney disease _____________  2D Echocardiogram 11.27.2013  Study Conclusions  Left ventricle: The cavity size was normal. Wall thickness was normal. Systolic function was mildly to moderately reduced. The estimated ejection fraction was in the range of 40% to 45%.       _____________  History of Present Illness  78 y/o male with the above problem list. He was in his usual state of health until the 2 days prior to admission when he began to experience exertional chest discomfort.  He presented to [facility] ED on 04/29/2012 at the urging of his family.  In the ED, his ECG was non-acute however initial point-of-care troponin was mildly elevated @ 0.11.  He was admitted for further evaluation.  Hospital Course  Following admission, pt had no further chest pain.  All subsequent troponins were normal.  Given symptoms and similarity to prior angina, decision was made to pursue cardiac catheterization.  As he was on chronic coumadin with therapeutic INR on admission, coumadin was held and he was placed on heparin bridge once his INR fell below 2.0.  He underwent diagnostic catheterization on 11/26, revealing multivessel native CAD with patent grafts.  The native Ramus Intermedius had a severe stenosis distal to the insertion of the vein graft.  This was felt to be  the culprit for his symptoms and was successfully intervened upon using PTCA only (no stenting).  Films were reviewed and it was felt that his residual moderate CAD would be best treated medically.  He was maintained on low dose beta blocker (bradycardia limiting further titration) and also placed on long-acting nitrate therapy.  2D echo was carried out on 11/27, showing an EF of 40-45%.  Pt was not placed on ACE inhibitor or ARB therapy secondary to baseline CKD.  Following PTCA, coumadin and heparin bridging were resumed.  He had no further chest pain.  Of note, pt is not on asa in setting of chronic coumadin and now plavix therapy.  We plan to limit plavix therapy to just 30 days since no stent was placed.  While we awaiting pts INR to become therapeutic, he was evaluated by cardiac rehab, PT, and OT.  Between these services, and pts family, it was felt that pts gait was unsteady and that he would benefit from short term acute rehab.  Case management and social work were consulted and arrangements have been made for discharge to [facility].  His INR is now therapeutic and he is ready for discharge to skilled nursing facility today.  Discharge Vitals Blood pressure 146/56, pulse 56, temperature 98.6 F (37 C), temperature source Oral, resp. rate 14, height 6\' 2"  (1.88 m), weight 219 lb (99.338 kg), SpO2 96.00%.  Filed Weights   04/30/12 0600 05/02/12 0013 05/05/12 0319  Weight: 229 lb 4.5 oz (104 kg) 223 lb 1.7 oz (101.2 kg) 219 lb (99.338 kg)   Labs  CBC  Basename 05/07/12 0500 05/06/12 0515  WBC 6.9 6.4  NEUTROABS -- --  HGB 13.0 12.6*  HCT 36.1* 36.3*  MCV 87.6 87.5  PLT 125* 130*   Basic Metabolic Panel Lab Results  Component Value Date   CREATININE 1.47* 05/02/2012   BUN 17 05/02/2012   NA 137 05/02/2012   K 3.9 05/02/2012   CL 104 05/02/2012   CO2 25 05/02/2012   Cardiac Enzymes Lab Results  Component Value Date   TROPONINI <0.30 04/30/2012   Hemoglobin A1C Lab  Results  Component Value Date   HGBA1C 7.0* 04/29/2012   Fasting Lipid Panel Lab Results  Component Value Date   CHOL 188 04/30/2012   HDL 36* 04/30/2012   LDLCALC 126* 04/30/2012   TRIG 129 04/30/2012   CHOLHDL 5.2 04/30/2012    Thyroid Function Tests Lab Results  Component Value Date   TSH 3.414 04/29/2012   Lab Results  Component Value Date   INR 2.30* 05/07/2012   INR 1.88* 05/06/2012   INR 1.76* 05/05/2012     Disposition  Pt is being discharged home today in good condition.  Follow-up Plans & Appointments  Follow-up Information    Follow up with Cassell Clement, MD. On 06/04/2012. (3:45 PM)    Contact information:   [address] [place] [address] [phone]       Follow up with Herb Grays, MD. (as scheduled)    Contact information:   [address] [place] [address] [phone]       Follow up with INR Check. On 05/10/2012. (To be followed by Dr. Collins Scotland)        Discharge Medications  Medication List     As of 05/07/2012  9:51 AM    TAKE these medications         carvedilol 6.25 MG tablet   Commonly known as: COREG   Take 1 tablet (6.25 mg total) by mouth 2 (two) times daily with a meal.      clopidogrel 75 MG tablet   Commonly known as: PLAVIX   Take 1 tablet (75 mg total) by mouth daily with breakfast.      diphenhydramine-acetaminophen 25-500 MG Tabs   Commonly known as: TYLENOL PM   Take 2 tablets by mouth at bedtime.      FISH OIL PO   Take 2 capsules by mouth daily.      insulin glargine 100 UNIT/ML injection   Commonly known as: LANTUS   Inject 25 Units into the skin every morning.      isosorbide mononitrate 60 MG 24 hr tablet   Commonly known as: IMDUR   Take 1 tablet (60 mg total) by mouth daily.      nitroGLYCERIN 0.4 MG SL tablet   Commonly known as: NITROSTAT   Place 1 tablet (0.4 mg total) under the tongue every 5 (five) minutes x 3 doses as needed for chest pain.       pravastatin 40 MG tablet   Commonly known as: PRAVACHOL   Take 80 mg by mouth daily.      warfarin 5 MG tablet   Commonly known as: COUMADIN   Take 5 mg by mouth every evening. As directed by Dr. Collins Scotland      Outstanding Labs/Studies  INR on 05/10/2012 - Please fax results to Dr. Collins Scotland for management.  Duration of Discharge Encounter   Greater than 30 minutes including physician time.  Signed, Nicolasa Ducking NP 05/07/2012, 9:51 AM

## 2012-05-07 NOTE — Progress Notes (Signed)
Subjective:  Patient feels well.  No chest pain or dyspnea. Rhythm is stable. INR is therapeutic. Anticipating bed ready at [facility] today.  Objective:  Vital Signs in the last 24 hours: Temp:  [97.9 F (36.6 C)-98.6 F (37 C)] 98.6 F (37 C) (12/02 0600) Pulse Rate:  [54-56] 55  (12/02 0600) Resp:  [14-16] 14  (12/02 0600) BP: (110-151)/(55-60) 151/60 mmHg (12/02 0600) SpO2:  [96 %-100 %] 96 % (12/02 0600)  Intake/Output from previous day: 12/01 0701 - 12/02 0700 In: 1012 [P.O.:960; I.V.:52] Out: 1100 [Urine:1100] Intake/Output from this shift:       . carvedilol  6.25 mg Oral BID WC  . clopidogrel  75 mg Oral Q breakfast  . insulin aspart  0-15 Units Subcutaneous TID WC  . isosorbide mononitrate  60 mg Oral Daily  . omega-3 acid ethyl esters  2 g Oral Daily  . simvastatin  40 mg Oral q1800  . [COMPLETED] warfarin  6 mg Oral ONCE-1800  . Warfarin - Pharmacist Dosing Inpatient   Does not apply q1800      . heparin 1,300 Units/hr (05/06/12 2042)    Physical Exam: The patient appears to be in no distress.  Head and neck exam reveals that the pupils are equal and reactive.  The extraocular movements are full.  There is no scleral icterus.  Mouth and pharynx are benign.  No lymphadenopathy.  No carotid bruits.  The jugular venous pressure is normal.  Thyroid is not enlarged or tender.  Chest is clear to percussion and auscultation.  No rales or rhonchi.  Expansion of the chest is symmetrical.  Heart reveals no abnormal lift or heave.  First and second heart sounds are normal.  There is no murmur gallop rub or click.  The abdomen is soft and nontender.  Bowel sounds are normoactive.  There is no hepatosplenomegaly or mass.  There are no abdominal bruits.  Extremities reveal no phlebitis or edema.  Pedal pulses are good.  There is no cyanosis or clubbing.  Neurologic exam is normal strength and no lateralizing weakness.  No sensory deficits.  Integument  reveals no rash  Lab Results:  Basename 05/07/12 0500 05/06/12 0515  WBC 6.9 6.4  HGB 13.0 12.6*  PLT 125* 130*   No results found for this basename: NA:2,K:2,CL:2,CO2:2,GLUCOSE:2,BUN:2,CREATININE:2 in the last 72 hours No results found for this basename: TROPONINI:2,CK,MB:2 in the last 72 hours Hepatic Function Panel No results found for this basename: PROT,ALBUMIN,AST,ALT,ALKPHOS,BILITOT,BILIDIR,IBILI in the last 72 hours No results found for this basename: CHOL in the last 72 hours No results found for this basename: PROTIME in the last 72 hours  Imaging: Imaging results have been reviewed  Cardiac Studies: Telemetry shows NSR Assessment/Plan:   Hx pulmonary embolism ()   Assessment: No new symptoms.   Plan: Continue coumadin. INR therapeutic today.  NSTEMI (non-ST elevated myocardial infarction) (04/29/2012)   Assessment: Unstable angina/Elevated troponin. S/p PCI of ramus branch thru SVG. Pt with diffuse severe small vessel disease. The best strategy is ongoing medical therapy of his residual CAD. Will continue heparin/coumadin/plavix without aspirin. Considering he wasn't treated with a stent, plan is to limit his plavix to 30 days - considering gait instability and need for long-term warfarin. Will add long term nitrate. On low dose carvedilol but titration limited by bradycardia.     Plan: To SNF today. INRs will need to be monitored at SNF. After discharge INRs will be monitored at Dr. Alda Berthold office as before.  To see me in office in about one month.   LOS: 8 days    Wesley [name] 05/07/2012, 7:46 AM

## 2012-05-07 NOTE — Progress Notes (Signed)
Utilization review completed.  

## 2012-05-07 NOTE — Progress Notes (Signed)
ANTICOAGULATION CONSULT NOTE - Follow Up Consult  Pharmacy Consult for Heparin / Coumadin Indication: history of pulmonary embolus  Labs:  Basename 05/07/12 0500 05/06/12 0515 05/06/12 0036 05/05/12 0430  HGB 13.0 12.6* -- --  HCT 36.1* 36.3* -- 36.7*  PLT 125* 130* -- 132*  APTT -- -- -- --  LABPROT 24.3* 20.9* -- 19.9*  INR 2.30* 1.88* -- 1.76*  HEPARINUNFRC 0.53 0.41 0.64 --  CREATININE -- -- -- --  CKTOTAL -- -- -- --  CKMB -- -- -- --  TROPONINI -- -- -- --    Estimated Creatinine Clearance: 52.1 ml/min (by C-G formula based on Cr of 1.47).  Assessment: 78 year old male with history of pulmonary emboli for anticoagulation  INR therapeutic  Goal of Therapy:  INR 2-3 Monitor platelets by anticoagulation protocol: Yes   Plan:  D/C heparin  Coumadin 5 mg daily  Thank you. Talbert Cage, PharmD [phone] 05/07/2012, 8:41 AM

## 2012-05-07 NOTE — Clinical Social Work Note (Signed)
Clinical Social Work  Pt is ready for discharge today. Pt and family have chosen [facility], however they will not have a bed available until tomorrow, 05/08/12. CSW provided additional choices of SNF. Pt has discharge orders and will unable to discharge tomorrow, as insurance will not cover an extra night. Pt and family do want to discharge to another SNF and then go to [facility]. Pt's daughter inquired about discharging home and then going to [facility] tomorrow. Pt's daughter shared that shared she and her friend will be with pt consistently and will provide transportation to facility. CSW notified NP and he is agreeable, who will notify MD. MD signed FL2. CSW confirmed discharge plan with RNCM and SW at [facility], all are in agreement. Pt's daughter will provided with packet and pick 1 days worth of medication at pharmacy, as SNF will be ordering meds. CSW confirmed this with pt's pharmacy. CSW is signing off as no further needs identified.   Dede Query, MSW, LCSWA (coverage for Frederico Hamman, MSW, [place]) [phone]

## 2012-05-07 NOTE — Progress Notes (Signed)
Physical Therapy Treatment Patient Details Name: Wesley [name] MRN: [id number] DOB: [birth year] Today's Date: 05/07/2012 Time: 4098-1191 PT Time Calculation (min): 24 min  PT Assessment / Plan / Recommendation Comments on Treatment Session  Pt needing extra encouragement to participate.  Noticeably fatigued, but vitals remained steady.    Follow Up Recommendations  SNF     Does the patient have the potential to tolerate intense rehabilitation     Barriers to Discharge        Equipment Recommendations  Rolling walker with 5" wheels;3 in 1 bedside comode    Recommendations for Other Services    Frequency Min 3X/week   Plan Frequency remains appropriate;Discharge plan remains appropriate    Precautions / Restrictions Precautions Precautions: Fall Restrictions Weight Bearing Restrictions: No   Pertinent Vitals/Pain     Mobility  Bed Mobility Bed Mobility: Supine to Sit;Sitting - Scoot to Edge of Bed Supine to Sit: 5: Supervision;With rails Sitting - Scoot to Edge of Bed: 5: Supervision Details for Bed Mobility Assistance: still mild struggle to EOB, but no assist needed Transfers Transfers: Sit to Stand;Stand to Sit Sit to Stand: 4: Min guard Stand to Sit: 4: Min guard Details for Transfer Assistance: reinforced safe hand placement; no assist needed Ambulation/Gait Ambulation/Gait Assistance: 4: Min guard Ambulation Distance (Feet): 250 Feet Assistive device: Rolling walker Ambulation/Gait Assistance Details: reinforced safe use of the RW, vc's for postural checks and better use of the RW. Gait Pattern: Step-through pattern;Decreased stride length Stairs: No    Exercises     PT Diagnosis:    PT Problem List:   PT Treatment Interventions:     PT Goals Acute Rehab PT Goals PT Goal Formulation: With patient Time For Goal Achievement: 05/09/12 Potential to Achieve Goals: Good PT Goal: Supine/Side to Sit - Progress: Met PT Goal: Sit to Stand - Progress: Progressing  toward goal PT Goal: Stand to Sit - Progress: Progressing toward goal PT Goal: Ambulate - Progress: Progressing toward goal  Visit Information  Last PT Received On: 05/07/12 Assistance Needed: +1    Subjective Data      Cognition  Overall Cognitive Status: Appears within functional limits for tasks assessed/performed Arousal/Alertness: Awake/alert Orientation Level: Appears intact for tasks assessed Behavior During Session: [facility] for tasks performed    Balance  Balance Balance Assessed: No Static Sitting Balance Static Sitting - Balance Support: Feet supported;No upper extremity supported Static Sitting - Level of Assistance: 7: Independent  End of Session PT - End of Session Activity Tolerance: Patient tolerated treatment well Patient left: in chair;with call bell/phone within reach Nurse Communication: Mobility status   GP     Lynnita Somma, [name] 05/07/2012, 3:09 PM  05/07/2012  [place] Bing, PT [phone] (pager)

## 2012-06-04 ENCOUNTER — Ambulatory Visit (INDEPENDENT_AMBULATORY_CARE_PROVIDER_SITE_OTHER): Payer: Medicare Other | Admitting: Cardiology

## 2012-06-04 ENCOUNTER — Encounter: Payer: Self-pay | Admitting: Cardiology

## 2012-06-04 VITALS — BP 133/72 | HR 54 | Resp 18 | Ht 74.0 in | Wt 227.0 lb

## 2012-06-04 DIAGNOSIS — I6350 Cerebral infarction due to unspecified occlusion or stenosis of unspecified cerebral artery: Secondary | ICD-10-CM

## 2012-06-04 DIAGNOSIS — I639 Cerebral infarction, unspecified: Secondary | ICD-10-CM

## 2012-06-04 DIAGNOSIS — I10 Essential (primary) hypertension: Secondary | ICD-10-CM

## 2012-06-04 DIAGNOSIS — Z7901 Long term (current) use of anticoagulants: Secondary | ICD-10-CM

## 2012-06-04 DIAGNOSIS — I259 Chronic ischemic heart disease, unspecified: Secondary | ICD-10-CM

## 2012-06-04 DIAGNOSIS — Z86711 Personal history of pulmonary embolism: Secondary | ICD-10-CM

## 2012-06-04 DIAGNOSIS — I2510 Atherosclerotic heart disease of native coronary artery without angina pectoris: Secondary | ICD-10-CM

## 2012-06-04 NOTE — Patient Instructions (Addendum)
STOP PLAVIX  START ASA 81 MG DAILY  Your physician wants you to follow-up in: 4 month ov/ekg You will receive a reminder letter in the mail two months in advance. If you don't receive a letter, please call our office to schedule the follow-up appointment.

## 2012-06-04 NOTE — Assessment & Plan Note (Signed)
The patient has done well following his cardiac catheterization and percutaneous intervention.  No recurrent chest pain experienced.  He is gradually increasing his activity

## 2012-06-04 NOTE — Assessment & Plan Note (Signed)
The patient is on long-term Coumadin.  This is monitored by Dr. Collins Scotland.  He has not been experiencing any bleeding problems or excessive bruising.

## 2012-06-04 NOTE — Assessment & Plan Note (Signed)
The patient has not been experiencing any symptoms of increasing shortness of breath orthopnea or paroxysmal nocturnal dyspnea.  His echocardiogram done 05/02/12 showed mildly to moderately reduced systolic function with ejection fraction of 40-45%.

## 2012-06-04 NOTE — Progress Notes (Signed)
Wesley [name] Date of Birth:  04/17/1932 [facility] [address] [place], [place]  [address] [phone]         Fax   [phone]  History of Present Illness: This 78 year old gentleman is seen for a post hospital office visit.  He was hospitalized on 04/29/12 and discharged on 05/07/12.  He was admitted with unstable angina pectoris with slightly elevated point-of-care troponin but otherwise normal troponins and with chest pain which was identical to that which he had had prior to his coronary artery bypass graft surgery many years earlier.  The patient has a history of recurrent pulmonary embolism and is on chronic Coumadin.  He has had previous deep vein thrombosis of the calf.  He also has a history of previous cerebrovascular accident.  His cardiac catheterization was delayed until 05/01/12 because of the therapeutic level of Coumadin on board.  At cardiac catheterization the patient was found to have patent saphenous vein grafts to the distal right coronary artery, to the obtuse marginal, and to the intermediate branch.  The native ramus intermedius was felt to be the culprit lesion and was treated with PTCA of the vein graft.  No stent was inserted.  Since then the patient has had no recurrent chest pain.  Current Outpatient Prescriptions  Medication Sig Dispense Refill  . aspirin 81 MG tablet Take 81 mg by mouth daily.      . carvedilol (COREG) 6.25 MG tablet Take 1 tablet (6.25 mg total) by mouth 2 (two) times daily with a meal.  60 tablet  6  . diphenhydramine-acetaminophen (TYLENOL PM) 25-500 MG TABS Take 2 tablets by mouth at bedtime.      . insulin glargine (LANTUS) 100 UNIT/ML injection Inject 25 Units into the skin every morning.       . isosorbide mononitrate (IMDUR) 60 MG 24 hr tablet Take 1 tablet (60 mg total) by mouth daily.  30 tablet  6  . nitroGLYCERIN (NITROSTAT) 0.4 MG SL tablet Place 1 tablet (0.4 mg total) under the tongue every 5 (five) minutes x  3 doses as needed for chest pain.  25 tablet  3  . Omega-3 Fatty Acids (FISH OIL PO) Take 2 capsules by mouth daily.      . pravastatin (PRAVACHOL) 40 MG tablet Take 80 mg by mouth daily.       Marland Kitchen warfarin (COUMADIN) 5 MG tablet Take 5 mg by mouth every evening. As directed by Dr. Collins Scotland        Allergies  Allergen Reactions  . Doxycycline     swelling  . Niacin And Related Other (See Comments)    Hot flashes    Patient Active Problem List  Diagnosis  . Calf DVT (deep venous thrombosis)  . PVD (peripheral vascular disease)  . Coronary artery disease  . Hx pulmonary embolism  . Kidney stone  . Elevated troponin  . Angina pectoris, unstable  . Diabetes mellitus  . History of CVA (cerebrovascular accident)  . CKD (chronic kidney disease), stage III  . Hypertension, uncontrolled    History  Smoking status  . Never Smoker   Smokeless tobacco  . Not on file    History  Alcohol Use No    Family History  Problem Relation Age of Onset  . Stroke Mother     age 35  . Other Father     pulmonary embolus age 15  . Diabetes Sister   . Stroke Sister   . Diabetes Brother  brother was murdered  . Diabetes Sister     Review of Systems: Constitutional: no fever chills diaphoresis or fatigue or change in weight.  Head and neck: no hearing loss, no epistaxis, no photophobia or visual disturbance. Respiratory: No cough, shortness of breath or wheezing. Cardiovascular: No chest pain peripheral edema, palpitations. Gastrointestinal: No abdominal distention, no abdominal pain, no change in bowel habits hematochezia or melena. Genitourinary: No dysuria, no frequency, no urgency, no nocturia. Musculoskeletal:No arthralgias, no back pain, no gait disturbance or myalgias. Neurological: No dizziness, no headaches, no numbness, no seizures, no syncope, no weakness, no tremors. Hematologic: No lymphadenopathy, no easy bruising. Psychiatric: No confusion, no hallucinations, no sleep  disturbance.    Physical Exam: Filed Vitals:   06/04/12 1613  BP: 133/72  Pulse: 54  Resp: 18   the general appearance reveals a well-developed well-nourished gentleman in no distress.The head and neck exam reveals pupils equal and reactive.  Extraocular movements are full.  There is no scleral icterus.  The mouth and pharynx are normal.  The neck is supple.  The carotids reveal no bruits.  The jugular venous pressure is normal.  The  thyroid is not enlarged.  There is no lymphadenopathy.  The chest is clear to percussion and auscultation.  There are no rales or rhonchi.  Expansion of the chest is symmetrical.  The precordium is quiet.  The first heart sound is normal.  The second heart sound is physiologically split.  There is no murmur gallop rub or click.  There is no abnormal lift or heave.  The abdomen is soft and nontender.  The bowel sounds are normal.  The liver and spleen are not enlarged.  There are no abdominal masses.  There are no abdominal bruits.  Extremities reveal weak pedal pulses.  There is no phlebitis or edema.  There is no cyanosis or clubbing.  Strength is normal and symmetrical in all extremities.  There is no lateralizing weakness.  There are no sensory deficits.  The skin is warm and dry.  There is no rash.  EKG shows sinus bradycardia at 52 per minute and no ischemic changes   Assessment / Plan: The patient is to continue present medication except we are now stopping Plavix after 28 days and continuing warfarin and we will add baby aspirin 81 mg daily because of his known residual coronary artery disease.  Recheck in 4 months for followup office visit and EKG.

## 2012-09-04 HISTORY — PX: CARDIAC CATHETERIZATION: SHX172

## 2012-09-07 ENCOUNTER — Telehealth: Payer: Self-pay | Admitting: Cardiology

## 2012-09-07 NOTE — Telephone Encounter (Signed)
Advised pt to continue all meds until d/c'd by Dr. Patty Sermons.

## 2012-09-07 NOTE — Telephone Encounter (Signed)
New problem   Pt meds he have been taking Carvedilol 6.25mg  and Isosorbide 60mg .  Pt need to know if he need to  refill his prescriptions and continue taking since he is feeling well. Please advise pt.

## 2012-09-18 ENCOUNTER — Encounter (HOSPITAL_COMMUNITY): Payer: Self-pay | Admitting: Emergency Medicine

## 2012-09-18 ENCOUNTER — Inpatient Hospital Stay (HOSPITAL_COMMUNITY)
Admission: EM | Admit: 2012-09-18 | Discharge: 2012-09-21 | DRG: 281 | Disposition: A | Discharge status: Home / Self Care | Payer: Medicare Other | Attending: Internal Medicine | Admitting: Internal Medicine

## 2012-09-18 ENCOUNTER — Telehealth: Payer: Self-pay | Admitting: Cardiology

## 2012-09-18 ENCOUNTER — Emergency Department (HOSPITAL_COMMUNITY): Payer: Medicare Other

## 2012-09-18 DIAGNOSIS — I2582 Chronic total occlusion of coronary artery: Secondary | ICD-10-CM | POA: Diagnosis present

## 2012-09-18 DIAGNOSIS — Z7901 Long term (current) use of anticoagulants: Secondary | ICD-10-CM

## 2012-09-18 DIAGNOSIS — Z86711 Personal history of pulmonary embolism: Secondary | ICD-10-CM | POA: Diagnosis present

## 2012-09-18 DIAGNOSIS — I2589 Other forms of chronic ischemic heart disease: Secondary | ICD-10-CM | POA: Diagnosis present

## 2012-09-18 DIAGNOSIS — R7989 Other specified abnormal findings of blood chemistry: Secondary | ICD-10-CM

## 2012-09-18 DIAGNOSIS — I739 Peripheral vascular disease, unspecified: Secondary | ICD-10-CM | POA: Diagnosis present

## 2012-09-18 DIAGNOSIS — I129 Hypertensive chronic kidney disease with stage 1 through stage 4 chronic kidney disease, or unspecified chronic kidney disease: Secondary | ICD-10-CM | POA: Diagnosis present

## 2012-09-18 DIAGNOSIS — Z9861 Coronary angioplasty status: Secondary | ICD-10-CM

## 2012-09-18 DIAGNOSIS — E785 Hyperlipidemia, unspecified: Secondary | ICD-10-CM | POA: Diagnosis present

## 2012-09-18 DIAGNOSIS — Z7982 Long term (current) use of aspirin: Secondary | ICD-10-CM

## 2012-09-18 DIAGNOSIS — Z86718 Personal history of other venous thrombosis and embolism: Secondary | ICD-10-CM

## 2012-09-18 DIAGNOSIS — I214 Non-ST elevation (NSTEMI) myocardial infarction: Secondary | ICD-10-CM

## 2012-09-18 DIAGNOSIS — Z794 Long term (current) use of insulin: Secondary | ICD-10-CM

## 2012-09-18 DIAGNOSIS — I2510 Atherosclerotic heart disease of native coronary artery without angina pectoris: Secondary | ICD-10-CM | POA: Diagnosis present

## 2012-09-18 DIAGNOSIS — I10 Essential (primary) hypertension: Secondary | ICD-10-CM | POA: Diagnosis present

## 2012-09-18 DIAGNOSIS — I209 Angina pectoris, unspecified: Secondary | ICD-10-CM

## 2012-09-18 DIAGNOSIS — Z8673 Personal history of transient ischemic attack (TIA), and cerebral infarction without residual deficits: Secondary | ICD-10-CM

## 2012-09-18 DIAGNOSIS — Z79899 Other long term (current) drug therapy: Secondary | ICD-10-CM

## 2012-09-18 DIAGNOSIS — E119 Type 2 diabetes mellitus without complications: Secondary | ICD-10-CM | POA: Diagnosis present

## 2012-09-18 DIAGNOSIS — N183 Chronic kidney disease, stage 3 unspecified: Secondary | ICD-10-CM | POA: Diagnosis present

## 2012-09-18 DIAGNOSIS — I25810 Atherosclerosis of coronary artery bypass graft(s) without angina pectoris: Secondary | ICD-10-CM | POA: Diagnosis present

## 2012-09-18 DIAGNOSIS — I208 Other forms of angina pectoris: Secondary | ICD-10-CM

## 2012-09-18 HISTORY — DX: Essential (primary) hypertension: I10

## 2012-09-18 LAB — CBC
HCT: 41.6 % (ref 39.0–52.0)
Hemoglobin: 16.1 g/dL (ref 13.0–17.0)
MCH: 31.7 pg (ref 26.0–34.0)
MCHC: 37 g/dL — ABNORMAL HIGH (ref 30.0–36.0)
Platelets: 133 10*3/uL — ABNORMAL LOW (ref 150–400)
Platelets: 135 10*3/uL — ABNORMAL LOW (ref 150–400)
RBC: 5.08 MIL/uL (ref 4.22–5.81)
RDW: 13.1 % (ref 11.5–15.5)
WBC: 6.4 10*3/uL (ref 4.0–10.5)
WBC: 10.3 10*3/uL (ref 4.0–10.5)

## 2012-09-18 LAB — URINE MICROSCOPIC-ADD ON

## 2012-09-18 LAB — COMPREHENSIVE METABOLIC PANEL
ALT: 8 U/L (ref 0–53)
AST: 13 U/L (ref 0–37)
Alkaline Phosphatase: 55 U/L (ref 39–117)
CO2: 25 mEq/L (ref 19–32)
Calcium: 9.2 mg/dL (ref 8.4–10.5)
Chloride: 103 mEq/L (ref 96–112)
GFR calc non Af Amer: 48 mL/min — ABNORMAL LOW (ref >90)
Potassium: 4.1 mEq/L (ref 3.5–5.1)
Sodium: 138 mEq/L (ref 135–145)

## 2012-09-18 LAB — URINALYSIS, ROUTINE W REFLEX MICROSCOPIC
Glucose, UA: NEGATIVE mg/dL
Ketones, ur: NEGATIVE mg/dL
Leukocytes, UA: NEGATIVE
pH: 7 (ref 5.0–8.0)

## 2012-09-18 LAB — TROPONIN I: Troponin I: 1.68 ng/mL (ref <0.30)

## 2012-09-18 LAB — CREATININE, SERUM
Creatinine, Ser: 1.29 mg/dL (ref 0.50–1.35)
GFR calc Af Amer: 59 mL/min — ABNORMAL LOW (ref >90)
GFR calc non Af Amer: 51 mL/min — ABNORMAL LOW (ref >90)

## 2012-09-18 LAB — POCT I-STAT TROPONIN I: Troponin i, poc: 0.07 ng/mL (ref 0.00–0.08)

## 2012-09-18 MED ORDER — INSULIN GLARGINE 100 UNIT/ML ~~LOC~~ SOLN
25.0000 [IU] | Freq: Every morning | SUBCUTANEOUS | Status: DC
  Administered 2012-09-19: 25 [IU] via SUBCUTANEOUS
  Filled 2012-09-18 (×2): qty 0.25

## 2012-09-18 MED ORDER — ZOLPIDEM TARTRATE 5 MG PO TABS: 5.0000 mg | ORAL_TABLET | Freq: Every evening | ORAL | Status: DC | PRN

## 2012-09-18 MED ORDER — SODIUM CHLORIDE 0.9 % IJ SOLN: 3.0000 mL | INTRAMUSCULAR | Status: DC | PRN

## 2012-09-18 MED ORDER — DIPHENHYDRAMINE-APAP (SLEEP) 25-500 MG PO TABS: 2.0000 | ORAL_TABLET | Freq: Every day | ORAL | Status: DC

## 2012-09-18 MED ORDER — OMEGA-3-ACID ETHYL ESTERS 1 G PO CAPS
2.0000 g | ORAL_CAPSULE | Freq: Every day | ORAL | Status: DC
  Administered 2012-09-18 – 2012-09-20 (×3): 2 g via ORAL
  Filled 2012-09-18 (×4): qty 2

## 2012-09-18 MED ORDER — ALPRAZOLAM 0.25 MG PO TABS: 0.2500 mg | ORAL_TABLET | Freq: Two times a day (BID) | ORAL | Status: DC | PRN

## 2012-09-18 MED ORDER — ONDANSETRON HCL 4 MG/2ML IJ SOLN
4.0000 mg | Freq: Four times a day (QID) | INTRAMUSCULAR | Status: DC | PRN
  Administered 2012-09-18: 4 mg via INTRAVENOUS
  Filled 2012-09-18: qty 2

## 2012-09-18 MED ORDER — MORPHINE SULFATE 4 MG/ML IJ SOLN
4.0000 mg | INTRAMUSCULAR | Status: DC | PRN
  Administered 2012-09-18: 4 mg via INTRAVENOUS
  Filled 2012-09-18: qty 1

## 2012-09-18 MED ORDER — SODIUM CHLORIDE 0.9 % IJ SOLN
3.0000 mL | Freq: Two times a day (BID) | INTRAMUSCULAR | Status: DC
  Administered 2012-09-20 (×2): 3 mL via INTRAVENOUS

## 2012-09-18 MED ORDER — ACETAMINOPHEN 500 MG PO TABS
1000.0000 mg | ORAL_TABLET | Freq: Every day | ORAL | Status: DC
  Administered 2012-09-18 – 2012-09-19 (×2): 1000 mg via ORAL
  Filled 2012-09-18 (×3): qty 2

## 2012-09-18 MED ORDER — CARVEDILOL 6.25 MG PO TABS
6.2500 mg | ORAL_TABLET | Freq: Two times a day (BID) | ORAL | Status: DC
  Administered 2012-09-19 – 2012-09-21 (×3): 6.25 mg via ORAL
  Filled 2012-09-18 (×7): qty 1

## 2012-09-18 MED ORDER — NITROGLYCERIN 2 % TD OINT
1.0000 [in_us] | TOPICAL_OINTMENT | Freq: Once | TRANSDERMAL | Status: AC
  Administered 2012-09-18: 1 [in_us] via TOPICAL
  Filled 2012-09-18: qty 1

## 2012-09-18 MED ORDER — ISOSORBIDE MONONITRATE ER 60 MG PO TB24
60.0000 mg | ORAL_TABLET | Freq: Once | ORAL | Status: AC
  Administered 2012-09-18: 60 mg via ORAL
  Filled 2012-09-18: qty 1

## 2012-09-18 MED ORDER — SIMVASTATIN 40 MG PO TABS
40.0000 mg | ORAL_TABLET | Freq: Every day | ORAL | Status: DC
  Administered 2012-09-19 – 2012-09-20 (×2): 40 mg via ORAL
  Filled 2012-09-18 (×3): qty 1

## 2012-09-18 MED ORDER — DIPHENHYDRAMINE HCL 25 MG PO CAPS
50.0000 mg | ORAL_CAPSULE | Freq: Every evening | ORAL | Status: DC | PRN
  Administered 2012-09-18 – 2012-09-19 (×2): 50 mg via ORAL
  Filled 2012-09-18 (×2): qty 2

## 2012-09-18 MED ORDER — ACETAMINOPHEN 325 MG PO TABS: 650.0000 mg | ORAL_TABLET | ORAL | Status: DC | PRN

## 2012-09-18 MED ORDER — OMEGA-3 FATTY ACIDS 1000 MG PO CAPS: 2.0000 g | ORAL_CAPSULE | Freq: Every day | ORAL | Status: DC

## 2012-09-18 MED ORDER — HEPARIN SODIUM (PORCINE) 5000 UNIT/ML IJ SOLN
5000.0000 [IU] | Freq: Three times a day (TID) | INTRAMUSCULAR | Status: DC
  Administered 2012-09-18 – 2012-09-19 (×2): 5000 [IU] via SUBCUTANEOUS
  Filled 2012-09-18 (×8): qty 1

## 2012-09-18 MED ORDER — SODIUM CHLORIDE 0.9 % IV SOLN: 250.0000 mL | INTRAVENOUS | Status: DC | PRN

## 2012-09-18 MED ORDER — ISOSORBIDE MONONITRATE ER 60 MG PO TB24
60.0000 mg | ORAL_TABLET | Freq: Every day | ORAL | Status: DC
  Administered 2012-09-19 – 2012-09-20 (×2): 60 mg via ORAL
  Filled 2012-09-18 (×3): qty 1

## 2012-09-18 MED ORDER — DIPHENHYDRAMINE HCL 50 MG/ML IJ SOLN: 50.0000 mg | Freq: Every evening | INTRAMUSCULAR | Status: DC | PRN

## 2012-09-18 MED ORDER — ASPIRIN 81 MG PO TABS: 81.0000 mg | ORAL_TABLET | Freq: Every day | ORAL | Status: DC

## 2012-09-18 MED ORDER — NITROGLYCERIN 0.4 MG SL SUBL: 0.4000 mg | SUBLINGUAL_TABLET | SUBLINGUAL | Status: DC | PRN

## 2012-09-18 MED ORDER — ASPIRIN EC 81 MG PO TBEC
81.0000 mg | DELAYED_RELEASE_TABLET | Freq: Every day | ORAL | Status: DC
  Administered 2012-09-19 – 2012-09-20 (×2): 81 mg via ORAL
  Filled 2012-09-18 (×4): qty 1

## 2012-09-18 MED ORDER — INSULIN ASPART 100 UNIT/ML ~~LOC~~ SOLN
0.0000 [IU] | Freq: Three times a day (TID) | SUBCUTANEOUS | Status: DC
  Administered 2012-09-20: 2 [IU] via SUBCUTANEOUS

## 2012-09-18 NOTE — ED Notes (Signed)
Per EMS - pt c/o CP that came on at rest 8/10, took 2 Nitro and then rated pain at 2/10 pt too 324 ASA PTA. EMS administered 1 Nitro after that pt denies CP. Pt has cardiac hx.12 lead unremarkable. EMS attempted to gain IV access, unsuccesful. BP 210/100 HR 70 regular. Pt in nad, resp e/u, skin warm and dry.

## 2012-09-18 NOTE — H&P (Signed)
History and Physical   Patient ID: Wesley [name] MRN: [id number], DOB/AGE: 77-May-1933   Admit date: 09/18/2012 Date of Consult: 09/18/2012  Primary Physician: Herb Grays, MD Primary Cardiologist: Wylene Simmer, MD  HPI: Wesley [name] is a 79 y.o. male with PMHx s/f CAD s/p CABG in 2000, ischemic cardiomyopathy (EF 40-45%), history of DVT, h/o recurrent PE (on chronic Coumadin anticoagulation), type 2 DM, HTN, dyslipidemia, PVD and CVA who presents to [facility] ED with chest pain.   He last saw Dr. Patty Sermons on 06/04/12 for posthospital followup. She been discharged on 05/07/12 with suspected unstable angina pectoris. He experienced chest discomfort similar to that endorsed prior to undergoing CABG many years earlier. He underwent diagnostic cardiac catheterization, which was delayed to allow for subtherapeutic INR, on 05/01/12 which revealed severe three-vessel CAD, patent grafts, severe diffuse small vessel disease, moderate SVG-RCA stenosis. He underwent angioplasty alone to 99% ramus intermedius lesion with resultant dissection and 70% restenosis (see full details below)  He reports experiencing sudden onset left-sided chest pressure radiating to his left arm reminiscent of his discomfort preceding CABG occurring around noon today while at rest rated at a 8/10. He denies preceding worsening chest discomfort on exertion. He denies associated symptoms. The chest pain persisted for another hour and the patient called EMS. Upon arrival, he was administered 3 total sublingual nitroglycerin with improvement to 2/10.  In the ED, EKG reveals NSR, new inferior Qs and subtle anterolateral nonspecific ST changes concerning for interval MI, ischemia since 05/2012. Initial trop-I WNL. BMET- Cr 1.36 (baseline 1.4). CBC WNL. INR 2.28. CXR without acute cardiopulmonary process. U/a unremarkable.   Cardiac catheterization 05/01/12: 50% diffuse left main, LAD occlusion, 89% proximal and mid diagonal, totally  occluded left circumflex, totally occluded RCA; patent SVG-RCA with 60% mid stenosis, distal anastomotic site patent and PDA and PLB branches diffusely diseased but patent, SVG-OM patent with 75-80% stenosis, severe disease proximal to the anastomotic site in the native OM, retrograde flow to OM 2 from bypass graft, SVG-intermediate patent throughout the distal portion with 99% diffuse stenosis leading into multiple subbranches, LIMA-LAD widely patent with no significant stenosis with 90-95 diffuse stenosis beyond the insertion of the graft.  Problem List: Past Medical History  Diagnosis Date  . Diabetes mellitus   . Hyperlipidemia   . Carotid artery occlusion   . Calf DVT (deep venous thrombosis)     a. hx of right calf - chronic coumadin  . PVD (peripheral vascular disease)     hx of  . Coronary artery disease     a. s/p MI & CABG in 2000;  b. 2012 Neg MV;  c. 04/2012 Cath/PTCA: LM 50, LAD 100p, D1 80-59m, LCX 100, RCA 100p, VG->dRCA 75m, VG->OM 75-80 w/in OM dist to anast, VG-> RI patent w/ 99 in native RI (PTCA only ), LIMA->LAD ok w/ 90-95 LAD after insertion  . Hx pulmonary embolism     a. bilateral;  b. Chronic coumadin  . Stroke   . Ischemic cardiomyopathy     a. 04/2012 Echo: EF 40-45%.    Past Surgical History  Procedure Laterality Date  . Coronary artery bypass graft  2000    4 vessel cabg in [place], [place].  . Cardiac catheterization    . Carotid endarterectomy  2000    bilateral     Allergies:  Allergies  Allergen Reactions  . Doxycycline     swelling  . Niacin And Related Other (See Comments)    Hot flashes  Home Medications: Prior to Admission medications   Medication Sig Start Date End Date Taking? Authorizing Provider  aspirin 81 MG tablet Take 81 mg by mouth daily.   Yes Historical Provider, MD  carvedilol (COREG) 6.25 MG tablet Take 6.25 mg by mouth 2 (two) times daily with a meal. 05/07/12  Yes Ok Anis, NP  diphenhydramine-acetaminophen  (TYLENOL PM) 25-500 MG TABS Take 2 tablets by mouth at bedtime.   Yes Historical Provider, MD  fish oil-omega-3 fatty acids 1000 MG capsule Take 2 g by mouth daily.   Yes Historical Provider, MD  insulin glargine (LANTUS) 100 UNIT/ML injection Inject 25 Units into the skin every morning.    Yes Historical Provider, MD  isosorbide mononitrate (IMDUR) 60 MG 24 hr tablet Take 60 mg by mouth daily. 05/07/12  Yes Ok Anis, NP  nitroGLYCERIN (NITROSTAT) 0.4 MG SL tablet Place 0.4 mg under the tongue every 5 (five) minutes x 3 doses as needed for chest pain. 05/07/12  Yes Ok Anis, NP  pravastatin (PRAVACHOL) 40 MG tablet Take 80 mg by mouth daily.    Yes Historical Provider, MD  warfarin (COUMADIN) 5 MG tablet Take 5 mg by mouth every evening. As directed by Dr. Collins Scotland   Yes Historical Provider, MD    Inpatient Medications:    (Not in a hospital admission)  Family History  Problem Relation Age of Onset  . Stroke Mother     age 99  . Other Father     pulmonary embolus age 34  . Diabetes Sister   . Stroke Sister   . Diabetes Brother     brother was murdered  . Diabetes Sister      History   Social History  . Marital Status: Married    Spouse Name: N/A    Number of Children: N/A  . Years of Education: N/A   Occupational History  . Not on file.   Social History Main Topics  . Smoking status: Never Smoker   . Smokeless tobacco: Not on file  . Alcohol Use: No  . Drug Use: No  . Sexually Active:    Other Topics Concern  . Not on file   Social History Narrative  . No narrative on file     Review of Systems: General: negative for chills, fever, night sweats or weight changes.  Cardiovascular: positive for chest pain, negative for dyspnea on exertion, edema, orthopnea, palpitations, paroxysmal nocturnal dyspnea or shortness of breath Dermatological: negative for rash Respiratory: negative for cough or wheezing Urologic: negative for hematuria Abdominal:  negative for nausea, vomiting, diarrhea, bright red blood per rectum, melena, or hematemesis Neurologic:  negative for visual changes, syncope, or dizziness All other systems reviewed and are otherwise negative except as noted above.  Physical Exam: Blood pressure 191/75, pulse 59, temperature 98.1 F (36.7 C), temperature source Oral, resp. rate 15, SpO2 99.00%.    General: Well developed, well nourished, in no acute distress. Head: Normocephalic, atraumatic, sclera non-icteric, no xanthomas, nares are without discharge. Neck:  Negative for carotid bruits. JVD not elevated. Lungs: Clear bilaterally to auscultation without wheezes, rales, or rhonchi. Breathing is unlabored. Heart: RRR with S1 S2. No murmurs, rubs, or gallops appreciated. Abdomen:  Soft, non-tender, non-distended with normoactive bowel sounds. No hepatomegaly. No rebound/guarding. No obvious abdominal masses. Msk:  Strength and tone appears normal for age. Extremities: Bilateral LE nonpitting edema. No clubbing or cyanosis.  Distal pedal pulses are trace-1+ and equal bilaterally. Neuro: Alert and oriented  X 3. Moves all extremities spontaneously. Psych:  Responds to questions appropriately with a normal affect.  Labs: Recent Labs     09/18/12  1532  WBC  6.4  HGB  16.1  HCT  43.6  MCV  85.8  PLT  133*   Recent Labs Lab 09/18/12 1532  NA 138  K 4.1  CL 103  CO2 25  BUN 18  CREATININE 1.36*  CALCIUM 9.2  PROT 6.9  BILITOT 0.8  ALKPHOS 55  ALT 8  AST 13  GLUCOSE 150*   Radiology/Studies: Dg Chest 2 View  09/18/2012  *RADIOLOGY REPORT*  Clinical Data: Chest pain, hypertension  CHEST - 2 VIEW  Comparison: Prior chest x-ray 03/30/2011  Findings: Stable linear atelectasis versus scarring in the lingula partially obscuring the left cardiac margin.  Negative for edema, focal airspace consolidation, pleural effusion or pneumothorax.  No suspicious pulmonary nodule.  Atherosclerotic calcifications noted in the  transverse aorta.  Status post median sternotomy with evidence of prior CABG.  Heart size is within normal limits.  No acute osseous abnormality.  IMPRESSION: No acute cardiopulmonary disease.   Original Report Authenticated By: Malachy Moan, M.D.     EKG: NSR/sinus bradycardia, 53 bpm, 1st degree AVB, TW flattening II, III, aVF (unchanged from prior tracings), new small Q waves III, aVF, subtle ST flattening V3-V5 new from prior 11-05/2012 tracings  ASSESSMENT AND PLAN:   79 y.o. male with PMHx s/f CAD s/p CABG in 2000, ischemic cardiomyopathy (EF 40-45%), history of DVT, h/o recurrent PE (on chronic Coumadin anticoagulation), type 2 DM, HTN, dyslipidemia, PVD and CVA who presents to [facility] ED with chest pain.   1. USAP 2. CAD s/p CABG, angioplasty 04/2012, diffuse residual, diabetic CAD 3. Ischemic cardiomyopathy, EF 40-45% 4. H/o DVT/PE  5. Chronic Coumadin anticoagulation 6. Type 2 DM 7. Hypertension 8. Dyslipidemia 9. PVD 10. CVA  The patient presents to [facility] ED today with sudden onset of left-sided chest pressure radiating to his left arm rated at 8/10, reminiscent of prior chest pain leading to CABG and responsive to nitroglycerin. In the ED, EKG reveals new inferior Q waves and anterolateral ST changes. Initial point-of-care trop-I WNL. Euvolemic, pain free in NAD on exam.   He recently underwent catheterization 04/2012 for similar symptoms which revealed severe three-vessel CAD, diabetic heart disease and diffuse residual graft disease. He underwent angioplasty to 90% ramus intermedius lesion which was complicated by dissection and restenosis.  Given his multiple vasculopathies including CAD and multiple cardiac risk factors, plan to hospitalize and cycle cardiac biomarkers. Targeting the culprit lesion accounting for interval MI/ischemia is one option. Revascularizing invasively may prove to be challenging given known diabetic heart disease and evidence of small vessel  disease as well. Another obstacle would be appropriately managing Coumadin pre- and post-cardiac cath. Post cath, she would either require an extended stay in the hospital to allow INR to return to therapeutic level will require Lovenox bridging as an outpatient.    Blood pressure is elevated in the ED he is not taking his antihypertensives as morning. There may also be room to add amlodipine to his antianginal regimen of blood pressure remains uncontrolled despite the above adjustments. Will start on SSI while inpatient.    Signed, R. Hurman Horn, PA-C 09/18/2012, 5:12 PM   The patient has complex long-standing ischemic heart disease. This is as noted above. He comes in today with a chest prominence syndrome similar to a different from before. He describes only chest  tightness but also radiation into his neck and shoulder which is different. It was relieved totally by nitroglycerin x3.  ECG on arrival demonstrates Q waves in the inferior leads that were not present on ECG to November/December 2013 as well as striking ST segment flattening in leads V2-V4.  The decision as to how to proceed however I think is complicated by the known severity of his coronary disease, apparently the diffuse diabetic nature of that disease and the question as to whether there might be appropriate targets or not. We will treat him tonight for his high blood pressure and continue nitroglycerin. We'll hold his Coumadin so to allow a decision to be made by Dr. TB in the morning perhaps in conjunction with interventional. His enzymes are positive that may well force the hand and the decision. Alternatively augmented medical therapy with the addition of calcium blocker and up titration of his nitrates might be sufficient

## 2012-09-18 NOTE — Telephone Encounter (Signed)
New problem    Pt experiencing chest pains x2 hrs-pt has taken 1 nitroglycerine pill and he's not sure if he should take another dose or if he needs to come in-pt is not having any other symptoms except chest and left arm pain--triage picked up call

## 2012-09-18 NOTE — ED Provider Notes (Signed)
History     CSN: [id number]  Arrival date & time 09/18/12  1513   First MD Initiated Contact with Patient 09/18/12 1529      Chief Complaint  Patient presents with  . Chest Pain    (Consider location/radiation/quality/duration/timing/severity/associated sxs/prior treatment) HPI Comments: 79 year old male the past medical history of CAD, MI in 04/2012, CAGB in 2000, PE, DVT, diabetes, hyperlipidemia and stroke presents emergency department via EMS with his daughter complaining of sudden onset left-sided chest pain beginning 3 hours prior to arrival. Patient states the pain just "hurts" rated 8/10, radiating to his left shoulder lasting about 2 hours until EMS arrived there he was given 2 sublingual nitroglycerin decreasing the pain to 2/10. He was also given 324 aspirin at that time. Once on the ambulance patient received another sublingual nitroglycerin which completely took away his chest pain. Patient states his pain is similar to when he had his MI in November of 2013 when he had catheterization performed. Denies associated nausea, vomiting, diaphoresis, cough or shortness of breath. At that time he would have benefited from CABG, however according to patient he was considered too high risk for this surgery due to his PMHx. Cardiologist Dr. Patty Sermons.   Patient is a 79 y.o. male presenting with chest pain. The history is provided by the patient and a relative.  Chest Pain Associated symptoms: no cough, no diaphoresis, no dizziness, no fever, no nausea, no shortness of breath, not vomiting and no weakness     Past Medical History  Diagnosis Date  . Diabetes mellitus   . Hyperlipidemia   . Carotid artery occlusion   . Calf DVT (deep venous thrombosis)     a. hx of right calf - chronic coumadin  . PVD (peripheral vascular disease)     hx of  . Coronary artery disease     a. s/p MI & CABG in 2000;  b. 2012 Neg MV;  c. 04/2012 Cath/PTCA: LM 50, LAD 100p, D1 80-55m, LCX 100, RCA 100p,  VG->dRCA 34m, VG->OM 75-80 w/in OM dist to anast, VG-> RI patent w/ 99 in native RI (PTCA only ), LIMA->LAD ok w/ 90-95 LAD after insertion  . Hx pulmonary embolism     a. bilateral;  b. Chronic coumadin  . Stroke   . Ischemic cardiomyopathy     a. 04/2012 Echo: EF 40-45%.    Past Surgical History  Procedure Laterality Date  . Coronary artery bypass graft  2000    4 vessel cabg in [employer], [place].  . Cardiac catheterization    . Carotid endarterectomy  2000    bilateral    Family History  Problem Relation Age of Onset  . Stroke Mother     age 48  . Other Father     pulmonary embolus age 55  . Diabetes Sister   . Stroke Sister   . Diabetes Brother     brother was murdered  . Diabetes Sister     History  Substance Use Topics  . Smoking status: Never Smoker   . Smokeless tobacco: Not on file  . Alcohol Use: No      Review of Systems  Constitutional: Negative for fever, chills and diaphoresis.  Respiratory: Negative for cough, shortness of breath and wheezing.   Cardiovascular: Positive for chest pain. Negative for leg swelling.  Gastrointestinal: Negative for nausea and vomiting.  Skin: Negative for pallor.  Neurological: Negative for dizziness, weakness and light-headedness.  All other systems reviewed and are negative.  Allergies  Doxycycline and Niacin and related  Home Medications   Current Outpatient Rx  Name  Route  Sig  Dispense  Refill  . aspirin 81 MG tablet   Oral   Take 81 mg by mouth daily.         . carvedilol (COREG) 6.25 MG tablet   Oral   Take 1 tablet (6.25 mg total) by mouth 2 (two) times daily with a meal.   60 tablet   6   . diphenhydramine-acetaminophen (TYLENOL PM) 25-500 MG TABS   Oral   Take 2 tablets by mouth at bedtime.         . insulin glargine (LANTUS) 100 UNIT/ML injection   Subcutaneous   Inject 25 Units into the skin every morning.          . isosorbide mononitrate (IMDUR) 60 MG 24 hr tablet   Oral    Take 1 tablet (60 mg total) by mouth daily.   30 tablet   6   . nitroGLYCERIN (NITROSTAT) 0.4 MG SL tablet   Sublingual   Place 1 tablet (0.4 mg total) under the tongue every 5 (five) minutes x 3 doses as needed for chest pain.   25 tablet   3   . Omega-3 Fatty Acids (FISH OIL PO)   Oral   Take 2 capsules by mouth daily.         . pravastatin (PRAVACHOL) 40 MG tablet   Oral   Take 80 mg by mouth daily.          Marland Kitchen warfarin (COUMADIN) 5 MG tablet   Oral   Take 5 mg by mouth every evening. As directed by Dr. Collins Scotland           BP 189/96  Pulse 80  Temp(Src) 98.1 F (36.7 C) (Oral)  Resp 17  SpO2 98%  Physical Exam  Nursing note and vitals reviewed. Constitutional: He is oriented to person, place, and time. He appears well-developed and well-nourished. No distress.  HENT:  Head: Normocephalic and atraumatic.  Mouth/Throat: Oropharynx is clear and moist.  Eyes: Conjunctivae and EOM are normal. Pupils are equal, round, and reactive to light.  Neck: Normal range of motion. Neck supple. No JVD present.  Cardiovascular: Normal rate, regular rhythm, normal heart sounds and intact distal pulses.   No murmur heard. No extremity edema.  Pulmonary/Chest: Effort normal and breath sounds normal. No respiratory distress. He has no wheezes. He has no rales.  Abdominal: Soft. Bowel sounds are normal. There is no tenderness.  Musculoskeletal: Normal range of motion. He exhibits no edema.  Neurological: He is alert and oriented to person, place, and time. He has normal strength. No sensory deficit.  Skin: Skin is warm and dry. He is not diaphoretic. No pallor.  Psychiatric: He has a normal mood and affect. His behavior is normal.    ED Course  Procedures (including critical care time)  Labs Reviewed  CBC - Abnormal; Notable for the following:    MCHC 36.9 (*)    Platelets 133 (*)    All other components within normal limits  COMPREHENSIVE METABOLIC PANEL - Abnormal; Notable  for the following:    Glucose, Bld 150 (*)    Creatinine, Ser 1.36 (*)    GFR calc non Af Amer 48 (*)    GFR calc Af Amer 55 (*)    All other components within normal limits  PROTIME-INR - Abnormal; Notable for the following:    Prothrombin Time 24.1 (*)  INR 2.28 (*)    All other components within normal limits  URINALYSIS, ROUTINE W REFLEX MICROSCOPIC - Abnormal; Notable for the following:    Hgb urine dipstick SMALL (*)    Protein, ur 30 (*)    All other components within normal limits  URINE MICROSCOPIC-ADD ON  POCT I-STAT TROPONIN I    Date: 09/18/2012  Rate: 65  Rhythm: normal sinus rhythm  QRS Axis: normal  Intervals: PR prolonged  ST/T Wave abnormalities: ST depressions laterally minimal  Conduction Disutrbances:first-degree A-V block   Narrative Interpretation: no stemi  Old EKG Reviewed: changes noted ST depressions laterally   No results found.   1. Stable angina       MDM  79 y/o male with significant cardiac history presenting with chest pain similar to his prior MI. ST depressions noted laterally which were not present on prior EKG. Initial troponin negative. Will consult cardiology for admission. Currently chest pain free. Case discussed with Dr. Clarene Duke who agrees with plan of care. 4:34 PM Initial troponin negative. Labs unremarkable. Patient will be admitted to cardiology due to EKG changes and nature of chest pain along with patient's history. Admission accepted by [facility].       Trevor Mace, PA-C 09/18/12 [phone]

## 2012-09-18 NOTE — ED Provider Notes (Signed)
79yo M, c/o left sided chest "pain" that began approx 3 hours PTA. Describes the pain as "my heart pain," radiating into his left shoulder.  States he took ASA and ntg with improvement in symptoms.  VSS, A&O, CTA, RRR, abd soft/NT, neuro non-focal.  EKG with new ST depressions lateral leads, no ST elevations.  Troponin negative.  INR therapeutic. Pt has significant cardiac hx.  Will admit.   Laray Anger, DO 09/18/12 1719

## 2012-09-18 NOTE — Progress Notes (Signed)
CRITICAL VALUE ALERT  Critical value received:  Troponin 1.68   Date of notification:  09/18/12  Time of notification:  2115  Critical value read back:yes  Nurse who received alert:  Jodene Nam RN  MD notified (1st page):  Dr. Shirlee Latch  Time of first page:  2125  Responding MD:  Dr. Shirlee Latch  Time MD responded:  2127  No new orders. Will continue to monitor.

## 2012-09-18 NOTE — Telephone Encounter (Signed)
Spoke with patient his pain is a 7/10 and he has been hurting for 2 hours prior to taking the NTG "hoping it would go away".  His pain decreased to a 5/10 after the first NTG and I advised him to take a second NTG while I went and talked with Dr Excell Seltzer who cathed him in Nov.  He has advised for him to go to the ER via EMS.  I called the patient back and his pain is better after second NTG.  I have given him Dr Randolm Idol recommendations and he is going to proceed to the ER

## 2012-09-18 NOTE — ED Notes (Signed)
IV team notified of need for IV. IV attempted by EMS x2 by hospital staff x3

## 2012-09-18 NOTE — ED Notes (Signed)
CBG 112- no insulin coverage

## 2012-09-19 ENCOUNTER — Encounter (HOSPITAL_COMMUNITY): Payer: Self-pay | Admitting: General Practice

## 2012-09-19 DIAGNOSIS — I214 Non-ST elevation (NSTEMI) myocardial infarction: Secondary | ICD-10-CM

## 2012-09-19 LAB — LIPID PANEL
HDL: 31 mg/dL — ABNORMAL LOW (ref >39)
LDL Cholesterol: 123 mg/dL — ABNORMAL HIGH (ref 0–99)
Total CHOL/HDL Ratio: 6.3 RATIO
Triglycerides: 212 mg/dL — ABNORMAL HIGH (ref <150)
VLDL: 42 mg/dL — ABNORMAL HIGH (ref 0–40)

## 2012-09-19 LAB — BASIC METABOLIC PANEL
BUN: 19 mg/dL (ref 6–23)
Calcium: 8.9 mg/dL (ref 8.4–10.5)
Creatinine, Ser: 1.39 mg/dL — ABNORMAL HIGH (ref 0.50–1.35)
GFR calc Af Amer: 54 mL/min — ABNORMAL LOW (ref >90)

## 2012-09-19 LAB — CBC
Hemoglobin: 14.7 g/dL (ref 13.0–17.0)
MCH: 31.1 pg (ref 26.0–34.0)
Platelets: 134 10*3/uL — ABNORMAL LOW (ref 150–400)
RBC: 4.72 MIL/uL (ref 4.22–5.81)
WBC: 8 10*3/uL (ref 4.0–10.5)

## 2012-09-19 LAB — HEMOGLOBIN A1C: Mean Plasma Glucose: 137 mg/dL — ABNORMAL HIGH (ref <117)

## 2012-09-19 LAB — PROTIME-INR
INR: 2.05 — ABNORMAL HIGH (ref 0.00–1.49)
Prothrombin Time: 22.3 seconds — ABNORMAL HIGH (ref 11.6–15.2)

## 2012-09-19 LAB — GLUCOSE, CAPILLARY
Glucose-Capillary: 126 mg/dL — ABNORMAL HIGH (ref 70–99)
Glucose-Capillary: 124 mg/dL — ABNORMAL HIGH (ref 70–99)
Glucose-Capillary: 116 mg/dL — ABNORMAL HIGH (ref 70–99)

## 2012-09-19 MED ORDER — PHYTONADIONE 1 MG/0.5 ML ORAL SOLUTION
1.0000 mg | Freq: Once | ORAL | Status: AC
  Administered 2012-09-19: 1 mg via ORAL
  Filled 2012-09-19: qty 0.5

## 2012-09-19 NOTE — Progress Notes (Addendum)
Subjective:  No further chest pain since third NTG in ambulance yesterday.  Feels well today. Enzymes rising. Repeat EKG not done yet. INR is still too high for cath today.  Objective:  Vital Signs in the last 24 hours: Temp:  [97.2 F (36.2 C)-98.1 F (36.7 C)] 97.2 F (36.2 C) (04/16 0558) Pulse Rate:  [56-80] 56 (04/16 0558) Resp:  [14-20] 16 (04/15 2003) BP: (101-203)/(55-110) 101/55 mmHg (04/16 0558) SpO2:  [95 %-100 %] 97 % (04/16 0558) Weight:  [223 lb 12.8 oz (101.515 kg)] 223 lb 12.8 oz (101.515 kg) (04/16 0558)  Intake/Output from previous day:   Intake/Output from this shift:    . acetaminophen  1,000 mg Oral QHS  . aspirin EC  81 mg Oral Daily  . carvedilol  6.25 mg Oral BID WC  . heparin  5,000 Units Subcutaneous Q8H  . insulin aspart  0-15 Units Subcutaneous TID WC  . insulin glargine  25 Units Subcutaneous q morning - 10a  . isosorbide mononitrate  60 mg Oral Daily  . omega-3 acid ethyl esters  2 g Oral Daily  . simvastatin  40 mg Oral q1800  . sodium chloride  3 mL Intravenous Q12H      Physical Exam: The patient appears to be in no distress.  Head and neck exam reveals that the pupils are equal and reactive.  The extraocular movements are full.  There is no scleral icterus.  Mouth and pharynx are benign.  No lymphadenopathy.  No carotid bruits.  The jugular venous pressure is normal.  Thyroid is not enlarged or tender.  Chest is clear to percussion and auscultation.  No rales or rhonchi.  Expansion of the chest is symmetrical.  Heart reveals no abnormal lift or heave.  First and second heart sounds are normal.  There is no  gallop rub or click. Grade 1/6 systolic murmur at base.  The abdomen is soft and nontender.  Bowel sounds are normoactive.  There is no hepatosplenomegaly or mass.  There are no abdominal bruits.  Extremities reveal no phlebitis or edema.    Neurologic exam is normal strength and no lateralizing weakness.  No sensory  deficits.  Integument reveals no rash  Lab Results:  Recent Labs  09/18/12 2023 09/19/12 0301  WBC 10.3 8.0  HGB 15.4 14.7  PLT 135* 134*    Recent Labs  09/18/12 1532 09/18/12 2023 09/19/12 0301  NA 138  --  138  K 4.1  --  4.2  CL 103  --  104  CO2 25  --  26  GLUCOSE 150*  --  175*  BUN 18  --  19  CREATININE 1.36* 1.29 1.39*    Recent Labs  09/18/12 2023 09/19/12 0301  TROPONINI 1.68* 3.22*   Hepatic Function Panel  Recent Labs  09/18/12 1532  PROT 6.9  ALBUMIN 3.5  AST 13  ALT 8  ALKPHOS 55  BILITOT 0.8    Recent Labs  09/19/12 0301  CHOL 196   No results found for this basename: PROTIME,  in the last 72 hours  Imaging: Imaging results have been reviewed.  Cardiac Studies: Telemetry shows marked sinus bradycardia. Assessment/Plan:  1. Ischemic heart disease with NSTEMI 2. Ischemic cardiomyopathy 3. Diabetes mellitus 4. History of DVT and pulmonary embolus on chronic warfarin. 5. Hypertension.  Plan: continue to hold warfarin       Will ask interventional colleagues to review last cath/PCI to see if another cath is warranted this time  vs medical therapy. For now continue medical therapy.  LOS: 1 day    Cassell Clement 09/19/2012, 7:40 AM Addendum: I spoke with Dr. [place] who reviewed the last cath films.  He does feel that the patient should be restudied this admission.  The INR is still above 2 today so we will give 1 mg vitamin K by mouth now and plan on cardiac catheterization tomorrow.

## 2012-09-20 ENCOUNTER — Encounter (HOSPITAL_COMMUNITY)
Admission: EM | Disposition: A | Discharge status: Home / Self Care | Payer: Self-pay | Source: Home / Self Care | Attending: Internal Medicine

## 2012-09-20 DIAGNOSIS — I2510 Atherosclerotic heart disease of native coronary artery without angina pectoris: Secondary | ICD-10-CM

## 2012-09-20 HISTORY — PX: LEFT HEART CATHETERIZATION WITH CORONARY/GRAFT ANGIOGRAM: SHX5450

## 2012-09-20 LAB — GLUCOSE, CAPILLARY
Glucose-Capillary: 102 mg/dL — ABNORMAL HIGH (ref 70–99)
Glucose-Capillary: 100 mg/dL — ABNORMAL HIGH (ref 70–99)
Glucose-Capillary: 104 mg/dL — ABNORMAL HIGH (ref 70–99)
Glucose-Capillary: 150 mg/dL — ABNORMAL HIGH (ref 70–99)

## 2012-09-20 LAB — PROTIME-INR
INR: 1.4 (ref 0.00–1.49)
Prothrombin Time: 16.8 seconds — ABNORMAL HIGH (ref 11.6–15.2)

## 2012-09-20 SURGERY — LEFT HEART CATHETERIZATION WITH CORONARY/GRAFT ANGIOGRAM
Anesthesia: LOCAL

## 2012-09-20 MED ORDER — HEPARIN (PORCINE) IN NACL 2-0.9 UNIT/ML-% IJ SOLN
INTRAMUSCULAR | Status: AC
  Filled 2012-09-20: qty 1000

## 2012-09-20 MED ORDER — INSULIN GLARGINE 100 UNIT/ML ~~LOC~~ SOLN
12.0000 [IU] | Freq: Every morning | SUBCUTANEOUS | Status: DC
  Administered 2012-09-20: 12 [IU] via SUBCUTANEOUS
  Filled 2012-09-20 (×2): qty 0.12

## 2012-09-20 MED ORDER — SODIUM CHLORIDE 0.9 % IV SOLN: 250.0000 mL | INTRAVENOUS | Status: DC | PRN

## 2012-09-20 MED ORDER — WARFARIN - PHARMACIST DOSING INPATIENT: Freq: Every day | Status: DC

## 2012-09-20 MED ORDER — MIDAZOLAM HCL 2 MG/2ML IJ SOLN
INTRAMUSCULAR | Status: AC
  Filled 2012-09-20: qty 2

## 2012-09-20 MED ORDER — FENTANYL CITRATE 0.05 MG/ML IJ SOLN
INTRAMUSCULAR | Status: AC
  Filled 2012-09-20: qty 2

## 2012-09-20 MED ORDER — LIDOCAINE HCL (PF) 1 % IJ SOLN
INTRAMUSCULAR | Status: AC
  Filled 2012-09-20: qty 30

## 2012-09-20 MED ORDER — WARFARIN SODIUM 10 MG PO TABS
10.0000 mg | ORAL_TABLET | Freq: Once | ORAL | Status: AC
  Administered 2012-09-20: 10 mg via ORAL
  Filled 2012-09-20: qty 1

## 2012-09-20 MED ORDER — SODIUM CHLORIDE 0.9 % IV SOLN: INTRAVENOUS | Status: AC

## 2012-09-20 MED ORDER — SODIUM CHLORIDE 0.9 % IV SOLN: INTRAVENOUS | Status: DC

## 2012-09-20 MED ORDER — ASPIRIN 81 MG PO CHEW
324.0000 mg | CHEWABLE_TABLET | ORAL | Status: DC
Start: 2012-09-21 — End: 2012-09-20

## 2012-09-20 MED ORDER — SODIUM CHLORIDE 0.9 % IJ SOLN: 3.0000 mL | Freq: Two times a day (BID) | INTRAMUSCULAR | Status: DC

## 2012-09-20 MED ORDER — SODIUM CHLORIDE 0.9 % IJ SOLN: 3.0000 mL | INTRAMUSCULAR | Status: DC | PRN

## 2012-09-20 NOTE — Consult Note (Signed)
ANTICOAGULATION CONSULT NOTE - Initial Consult  Pharmacy Consult for Coumadin Indication: hx DVT/PE  Allergies  Allergen Reactions  . Doxycycline     swelling  . Morphine And Related     NAUSEA    . Niacin And Related Other (See Comments)    Hot flashes    Patient Measurements: Height: 6\' 2"  (188 cm) Weight: 224 lb 4.8 oz (101.742 kg) IBW/kg (Calculated) : 82.2  Vital Signs: Temp: 97.7 F (36.5 C) (04/17 1030) Temp src: Oral (04/17 1030) BP: 169/69 mmHg (04/17 1030) Pulse Rate: 63 (04/17 1030)  Labs:  Recent Labs  09/18/12 1532 09/18/12 2023 09/19/12 0301 09/20/12 0536  HGB 16.1 15.4 14.7  --   HCT 43.6 41.6 40.7  --   PLT 133* 135* 134*  --   LABPROT 24.1*  --  22.3* 16.8*  INR 2.28*  --  2.05* 1.40  CREATININE 1.36* 1.29 1.39*  --   TROPONINI  --  1.68* 3.22*  --     Estimated Creatinine Clearance: 54.9 ml/min (by C-G formula based on Cr of 1.39).   Medical History: Past Medical History  Diagnosis Date  . Diabetes mellitus   . Hyperlipidemia   . Carotid artery occlusion   . Calf DVT (deep venous thrombosis)     a. hx of right calf - chronic coumadin  . PVD (peripheral vascular disease)     hx of  . Coronary artery disease     a. s/p MI & CABG in 2000;  b. 2012 Neg MV;  c. 04/2012 Cath/PTCA: LM 50, LAD 100p, D1 80-86m, LCX 100, RCA 100p, VG->dRCA 66m, VG->OM 75-80 w/in OM dist to anast, VG-> RI patent w/ 99 in native RI (PTCA only ), LIMA->LAD ok w/ 90-95 LAD after insertion  . Hx pulmonary embolism     a. bilateral;  b. Chronic coumadin  . Stroke   . Ischemic cardiomyopathy     a. 04/2012 Echo: EF 40-45%.  . Myocardial infarction   . Anginal pain   . DVT (deep venous thrombosis)   . Hypertension   . Arthritis    Assessment: 81yom on coumadin pta for hx DVT/PE. Admitted with a therapeutic INR but coumadin placed on hold with plans for cath. Last dose 4/14. 1mg  of po vitamin k was given 4/16 to help reverse his INR.   He is now s/p cath and  coumadin is to resume. INR is subtherapeutic as expected at 1.4. He will probably require more coumadin initially to overcome vitamin k resistance.  PTA dose = 5mg  daily (confirmed with patient).  Goal of Therapy:  INR 2-3 Monitor platelets by anticoagulation protocol: Yes   Plan:  1) Coumadin 10mg  x 1 tonight 2) Daily INR  Fredrik Rigger 09/20/2012,12:53 PM

## 2012-09-20 NOTE — Interval H&P Note (Signed)
History and Physical Interval Note:  09/20/2012 9:24 AM  Wesley [name]  has presented today for surgery, with the diagnosis of cp  The various methods of treatment have been discussed with the patient and family. After consideration of risks, benefits and other options for treatment, the patient has consented to  Procedure(s): LEFT HEART CATHETERIZATION WITH CORONARY/GRAFT ANGIOGRAM (N/A) as a surgical intervention .  The patient's history has been reviewed, patient examined, no change in status, stable for surgery.  I have reviewed the patient's chart and labs.  Questions were answered to the patient's satisfaction.     Lorine Bears

## 2012-09-20 NOTE — CV Procedure (Signed)
    Cardiac Catheterization Procedure Note  Name: Wesley [name] MRN: [id number] DOB: 07-21-1931  Procedure: Left Heart Cath, Selective Coronary Angiography, SVG angiography and LIMA angiography.  Indication: Non-ST elevation myocardial infarction.   Medications:  Sedation:  2 mg IV Versed, 25 mcg IV Fentanyl  Contrast:  85 mL Omnipaque  Procedural details: The right groin was prepped, draped, and anesthetized with 1% lidocaine. Using modified Seldinger technique, a 5 French sheath was introduced into the right femoral artery. Standard Judkins catheters were used for coronary angiography. An IM catheter was used to engage the mammary artery. A multipurpose catheter was used to engage the SVG to RCA. SVG to ramus and SVG to OM were engaged with a JR 4 catheter. A pigtail catheter was used to measure left ventricular pressure without ventriculography. Catheter exchanges were performed over a guidewire. There were no immediate procedural complications. The patient was transferred to the post catheterization recovery area for further monitoring.   Procedural Findings:  Hemodynamics: AO:  173/68  mmHg LV:  173/3    mmHg LVEDP: 15  mmHg  Coronary angiography: Coronary dominance: Right   Left Main:  Normal in size with 30% ostial disease.  Left Anterior Descending (LAD):  Normal in size with diffuse 30% disease proximally. The vessel is occluded in the midsegment.  1st diagonal (D1):  Normal in size with diffuse 80% disease proximally which is unchanged.  Circumflex (LCx):  Normal in size and nondominant. The vessel is occluded in the midsegment.  1st obtuse marginal:  Medium in size with diffuse 30% disease.  Right Coronary Artery: Occluded proximally.  SVG to ramus: Patent. The native vessel has 80% disease. This is the site of a recent balloon angioplasty and is unchanged in appearance. The served territory is overall small.   SVG to OM: Patent with no significant disease. However,  the native OM 2 is now occluded and fills via collaterals from the RCA. The graft itself feeds OM 3 distribution via a retrograde fashion.  LIMA to LAD is patent . The native LAD itself is small and diffusely diseased. This is unchanged in appearance  SVG to RCA is patent with 50% mid stenosis which is unchanged in appearance.  Left ventriculography: Was not performed due to chronic kidney disease.  Final Conclusions:   1. Significant three-vessel coronary artery disease with patent grafts. The culprit for non-ST elevation myocardial infarction is likely the occlusion of native OM 2 which is now supplied via right-to-left collaterals. The site of ramus balloon angioplasty done in November is unchanged in appearance. The disease in the SVG to RCA is unchanged. 2. Mildly elevated left ventricular end-diastolic pressure.  Recommendations:  Continue medical therapy. Obtain an echocardiogram if not already done to evaluate LV systolic function.  Lorine Bears MD, [facility] 09/20/2012, 10:17 AM

## 2012-09-20 NOTE — ED Provider Notes (Signed)
Medical screening examination/treatment/procedure(s) were conducted as a shared visit with non-physician practitioner(s) and myself.  I personally evaluated the patient during the encounter. Please see my previous note.  Laray Anger, DO 09/20/12 1451

## 2012-09-20 NOTE — H&P (View-Only) (Signed)
 Subjective:  No further chest pain since admission.  Feels well today. Enzymes rising. Repeat EKG shows increased lateral T wave inversion. INR is down to 1.4 this am after 1 mg of oral vitamin K yesterday.  Objective:  Vital Signs in the last 24 hours: Temp:  [97.7 F (36.5 C)-97.8 F (36.6 C)] 97.8 F (36.6 C) (04/17 0519) Pulse Rate:  [52-60] 59 (04/17 0519) Resp:  [14] 14 (04/16 1346) BP: (110-136)/(61-75) 136/75 mmHg (04/17 0519) SpO2:  [97 %-98 %] 98 % (04/17 0519) Weight:  [224 lb 4.8 oz (101.742 kg)] 224 lb 4.8 oz (101.742 kg) (04/17 0519)  Intake/Output from previous day: 04/16 0701 - 04/17 0700 In: 760 [P.O.:760] Out: -  Intake/Output from this shift:    . acetaminophen  1,000 mg Oral QHS  . [START ON 09/21/2012] aspirin  324 mg Oral Pre-Cath  . aspirin EC  81 mg Oral Daily  . carvedilol  6.25 mg Oral BID WC  . heparin  5,000 Units Subcutaneous Q8H  . insulin aspart  0-15 Units Subcutaneous TID WC  . insulin glargine  25 Units Subcutaneous q morning - 10a  . isosorbide mononitrate  60 mg Oral Daily  . omega-3 acid ethyl esters  2 g Oral Daily  . simvastatin  40 mg Oral q1800  . sodium chloride  3 mL Intravenous Q12H  . sodium chloride  3 mL Intravenous Q12H   . sodium chloride      Physical Exam: The patient appears to be in no distress.  Head and neck exam reveals that the pupils are equal and reactive.  The extraocular movements are full.  There is no scleral icterus.  Mouth and pharynx are benign.  No lymphadenopathy.  No carotid bruits.  The jugular venous pressure is normal.  Thyroid is not enlarged or tender.  Chest is clear to percussion and auscultation.  No rales or rhonchi.  Expansion of the chest is symmetrical.  Heart reveals no abnormal lift or heave.  First and second heart sounds are normal.  There is no  gallop rub or click. Grade 1/6 systolic murmur at base.  The abdomen is soft and nontender.  Bowel sounds are normoactive.  There is no  hepatosplenomegaly or mass.  There are no abdominal bruits.  Extremities reveal no phlebitis or edema.    Neurologic exam is normal strength and no lateralizing weakness.  No sensory deficits.  Integument reveals no rash  Lab Results:  Recent Labs  09/18/12 2023 09/19/12 0301  WBC 10.3 8.0  HGB 15.4 14.7  PLT 135* 134*    Recent Labs  09/18/12 1532 09/18/12 2023 09/19/12 0301  NA 138  --  138  K 4.1  --  4.2  CL 103  --  104  CO2 25  --  26  GLUCOSE 150*  --  175*  BUN 18  --  19  CREATININE 1.36* 1.29 1.39*    Recent Labs  09/18/12 2023 09/19/12 0301  TROPONINI 1.68* 3.22*   Hepatic Function Panel  Recent Labs  09/18/12 1532  PROT 6.9  ALBUMIN 3.5  AST 13  ALT 8  ALKPHOS 55  BILITOT 0.8    Recent Labs  09/19/12 0301  CHOL 196   No results found for this basename: PROTIME,  in the last 72 hours  Imaging: Imaging results have been reviewed.  Cardiac Studies: Telemetry shows marked sinus bradycardia. EKG shows evolving T wave inversion laterally. Assessment/Plan:  1. Ischemic heart disease with NSTEMI   2. Ischemic cardiomyopathy 3. Diabetes mellitus 4. History of DVT and pulmonary embolus on chronic warfarin. 5. Hypertension.  Plan: Cardiac cath today by Dr. Nahser. Anticipate restart warfarin after cath.  LOS: 2 days    Wesley [name] 09/20/2012, 7:23 AM  

## 2012-09-20 NOTE — Progress Notes (Signed)
Subjective:  No further chest pain since admission.  Feels well today. Enzymes rising. Repeat EKG shows increased lateral T wave inversion. INR is down to 1.4 this am after 1 mg of oral vitamin K yesterday.  Objective:  Vital Signs in the last 24 hours: Temp:  [97.7 F (36.5 C)-97.8 F (36.6 C)] 97.8 F (36.6 C) (04/17 0519) Pulse Rate:  [52-60] 59 (04/17 0519) Resp:  [14] 14 (04/16 1346) BP: (110-136)/(61-75) 136/75 mmHg (04/17 0519) SpO2:  [97 %-98 %] 98 % (04/17 0519) Weight:  [224 lb 4.8 oz (101.742 kg)] 224 lb 4.8 oz (101.742 kg) (04/17 0519)  Intake/Output from previous day: 04/16 0701 - 04/17 0700 In: 760 [P.O.:760] Out: -  Intake/Output from this shift:    . acetaminophen  1,000 mg Oral QHS  . [START ON 09/21/2012] aspirin  324 mg Oral Pre-Cath  . aspirin EC  81 mg Oral Daily  . carvedilol  6.25 mg Oral BID WC  . heparin  5,000 Units Subcutaneous Q8H  . insulin aspart  0-15 Units Subcutaneous TID WC  . insulin glargine  25 Units Subcutaneous q morning - 10a  . isosorbide mononitrate  60 mg Oral Daily  . omega-3 acid ethyl esters  2 g Oral Daily  . simvastatin  40 mg Oral q1800  . sodium chloride  3 mL Intravenous Q12H  . sodium chloride  3 mL Intravenous Q12H   . sodium chloride      Physical Exam: The patient appears to be in no distress.  Head and neck exam reveals that the pupils are equal and reactive.  The extraocular movements are full.  There is no scleral icterus.  Mouth and pharynx are benign.  No lymphadenopathy.  No carotid bruits.  The jugular venous pressure is normal.  Thyroid is not enlarged or tender.  Chest is clear to percussion and auscultation.  No rales or rhonchi.  Expansion of the chest is symmetrical.  Heart reveals no abnormal lift or heave.  First and second heart sounds are normal.  There is no  gallop rub or click. Grade 1/6 systolic murmur at base.  The abdomen is soft and nontender.  Bowel sounds are normoactive.  There is no  hepatosplenomegaly or mass.  There are no abdominal bruits.  Extremities reveal no phlebitis or edema.    Neurologic exam is normal strength and no lateralizing weakness.  No sensory deficits.  Integument reveals no rash  Lab Results:  Recent Labs  09/18/12 2023 09/19/12 0301  WBC 10.3 8.0  HGB 15.4 14.7  PLT 135* 134*    Recent Labs  09/18/12 1532 09/18/12 2023 09/19/12 0301  NA 138  --  138  K 4.1  --  4.2  CL 103  --  104  CO2 25  --  26  GLUCOSE 150*  --  175*  BUN 18  --  19  CREATININE 1.36* 1.29 1.39*    Recent Labs  09/18/12 2023 09/19/12 0301  TROPONINI 1.68* 3.22*   Hepatic Function Panel  Recent Labs  09/18/12 1532  PROT 6.9  ALBUMIN 3.5  AST 13  ALT 8  ALKPHOS 55  BILITOT 0.8    Recent Labs  09/19/12 0301  CHOL 196   No results found for this basename: PROTIME,  in the last 72 hours  Imaging: Imaging results have been reviewed.  Cardiac Studies: Telemetry shows marked sinus bradycardia. EKG shows evolving T wave inversion laterally. Assessment/Plan:  1. Ischemic heart disease with NSTEMI  2. Ischemic cardiomyopathy 3. Diabetes mellitus 4. History of DVT and pulmonary embolus on chronic warfarin. 5. Hypertension.  Plan: Cardiac cath today by Dr. Elease Hashimoto. Anticipate restart warfarin after cath.  LOS: 2 days    Cassell Clement 09/20/2012, 7:23 AM

## 2012-09-21 ENCOUNTER — Telehealth: Payer: Self-pay | Admitting: Cardiology

## 2012-09-21 ENCOUNTER — Encounter (HOSPITAL_COMMUNITY): Payer: Self-pay | Admitting: Physician Assistant

## 2012-09-21 ENCOUNTER — Other Ambulatory Visit: Payer: Self-pay | Admitting: Physician Assistant

## 2012-09-21 DIAGNOSIS — I214 Non-ST elevation (NSTEMI) myocardial infarction: Secondary | ICD-10-CM

## 2012-09-21 DIAGNOSIS — E785 Hyperlipidemia, unspecified: Secondary | ICD-10-CM

## 2012-09-21 DIAGNOSIS — R7989 Other specified abnormal findings of blood chemistry: Secondary | ICD-10-CM

## 2012-09-21 DIAGNOSIS — I255 Ischemic cardiomyopathy: Secondary | ICD-10-CM

## 2012-09-21 LAB — PROTIME-INR
INR: 1.23 (ref 0.00–1.49)
Prothrombin Time: 15.3 seconds — ABNORMAL HIGH (ref 11.6–15.2)

## 2012-09-21 MED ORDER — HYDRALAZINE HCL 25 MG PO TABS: 25.0000 mg | ORAL_TABLET | Freq: Three times a day (TID) | ORAL | Status: DC

## 2012-09-21 MED ORDER — ATORVASTATIN CALCIUM 80 MG PO TABS: 80.0000 mg | ORAL_TABLET | Freq: Every day | ORAL | Status: DC

## 2012-09-21 MED ORDER — ATORVASTATIN CALCIUM 40 MG PO TABS: 40.0000 mg | ORAL_TABLET | Freq: Every day | ORAL | Status: DC

## 2012-09-21 NOTE — Telephone Encounter (Signed)
Pts daughter states that pt will be released from hospital later today. She is advised that a nurse from this office will be calling them on Monday for a TCM call. She verbalized understanding.

## 2012-09-21 NOTE — Discharge Summary (Signed)
Discharge Summary   Patient ID: Wesley [name],  MRN: [id number], DOB/AGE: 1932-05-23 79 y.o.  Admit date: 09/18/2012 Discharge date: 09/21/2012  Primary Physician: Herb Grays, MD Primary Cardiologist: Wylene Simmer, MD  Discharge Diagnoses Principal Problem:   NSTEMI (non-ST elevated myocardial infarction) Active Problems:   Coronary artery disease   CKD (chronic kidney disease), stage III   PVD (peripheral vascular disease)   Hx pulmonary embolism   Diabetes mellitus   Hypertension, uncontrolled   Dyslipidemia   Elevated TSH   Allergies Allergies  Allergen Reactions  . Doxycycline     swelling  . Morphine And Related     NAUSEA    . Niacin And Related Other (See Comments)    Hot flashes    Diagnostic Studies/Procedures  PA/LATERAL CHEST X-RAY - 09/18/12  IMPRESSION:  No acute cardiopulmonary disease.  CARDIAC CATHETERIZATION - 09/20/12  Coronary dominance: Right  Left Main: Normal in size with 30% ostial disease.  Left Anterior Descending (LAD): Normal in size with diffuse 30% disease proximally. The vessel is occluded in the midsegment.  1st diagonal (D1): Normal in size with diffuse 80% disease proximally which is unchanged.  Circumflex (LCx): Normal in size and nondominant. The vessel is occluded in the midsegment.  1st obtuse marginal: Medium in size with diffuse 30% disease.  Right Coronary Artery: Occluded proximally.  SVG to ramus: Patent. The native vessel has 80% disease. This is the site of a recent balloon angioplasty and is unchanged in appearance. The served territory is overall small.  SVG to OM: Patent with no significant disease. However, the native OM 2 is now occluded and fills via collaterals from the RCA. The graft itself feeds OM 3 distribution via a retrograde fashion.  LIMA to LAD is patent . The native LAD itself is small and diffusely diseased. This is unchanged in appearance  SVG to RCA is patent with 50% mid stenosis which is unchanged  in appearance. Left ventriculography: Was not performed due to chronic kidney disease.  Final Conclusions:  1. Significant three-vessel coronary artery disease with patent grafts. The culprit for non-ST elevation myocardial infarction is likely the occlusion of native OM 2 which is now supplied via right-to-left collaterals. The site of ramus balloon angioplasty done in November is unchanged in appearance. The disease in the SVG to RCA is unchanged.  2. Mildly elevated left ventricular end-diastolic pressure.  History of Present Illness  Wesley [name] is a 79 y.o. male who was admitted to [facility] on 09/18/12 with the above problem was.  She has a history of CAD s/p CABG in 2000, ischemic cardiomyopathy (EF 40-45%), history of DVT, h/o recurrent PE (on chronic Coumadin anticoagulation), type 2 DM, HTN, dyslipidemia, PVD and CVA.  He had been discharged in early December of last year after undergoing repeat diagnostic cardiac catheterization for recurrent chest discomfort consistent with prior episodes of unstable angina. This revealed patent grafts, severe three-vessel CAD, severe diffuse small vessel disease and residual CAD. He underwent angioplasty alone to a 99% ramus intermedius lesion with resultant dissection and 70% restenosis.  He had been doing well since that time until around noon the day of admission when he began experiencing sudden onset left-sided chest pressure radiating to his left arm numbness and of his discomfort preceding prior episodes of unstable angina. This was rated at 8/10. The pain persisted and he called EMS. Upon arrival he was given 3 total sublingual nitroglycerin tablets with improvement of his discomfort to 2/10.  He  was transported to the ED where EKG revealed new inferior Q waves and subtle anterolateral nonspecific ST changes concerning for interval MI, ischemia since his prior cath. His initial troponin was within normal limits. His INR was therapeutic  at 2.28. Chest x-ray as above revealed no acute pulmonary process.  Given his cardiac history, cardiac risk factors and chest discomfort concerning for unstable angina, the decision was made to observe overnight and confer with Dr. Patty Sermons following morning regarding plan for medical therapy versus repeat cardiac catheterization.  Hospital Course   The patient's Coumadin was held. A subsequent troponin returned mildly elevated at 1.68, in the next resulted at 3.22. He experienced no further chest discomfort. He was seen by Dr. Patty Sermons following day and felt the best course of action was to review his previous cath films with interventionalists to determine a plan. Dr. [place] who reviewed the last cath films felt that the patient should be restudied this admission. His INR remained therapeutic and the plan was made to allow this to trend down prior to undergoing cath. A lipid panel was ordered revealing LDL 123, HDL 31, triglycerides 212, total cholesterol 196. TSH was mildly elevated at 5.318. Hemoglobin A1c 6.4%.  The following day his INR down trended further. He was informed, consented and prepped for cardiac catheterization which was accessed via the right femoral artery. As above, this revealed significant three-vessel CAD with patent grafts. The culprit lesion was identified as an occlusion of the native OM 2 which filled via right-to-left collaterals. The prior sites of the balloon angioplasty done in November was unchanged in appearance. SVG-RCA was unchanged. LVEDP was noted to be mildly elevated. A left ventriculogram was deferred given his chronic renal insufficiency. He tolerated the procedure well without complications. The plan was made to continue with medical therapy.  She remained stable overnight and was evaluated by Dr. Patty Sermons following morning. He deemed him stable for discharge with plans for early followup and repeat echocardiogram in the office. Taking into account the  patient's ischemic cardiomyopathy and chronic renal insufficiency, ACEi/ARB/spironolactone were deferred. The patient was noted to be  hypertensive this admission (SBP 180s), and hydralazine was added to his regimen of isosorbide mononitrate for mortality benefit. He has been advised to monitor his blood pressure with this addition. Also, given LDL greater than 70, NSTEMI this admission and diffuse residual CAD, the patient's home pravastatin was replaced with high-dose atorvastatin. He will need repeat LFTs and lipid panel in approximately 6 weeks given this adjustment. He will resume his prior outpatient Coumadin regimen without bridging. He has been advised to followup with his PCP next week check his INR and for further evaluation of elevated TSH this admission. He has been advised to hydrate over the next 24 hours giving contrast load with his underlying CKD. This information, including post cath instructions and activity restrictions, has been clearly outlined in the discharge AVS.   Discharge Vitals:  Blood pressure 146/69, pulse 62, temperature 98.5 F (36.9 C), temperature source Oral, resp. rate 18, height 6\' 2"  (1.88 m), weight 100.88 kg (222 lb 6.4 oz), SpO2 97.00%.   Weight change: -0.862 kg (-1 lb 14.4 oz)  Labs: Recent Labs     09/18/12  2023  09/19/12  0301  WBC  10.3  8.0  HGB  15.4  14.7  HCT  41.6  40.7  MCV  85.6  86.2  PLT  135*  134*    Recent Labs Lab 09/18/12 1532 09/18/12 2023 09/19/12 0301  NA 138  --  138  K 4.1  --  4.2  CL 103  --  104  CO2 25  --  26  BUN 18  --  19  CREATININE 1.36* 1.29 1.39*  CALCIUM 9.2  --  8.9  PROT 6.9  --   --   BILITOT 0.8  --   --   ALKPHOS 55  --   --   ALT 8  --   --   AST 13  --   --   GLUCOSE 150*  --  175*   Recent Labs     09/18/12  2023  HGBA1C  6.4*   Recent Labs     09/18/12  2023  09/19/12  0301  TROPONINI  1.68*  3.22*   Recent Labs     09/19/12  0301  CHOL  196  HDL  31*  LDLCALC  123*  TRIG   212*  CHOLHDL  6.3    Recent Labs  09/18/12 2023  TSH 5.318*   Disposition:  Discharge Orders   Future Appointments Provider Department Dept Phone   10/04/2012 10:30 AM [facility] SITE 3 ECHO LAB [id number]   10/04/2012 11:50 AM Beatrice Lecher, PA-C [facility] Main Office [place]) [phone]   Future Orders Complete By Expires     Diet - low sodium heart healthy  As directed     Increase activity slowly  As directed           Follow-up Information   Follow up with Tereso Newcomer, PA-C On 10/04/2012. (At 10:30 AM for echocardiogram, appointment 11:50 AM for follow-up appointment. )    Contact information:   [address] [place] [address] [phone]       Follow up with Herb Grays, MD. Schedule an appointment as soon as possible for a visit in 3 days. (Please schedule an appointment for next week to have INR checked and to discuss abnormal thyroid level this admission. )    Contact information:   [address] [place] [address] [phone]       Discharge Medications:    Medication List    STOP taking these medications       pravastatin 40 MG tablet  Commonly known as:  PRAVACHOL      TAKE these medications       aspirin 81 MG tablet  Take 81 mg by mouth daily.     atorvastatin 80 MG tablet  Commonly known as:  LIPITOR  Take 1 tablet (80 mg total) by mouth daily.     carvedilol 6.25 MG tablet  Commonly known as:  COREG  Take 6.25 mg by mouth 2 (two) times daily with a meal.     diphenhydramine-acetaminophen 25-500 MG Tabs  Commonly known as:  TYLENOL PM  Take 2 tablets by mouth at bedtime.     fish oil-omega-3 fatty acids 1000 MG capsule  Take 2 g by mouth daily.     hydrALAZINE 25 MG tablet  Commonly known as:  APRESOLINE  Take 1 tablet (25 mg total) by mouth 3 (three) times daily.     insulin glargine 100 UNIT/ML injection  Commonly known as:   LANTUS  Inject 25 Units into the skin every morning.     isosorbide mononitrate 60 MG 24 hr tablet  Commonly known as:  IMDUR  Take 60 mg by mouth daily.  nitroGLYCERIN 0.4 MG SL tablet  Commonly known as:  NITROSTAT  Place 0.4 mg under the tongue every 5 (five) minutes x 3 doses as needed for chest pain.     warfarin 5 MG tablet  Commonly known as:  COUMADIN  Take 5 mg by mouth every evening. As directed by Dr. Collins [place]       Outstanding Labs/Studies: 2D echo 10/04/12, LFTs and lipid panel in 6 weeks  Duration of Discharge Encounter: Greater than 30 minutes including physician time.  Signed, R. Hurman Horn, PA-C 09/21/2012, 9:38 AM

## 2012-09-21 NOTE — Telephone Encounter (Signed)
New Problem:    I scheduled to patient to have a 14 day TCM appointment with Tereso Newcomer on 10/04/12 at 11:50 a.m.

## 2012-09-21 NOTE — Progress Notes (Signed)
Subjective:  The patient did well with his cardiac catheterization yesterday.  It demonstrated a new occlusion of the obtuse marginal 2.  There are right-to-left collaterals.  Medical therapy.  The previous ramus angioplasty site done in November was unchanged in appearance. The patient has had no chest pain since admission.  His warfarin was restarted last night.  He does not require bridging with Lovenox.  Objective:  Vital Signs in the last 24 hours: Temp:  [97.7 F (36.5 C)-98.5 F (36.9 C)] 98.5 F (36.9 C) (04/18 0539) Pulse Rate:  [59-74] 59 (04/18 0539) Resp:  [17-18] 18 (04/18 0539) BP: (135-169)/(64-69) 135/64 mmHg (04/18 0539) SpO2:  [97 %-98 %] 97 % (04/18 0539) Weight:  [222 lb 6.4 oz (100.88 kg)] 222 lb 6.4 oz (100.88 kg) (04/18 0539)  Intake/Output from previous day: 04/17 0701 - 04/18 0700 In: 360 [P.O.:360] Out: -  Intake/Output from this shift:    . aspirin EC  81 mg Oral Daily  . carvedilol  6.25 mg Oral BID WC  . insulin aspart  0-15 Units Subcutaneous TID WC  . insulin glargine  12 Units Subcutaneous q morning - 10a  . isosorbide mononitrate  60 mg Oral Daily  . omega-3 acid ethyl esters  2 g Oral Daily  . simvastatin  40 mg Oral q1800  . sodium chloride  3 mL Intravenous Q12H  . Warfarin - Pharmacist Dosing Inpatient   Does not apply q1800      Physical Exam: The patient appears to be in no distress.  Head and neck exam reveals that the pupils are equal and reactive.  The extraocular movements are full.  There is no scleral icterus.  Mouth and pharynx are benign.  No lymphadenopathy.  No carotid bruits.  The jugular venous pressure is normal.  Thyroid is not enlarged or tender.  Chest is clear to percussion and auscultation.  No rales or rhonchi.  Expansion of the chest is symmetrical.  Heart reveals no abnormal lift or heave.  First and second heart sounds are normal.  There is no  gallop rub or click. Grade 1/6 systolic murmur at base.  The  abdomen is soft and nontender.  Bowel sounds are normoactive.  There is no hepatosplenomegaly or mass.  There are no abdominal bruits.  The groin reveals minor degree of bruising.  No evidence of bruit or pseudoaneurysm.  Extremities reveal no phlebitis or edema.  Pedal pulses are present.  Neurologic exam is normal strength and no lateralizing weakness.  No sensory deficits.  Integument reveals no rash  Lab Results:  Recent Labs  09/18/12 2023 09/19/12 0301  WBC 10.3 8.0  HGB 15.4 14.7  PLT 135* 134*    Recent Labs  09/18/12 1532 09/18/12 2023 09/19/12 0301  NA 138  --  138  K 4.1  --  4.2  CL 103  --  104  CO2 25  --  26  GLUCOSE 150*  --  175*  BUN 18  --  19  CREATININE 1.36* 1.29 1.39*    Recent Labs  09/18/12 2023 09/19/12 0301  TROPONINI 1.68* 3.22*   Hepatic Function Panel  Recent Labs  09/18/12 1532  PROT 6.9  ALBUMIN 3.5  AST 13  ALT 8  ALKPHOS 55  BILITOT 0.8    Recent Labs  09/19/12 0301  CHOL 196   No results found for this basename: PROTIME,  in the last 72 hours  Imaging: Imaging results have been reviewed.  Cardiac Studies:  Telemetry shows marked sinus bradycardia. EKG shows evolving T wave inversion laterally. Assessment/Plan:  1. Ischemic heart disease with NSTEMI secondary to occlusion of obtuse marginal 2 2. Ischemic cardiomyopathy 3. Diabetes mellitus 4. History of DVT and remote pulmonary embolus on chronic warfarin. 5. Hypertension.  Plan: Okay for discharge home today.  Continue home dose of warfarin.  Get a recheck of his INR next week in Dr. Kerri Perches office.  See Dr. Patty Sermons in 2-3 weeks for followup office visit and EKG at that visit we will also arrange for a subsequent outpatient echocardiogram.  LOS: 3 days    Wesley [name] 09/21/2012, 8:34 AM

## 2012-09-24 ENCOUNTER — Encounter: Payer: Self-pay | Admitting: *Deleted

## 2012-09-24 NOTE — Telephone Encounter (Signed)
**  TCM** Spoke with patient who states he is doing well.  Patient denies c/o chest discomfort or SOB.  Patient states he is taking all of his medications as directed; states he is experiencing some nausea with his medications even though he is taking them with meals.  I encouraged patient that this may get better over time as some of his meds are still new and that if the symptoms do not resolve that he can eat a few crackers with his meds to see if this helps.  Patient aware of appointment for ECHO and f/u on 5/1.

## 2012-09-25 ENCOUNTER — Telehealth: Payer: Self-pay

## 2012-09-25 NOTE — Telephone Encounter (Signed)
TCM call to patient he stated he is feeling good,understands discharge instructions and is taking medication as prescribed.Advised to keep appointment for echo and post hospital appointment with Tereso Newcomer PA 10/04/12.

## 2012-10-04 ENCOUNTER — Ambulatory Visit (INDEPENDENT_AMBULATORY_CARE_PROVIDER_SITE_OTHER): Payer: Medicare Other | Admitting: Physician Assistant

## 2012-10-04 ENCOUNTER — Ambulatory Visit (HOSPITAL_COMMUNITY): Payer: Medicare Other | Attending: Physician Assistant | Admitting: Radiology

## 2012-10-04 ENCOUNTER — Encounter: Payer: Self-pay | Admitting: Physician Assistant

## 2012-10-04 VITALS — BP 160/78 | HR 59 | Ht 74.0 in | Wt 227.4 lb

## 2012-10-04 DIAGNOSIS — I255 Ischemic cardiomyopathy: Secondary | ICD-10-CM

## 2012-10-04 DIAGNOSIS — E785 Hyperlipidemia, unspecified: Secondary | ICD-10-CM

## 2012-10-04 DIAGNOSIS — I2510 Atherosclerotic heart disease of native coronary artery without angina pectoris: Secondary | ICD-10-CM | POA: Insufficient documentation

## 2012-10-04 DIAGNOSIS — I2699 Other pulmonary embolism without acute cor pulmonale: Secondary | ICD-10-CM

## 2012-10-04 DIAGNOSIS — I10 Essential (primary) hypertension: Secondary | ICD-10-CM | POA: Insufficient documentation

## 2012-10-04 DIAGNOSIS — E119 Type 2 diabetes mellitus without complications: Secondary | ICD-10-CM | POA: Insufficient documentation

## 2012-10-04 DIAGNOSIS — I2589 Other forms of chronic ischemic heart disease: Secondary | ICD-10-CM | POA: Insufficient documentation

## 2012-10-04 DIAGNOSIS — I509 Heart failure, unspecified: Secondary | ICD-10-CM | POA: Insufficient documentation

## 2012-10-04 DIAGNOSIS — I252 Old myocardial infarction: Secondary | ICD-10-CM | POA: Insufficient documentation

## 2012-10-04 DIAGNOSIS — Z86711 Personal history of pulmonary embolism: Secondary | ICD-10-CM | POA: Insufficient documentation

## 2012-10-04 NOTE — Progress Notes (Signed)
Echocardiogram performed.  

## 2012-10-04 NOTE — Progress Notes (Signed)
[address], [address] [place], [place]  [address] Phone: [phone] Fax:  [phone]  Date:  10/04/2012   ID:  Wesley [name], DOB 01-Jan-1932, MRN [id number]  PCP:  Wesley Grays, MD  Primary Cardiologist:  Dr. Cassell [name]     History of Present Illness: Wesley [name] is a 79 y.o. male who returns for follow up after recent admission to the hospital 4/15-4/18 with a non-STEMI. He has a hx of CAD, status post MI followed by CABG in 2000, angioplasty to the native ramus intermediate 04/2012, ischemic cardiomyopathy, DM2, HL, CKD, carotid stenosis, prior stroke, HTN, PAD, prior DVT/bilateral pulmonary emboli. He is on chronic Coumadin. Patient was admitted in 04/2012 with unstable angina.  He had an angioplasty of the ramus intermediate with resultant dissection and 70% restenosis. Echo 04/2012:  EF 40-45%.  He was readmitted 4/15 with complaints of sudden onset left-sided chest pressure.  LHC 09/20/12:  oLM 30%, mid LAD occluded, prox D1 80%, mid CFX occluded, OM1 30%, proximal RCA occluded, SVG-ramus patent, native ramus 80% (stable from previous study), SVG-OM patent, native OM2 occluded (filled via right collaterals), LIMA-LAD patent, SVG-RCA mid 50%. Culprit for non-STEMI was a newly occluded OM2 with R->L collaterals. Otherwise anatomy was stable and medical therapy was recommended.  He is not on ACE due to CKD.  He has been placed on hydralazine and nitrates.  His Pravastatin was changed to atorvastatin.  F/u echo was arranged for today.  The report is in the system and demonstrates recovered LVF (EF 60-65%).  He is doing well since d/c.  No further chest pain.  Has poor balance and decreased energy since his prior stroke.  Denies significant dyspnea.  No orthopnea, PND, edema.  No syncope.   Labs (4/14):  K 4.2, Cr 1.39, ALT 8, LDL 123, Hgb 14.7, TSH 5.318  Wt Readings from Last 3 Encounters:  10/04/12 227 lb 6.4 oz (103.148 kg)  09/21/12 222 lb 6.4 oz (100.88 kg)  09/21/12  222 lb 6.4 oz (100.88 kg)     Past Medical History  Diagnosis Date  . Diabetes mellitus   . Hyperlipidemia   . Carotid artery occlusion   . Calf DVT (deep venous thrombosis)     a. hx of right calf - chronic coumadin  . PVD (peripheral vascular disease)     hx of  . Coronary artery disease     a. s/p MI & CABG in 2000;  b. 2012 Neg MV;  c. 04/2012 Cath/PTCA:  VG-> RI patent w/ 99 in native RI (PTCA only );  d.  LHC 09/20/12:  oLM 30%, mid LAD occluded, prox D1 80%, mid CFX occluded, OM1 30%, proximal RCA occluded, S-RI, native RI 80% (stable from previous study), S-OM patent, native OM2 occluded (filled via R collats) - culprit, L-LAD patent, S-RCA mid 50%. - med rx  . Hx pulmonary embolism     a. bilateral;  b. Chronic coumadin  . Stroke   . Ischemic cardiomyopathy     a. 04/2012 Echo: EF 40-45%.;  b.  Echo 5/14: EF 60-65%, NL WM, Gr 1 DD  . DVT (deep venous thrombosis)   . Hypertension   . Arthritis   . CKD (chronic kidney disease)     Current Outpatient Prescriptions  Medication Sig Dispense Refill  . aspirin 81 MG tablet Take 81 mg by mouth daily.      . carvedilol (COREG) 6.25 MG tablet Take 6.25 mg by mouth 2 (two) times daily with  a meal.      . diphenhydramine-acetaminophen (TYLENOL PM) 25-500 MG TABS Take 2 tablets by mouth at bedtime.      . fish oil-omega-3 fatty acids 1000 MG capsule Take 2 g by mouth daily.      . insulin glargine (LANTUS) 100 UNIT/ML injection Inject 25 Units into the skin every morning.       . isosorbide mononitrate (IMDUR) 60 MG 24 hr tablet Take 60 mg by mouth daily.      . nitroGLYCERIN (NITROSTAT) 0.4 MG SL tablet Place 0.4 mg under the tongue every 5 (five) minutes x 3 doses as needed for chest pain.      Marland Kitchen warfarin (COUMADIN) 5 MG tablet Take 5 mg by mouth every evening. As directed by Dr. Collins [name]       No current facility-administered medications for this visit.    Allergies:    Allergies  Allergen Reactions  . Doxycycline     swelling    . Morphine And Related     NAUSEA    . Niacin And Related Other (See Comments)    Hot flashes    Social History:  The patient  reports that he has never smoked. He has never used smokeless tobacco. He reports that he does not drink alcohol or use illicit drugs.   ROS:  Please see the history of present illness.   All other systems reviewed and negative.   PHYSICAL EXAM: VS:  BP 160/78  Pulse 59  Ht 6\' 2"  (1.88 m)  Wt 227 lb 6.4 oz (103.148 kg)  BMI 29.18 kg/m2 Well nourished, well developed, in no acute distress HEENT: normal Neck: no JVD at 90 Cardiac:  normal S1, S2; RRR; no murmur Lungs:  clear to auscultation bilaterally, no wheezing, rhonchi or rales Abd: soft, nontender, no hepatomegaly Ext: no edema; right groin without hematoma or bruit, there is a mild amount of ecchymosis  Skin: warm and dry Neuro:  CNs 2-12 intact, no focal abnormalities noted  EKG:  NSR, HR 60, inf Q waves, inf-lat TWI     ASSESSMENT AND PLAN:  1. CAD:  Doing well after recent NSTEMI.  Med Rx recommended.  Continue ASA and statin. 2. Hypertension:  BP elevated.  He has not had any meds yet today.  Continue current Rx. 3. Hyperlipidemia:  Continue Atorvastatin 80.  Check Lipids and LFTs in 6 weeks.   4. Ischemic CM:  EF recovered by recent echo.  Continue BB, hydralazine, nitrates. Continue current Rx. 5. Prior Pulmonary Emboli:  Coumadin managed by primary care.  6. CKD:  Creatinine stable during recent hospital stay. 7. Elevated TSH:  F/u with primary care.  8. Disposition:  F/u with Dr. Cassell [name] in 6 weeks.   Signed, Wesley Newcomer, PA-C  12:10 PM 10/04/2012

## 2012-10-04 NOTE — Patient Instructions (Addendum)
Your physician recommends that you continue on your current medications as directed. Please refer to the Current Medication list given to you today.   Your physician recommends that you schedule a follow-up appointment in: 6 weeks with Dr. Patty Sermons   Your physician recommends that you return for a FASTING lipid profile/lfts in 6 weeks the am of your appointment with Dr. Patty Sermons

## 2012-10-15 ENCOUNTER — Other Ambulatory Visit: Payer: Self-pay | Admitting: *Deleted

## 2012-10-15 DIAGNOSIS — E7800 Pure hypercholesterolemia, unspecified: Secondary | ICD-10-CM

## 2012-10-21 DIAGNOSIS — I10 Essential (primary) hypertension: Secondary | ICD-10-CM

## 2012-10-21 DIAGNOSIS — E785 Hyperlipidemia, unspecified: Secondary | ICD-10-CM

## 2012-10-21 DIAGNOSIS — I2699 Other pulmonary embolism without acute cor pulmonale: Secondary | ICD-10-CM

## 2012-10-21 DIAGNOSIS — I2589 Other forms of chronic ischemic heart disease: Secondary | ICD-10-CM

## 2012-11-13 ENCOUNTER — Ambulatory Visit: Payer: Medicare Other | Admitting: Cardiology

## 2012-11-13 ENCOUNTER — Ambulatory Visit (INDEPENDENT_AMBULATORY_CARE_PROVIDER_SITE_OTHER): Payer: Medicare Other | Admitting: Cardiology

## 2012-11-13 ENCOUNTER — Encounter: Payer: Self-pay | Admitting: Cardiology

## 2012-11-13 ENCOUNTER — Other Ambulatory Visit (INDEPENDENT_AMBULATORY_CARE_PROVIDER_SITE_OTHER): Payer: Medicare Other

## 2012-11-13 VITALS — BP 118/62 | HR 60 | Ht 74.0 in | Wt 223.0 lb

## 2012-11-13 DIAGNOSIS — E7800 Pure hypercholesterolemia, unspecified: Secondary | ICD-10-CM

## 2012-11-13 DIAGNOSIS — I10 Essential (primary) hypertension: Secondary | ICD-10-CM

## 2012-11-13 DIAGNOSIS — I259 Chronic ischemic heart disease, unspecified: Secondary | ICD-10-CM

## 2012-11-13 DIAGNOSIS — I214 Non-ST elevation (NSTEMI) myocardial infarction: Secondary | ICD-10-CM

## 2012-11-13 DIAGNOSIS — I824Z9 Acute embolism and thrombosis of unspecified deep veins of unspecified distal lower extremity: Secondary | ICD-10-CM

## 2012-11-13 LAB — LIPID PANEL
Cholesterol: 136 mg/dL (ref 0–200)
HDL: 28.4 mg/dL — ABNORMAL LOW (ref >39.00)
Triglycerides: 218 mg/dL — ABNORMAL HIGH (ref 0.0–149.0)
VLDL: 43.6 mg/dL — ABNORMAL HIGH (ref 0.0–40.0)

## 2012-11-13 LAB — BASIC METABOLIC PANEL
Chloride: 107 mEq/L (ref 96–112)
Creatinine, Ser: 1.5 mg/dL (ref 0.4–1.5)
Potassium: 4.7 mEq/L (ref 3.5–5.1)
Sodium: 141 mEq/L (ref 135–145)

## 2012-11-13 LAB — HEPATIC FUNCTION PANEL
Albumin: 3.4 g/dL — ABNORMAL LOW (ref 3.5–5.2)
Alkaline Phosphatase: 58 U/L (ref 39–117)
Total Protein: 6.6 g/dL (ref 6.0–8.3)

## 2012-11-13 LAB — LDL CHOLESTEROL, DIRECT: Direct LDL: 73.7 mg/dL

## 2012-11-13 NOTE — Patient Instructions (Signed)
Will obtain labs today and call you with the results (lp/bmet/hfp)  Your physician wants you to follow-up in: 4 month ov and EKG You will receive a reminder letter in the mail two months in advance. If you don't receive a letter, please call our office to schedule the follow-up appointment.   DECREASE YOUR HYDRALAZINE TO 25 MG TWICE A DAY

## 2012-11-13 NOTE — Progress Notes (Signed)
Wesley [name] Date of Birth:  1932/03/05 [facility] [address] [place], [place]  [address] [phone]         Fax   [phone]  History of Present Illness: Wesley [name] is a 79 y.o. male who returns for follow up after recent admission to the hospital 4/15-4/18 with a non-STEMI. He has a hx of CAD, status post MI followed by CABG in 2000, angioplasty to the native ramus intermediate 04/2012, ischemic cardiomyopathy, DM2, HL, CKD, carotid stenosis, prior stroke, HTN, PAD, prior DVT/bilateral pulmonary emboli. He is on chronic Coumadin. Patient was admitted in 04/2012 with unstable angina. He had an angioplasty of the ramus intermediate with resultant dissection and 70% restenosis. Echo 04/2012: EF 40-45%. He was readmitted 4/15 with complaints of sudden onset left-sided chest pressure. LHC 09/20/12: oLM 30%, mid LAD occluded, prox D1 80%, mid CFX occluded, OM1 30%, proximal RCA occluded, SVG-ramus patent, native ramus 80% (stable from previous study), SVG-OM patent, native OM2 occluded (filled via right collaterals), LIMA-LAD patent, SVG-RCA mid 50%. Culprit for non-STEMI was a newly occluded OM2 with R->L collaterals. Otherwise anatomy was stable and medical therapy was recommended. He is not on ACE due to CKD. He has been placed on hydralazine and nitrates. His Pravastatin was changed to atorvastatin. F/u echo  demonstrates recovered LVF (EF 60-65%). He is doing well since d/c. No further chest pain. Has poor balance and decreased energy since his prior stroke. Denies significant dyspnea. No orthopnea, PND, edema. No syncope.    Current Outpatient Prescriptions  Medication Sig Dispense Refill  . aspirin 81 MG tablet Take 81 mg by mouth daily.      Marland Kitchen atorvastatin (LIPITOR) 80 MG tablet Take 80 mg by mouth daily.      . carvedilol (COREG) 6.25 MG tablet Take 6.25 mg by mouth 2 (two) times daily with a meal.      . diphenhydramine-acetaminophen (TYLENOL PM) 25-500 MG TABS  Take 2 tablets by mouth at bedtime.      . fish oil-omega-3 fatty acids 1000 MG capsule Take 2 g by mouth daily.      . hydrALAZINE (APRESOLINE) 25 MG tablet Take 25 mg by mouth 2 (two) times daily.       . insulin glargine (LANTUS) 100 UNIT/ML injection Inject 25 Units into the skin every morning.       . isosorbide mononitrate (IMDUR) 60 MG 24 hr tablet Take 60 mg by mouth daily.      . nitroGLYCERIN (NITROSTAT) 0.4 MG SL tablet Place 0.4 mg under the tongue every 5 (five) minutes x 3 doses as needed for chest pain.      Marland Kitchen warfarin (COUMADIN) 5 MG tablet Take 5 mg by mouth every evening. As directed by Dr. Collins Scotland       No current facility-administered medications for this visit.    Allergies  Allergen Reactions  . Doxycycline     swelling  . Morphine And Related     NAUSEA    . Niacin And Related Other (See Comments)    Hot flashes    Patient Active Problem List   Diagnosis Date Noted  . NSTEMI (non-ST elevated myocardial infarction) 09/21/2012  . Dyslipidemia 09/21/2012  . Elevated TSH 09/21/2012  . Angina pectoris, unstable 04/29/2012  . Diabetes mellitus 04/29/2012  . History of CVA (cerebrovascular accident) 04/29/2012  . CKD (chronic kidney disease), stage III 04/29/2012  . Hypertension, uncontrolled 04/29/2012  . Kidney stone 02/28/2011  . Calf DVT (deep  venous thrombosis)   . PVD (peripheral vascular disease)   . Coronary artery disease   . Hx pulmonary embolism     History  Smoking status  . Never Smoker   Smokeless tobacco  . Never Used    History  Alcohol Use No    Family History  Problem Relation Age of Onset  . Stroke Mother     age 7  . Other Father     pulmonary embolus age 75  . Diabetes Sister   . Stroke Sister   . Diabetes Brother     brother was murdered  . Diabetes Sister     Review of Systems: Constitutional: no fever chills diaphoresis or fatigue or change in weight.  Head and neck: no hearing loss, no epistaxis, no photophobia or  visual disturbance. Respiratory: No cough, shortness of breath or wheezing. Cardiovascular: No chest pain peripheral edema, palpitations. Gastrointestinal: No abdominal distention, no abdominal pain, no change in bowel habits hematochezia or melena. Genitourinary: No dysuria, no frequency, no urgency, no nocturia. Musculoskeletal:No arthralgias, no back pain, no gait disturbance or myalgias. Neurological: No dizziness, no headaches, no numbness, no seizures, no syncope, no weakness, no tremors. Hematologic: No lymphadenopathy, no easy bruising. Psychiatric: No confusion, no hallucinations, no sleep disturbance.    Physical Exam: Filed Vitals:   11/13/12 0826  BP: 118/62  Pulse: 60   the general appearance reveals a well-developed well-nourished gentleman in no distress.The head and neck exam reveals pupils equal and reactive.  Extraocular movements are full.  There is no scleral icterus.  The mouth and pharynx are normal.  The neck is supple.  The carotids reveal no bruits.  The jugular venous pressure is normal.  The  thyroid is not enlarged.  There is no lymphadenopathy.  The chest is clear to percussion and auscultation.  There are no rales or rhonchi.  Expansion of the chest is symmetrical.  The precordium is quiet.  The first heart sound is normal.  The second heart sound is physiologically split.  There is no murmur gallop rub or click.  There is no abnormal lift or heave.  The abdomen is soft and nontender.  The bowel sounds are normal.  The liver and spleen are not enlarged.  There are no abdominal masses.  There are no abdominal bruits.  Extremities reveal good pedal pulses.  There is no phlebitis or edema.  There is no cyanosis or clubbing.  Strength is normal and symmetrical in all extremities.  There is no lateralizing weakness.  There are no sensory deficits.  The skin is warm and dry.  There is no rash.     Assessment / Plan: Overall the patient is doing well.  We will see if  slight reduction in hydralazine helps his energy level. Recheck in 4 months for followup office visit and EKG.  Blood work today is pending.

## 2012-11-13 NOTE — Assessment & Plan Note (Signed)
The patient is on chronic Coumadin because of recurrent problems with deep vein thrombosis.  He has not had any recent symptoms of DVT.  He is tolerating anticoagulation without any signs or symptoms of bleeding.  His INR is monitored by Dr. Collins Scotland.

## 2012-11-13 NOTE — Assessment & Plan Note (Signed)
The patient has not had any recurrent chest pain or angina.  He is on 81 mg aspirin daily as well as warfarin

## 2012-11-13 NOTE — Assessment & Plan Note (Signed)
Blood pressure has been well controlled on current medication.  Patient complains that he feels tired all the time and attributes some of this to being on so much medicine.  He feels that the hydralazine is making him tired.  We will try reducing hydralazine to twice a day and see if it makes a difference.

## 2012-11-14 ENCOUNTER — Other Ambulatory Visit (HOSPITAL_COMMUNITY): Payer: Self-pay | Admitting: Nurse Practitioner

## 2012-11-14 NOTE — Progress Notes (Signed)
Quick Note:  Preliminary report reviewed by triage nurse and sent to MD desk./PE   ______ 

## 2012-11-14 NOTE — Progress Notes (Signed)
Quick Note:  Please report to patient. The recent labs are stable. Continue same medication and careful diet. ______ 

## 2012-11-15 ENCOUNTER — Telehealth: Payer: Self-pay | Admitting: *Deleted

## 2012-11-15 NOTE — Telephone Encounter (Signed)
Advised patient of lab results  

## 2012-11-15 NOTE — Telephone Encounter (Signed)
Message copied by Burnell Blanks on Thu Nov 15, 2012 11:28 AM ------      Message from: Cassell Clement      Created: Wed Nov 14, 2012  9:50 PM       Please report to patient.  The recent labs are stable. Continue same medication and careful diet. ------

## 2012-11-19 ENCOUNTER — Emergency Department (HOSPITAL_COMMUNITY): Payer: Medicare Other

## 2012-11-19 ENCOUNTER — Inpatient Hospital Stay (HOSPITAL_COMMUNITY)
Admission: EM | Admit: 2012-11-19 | Discharge: 2012-11-21 | DRG: 282 | Disposition: A | Discharge status: Home / Self Care | Payer: Medicare Other | Attending: Cardiology | Admitting: Cardiology

## 2012-11-19 ENCOUNTER — Encounter (HOSPITAL_COMMUNITY): Payer: Self-pay | Admitting: Emergency Medicine

## 2012-11-19 DIAGNOSIS — I200 Unstable angina: Secondary | ICD-10-CM

## 2012-11-19 DIAGNOSIS — I824Z9 Acute embolism and thrombosis of unspecified deep veins of unspecified distal lower extremity: Secondary | ICD-10-CM | POA: Diagnosis present

## 2012-11-19 DIAGNOSIS — Z8673 Personal history of transient ischemic attack (TIA), and cerebral infarction without residual deficits: Secondary | ICD-10-CM

## 2012-11-19 DIAGNOSIS — N183 Chronic kidney disease, stage 3 unspecified: Secondary | ICD-10-CM | POA: Diagnosis present

## 2012-11-19 DIAGNOSIS — I2510 Atherosclerotic heart disease of native coronary artery without angina pectoris: Secondary | ICD-10-CM | POA: Diagnosis present

## 2012-11-19 DIAGNOSIS — I252 Old myocardial infarction: Secondary | ICD-10-CM

## 2012-11-19 DIAGNOSIS — I6529 Occlusion and stenosis of unspecified carotid artery: Secondary | ICD-10-CM | POA: Diagnosis present

## 2012-11-19 DIAGNOSIS — Z951 Presence of aortocoronary bypass graft: Secondary | ICD-10-CM

## 2012-11-19 DIAGNOSIS — I129 Hypertensive chronic kidney disease with stage 1 through stage 4 chronic kidney disease, or unspecified chronic kidney disease: Secondary | ICD-10-CM | POA: Diagnosis present

## 2012-11-19 DIAGNOSIS — Z86718 Personal history of other venous thrombosis and embolism: Secondary | ICD-10-CM

## 2012-11-19 DIAGNOSIS — Z86711 Personal history of pulmonary embolism: Secondary | ICD-10-CM | POA: Diagnosis present

## 2012-11-19 DIAGNOSIS — I214 Non-ST elevation (NSTEMI) myocardial infarction: Principal | ICD-10-CM | POA: Diagnosis present

## 2012-11-19 DIAGNOSIS — E785 Hyperlipidemia, unspecified: Secondary | ICD-10-CM | POA: Diagnosis present

## 2012-11-19 DIAGNOSIS — I10 Essential (primary) hypertension: Secondary | ICD-10-CM | POA: Diagnosis present

## 2012-11-19 DIAGNOSIS — E119 Type 2 diabetes mellitus without complications: Secondary | ICD-10-CM | POA: Diagnosis present

## 2012-11-19 DIAGNOSIS — Z7901 Long term (current) use of anticoagulants: Secondary | ICD-10-CM

## 2012-11-19 DIAGNOSIS — I2589 Other forms of chronic ischemic heart disease: Secondary | ICD-10-CM | POA: Diagnosis present

## 2012-11-19 DIAGNOSIS — M129 Arthropathy, unspecified: Secondary | ICD-10-CM | POA: Diagnosis present

## 2012-11-19 DIAGNOSIS — I739 Peripheral vascular disease, unspecified: Secondary | ICD-10-CM | POA: Diagnosis present

## 2012-11-19 LAB — POCT I-STAT TROPONIN I

## 2012-11-19 LAB — BASIC METABOLIC PANEL
CO2: 28 mEq/L (ref 19–32)
Chloride: 105 mEq/L (ref 96–112)
Creatinine, Ser: 1.44 mg/dL — ABNORMAL HIGH (ref 0.50–1.35)
Glucose, Bld: 113 mg/dL — ABNORMAL HIGH (ref 70–99)
Sodium: 142 mEq/L (ref 135–145)

## 2012-11-19 LAB — TROPONIN I
Troponin I: 0.86 ng/mL (ref <0.30)
Troponin I: 0.88 ng/mL (ref <0.30)

## 2012-11-19 LAB — POCT I-STAT, CHEM 8
HCT: 42 % (ref 39.0–52.0)
Hemoglobin: 14.3 g/dL (ref 13.0–17.0)
Potassium: 4.3 mEq/L (ref 3.5–5.1)
Sodium: 141 mEq/L (ref 135–145)

## 2012-11-19 LAB — CBC
HCT: 40.7 % (ref 39.0–52.0)
MCH: 31 pg (ref 26.0–34.0)
MCV: 88.3 fL (ref 78.0–100.0)
RBC: 4.61 MIL/uL (ref 4.22–5.81)
WBC: 8.3 10*3/uL (ref 4.0–10.5)

## 2012-11-19 LAB — GLUCOSE, CAPILLARY
Glucose-Capillary: 101 mg/dL — ABNORMAL HIGH (ref 70–99)
Glucose-Capillary: 112 mg/dL — ABNORMAL HIGH (ref 70–99)
Glucose-Capillary: 131 mg/dL — ABNORMAL HIGH (ref 70–99)
Glucose-Capillary: 131 mg/dL — ABNORMAL HIGH (ref 70–99)

## 2012-11-19 LAB — CBC WITH DIFFERENTIAL/PLATELET
Basophils Absolute: 0.1 10*3/uL (ref 0.0–0.1)
HCT: 41.4 % (ref 39.0–52.0)
Lymphocytes Relative: 32 % (ref 12–46)
Monocytes Absolute: 0.8 10*3/uL (ref 0.1–1.0)
Neutro Abs: 4.3 10*3/uL (ref 1.7–7.7)
Platelets: 141 10*3/uL — ABNORMAL LOW (ref 150–400)
RBC: 4.62 MIL/uL (ref 4.22–5.81)
RDW: 13.6 % (ref 11.5–15.5)
WBC: 8 10*3/uL (ref 4.0–10.5)

## 2012-11-19 LAB — PROTIME-INR: Prothrombin Time: 26.8 seconds — ABNORMAL HIGH (ref 11.6–15.2)

## 2012-11-19 LAB — PRO B NATRIURETIC PEPTIDE: Pro B Natriuretic peptide (BNP): 1459 pg/mL — ABNORMAL HIGH (ref 0–450)

## 2012-11-19 MED ORDER — CARVEDILOL 6.25 MG PO TABS
6.2500 mg | ORAL_TABLET | Freq: Two times a day (BID) | ORAL | Status: DC
  Administered 2012-11-19 – 2012-11-21 (×5): 6.25 mg via ORAL
  Filled 2012-11-19 (×7): qty 1

## 2012-11-19 MED ORDER — INSULIN GLARGINE 100 UNIT/ML ~~LOC~~ SOLN
10.0000 [IU] | Freq: Every morning | SUBCUTANEOUS | Status: DC
  Administered 2012-11-19 – 2012-11-21 (×3): 10 [IU] via SUBCUTANEOUS
  Filled 2012-11-19 (×3): qty 0.1

## 2012-11-19 MED ORDER — NITROGLYCERIN 0.4 MG SL SUBL: 0.4000 mg | SUBLINGUAL_TABLET | SUBLINGUAL | Status: DC | PRN

## 2012-11-19 MED ORDER — HYDRALAZINE HCL 25 MG PO TABS
25.0000 mg | ORAL_TABLET | Freq: Two times a day (BID) | ORAL | Status: DC
  Administered 2012-11-19 – 2012-11-21 (×4): 25 mg via ORAL
  Filled 2012-11-19 (×5): qty 1

## 2012-11-19 MED ORDER — HYDRALAZINE HCL 25 MG PO TABS
25.0000 mg | ORAL_TABLET | Freq: Once | ORAL | Status: AC
  Administered 2012-11-19: 25 mg via ORAL
  Filled 2012-11-19: qty 1

## 2012-11-19 MED ORDER — ONDANSETRON HCL 4 MG/2ML IJ SOLN: 4.0000 mg | Freq: Four times a day (QID) | INTRAMUSCULAR | Status: DC | PRN

## 2012-11-19 MED ORDER — DIPHENHYDRAMINE-APAP (SLEEP) 25-500 MG PO TABS: 1.0000 | ORAL_TABLET | Freq: Every evening | ORAL | Status: DC | PRN

## 2012-11-19 MED ORDER — DIPHENHYDRAMINE HCL 25 MG PO CAPS
25.0000 mg | ORAL_CAPSULE | Freq: Every evening | ORAL | Status: DC | PRN
  Administered 2012-11-19 – 2012-11-20 (×2): 25 mg via ORAL
  Filled 2012-11-19 (×2): qty 1

## 2012-11-19 MED ORDER — NITROGLYCERIN 2 % TD OINT
1.0000 [in_us] | TOPICAL_OINTMENT | Freq: Once | TRANSDERMAL | Status: AC
  Administered 2012-11-19: 1 [in_us] via TOPICAL

## 2012-11-19 MED ORDER — NITROGLYCERIN 0.4 MG SL SUBL
0.4000 mg | SUBLINGUAL_TABLET | SUBLINGUAL | Status: DC | PRN
  Administered 2012-11-19 (×2): 0.4 mg via SUBLINGUAL

## 2012-11-19 MED ORDER — OMEGA-3 FATTY ACIDS 1000 MG PO CAPS: 2.0000 g | ORAL_CAPSULE | Freq: Every day | ORAL | Status: DC

## 2012-11-19 MED ORDER — DIPHENHYDRAMINE-APAP (SLEEP) 25-500 MG PO TABS: 2.0000 | ORAL_TABLET | Freq: Every day | ORAL | Status: DC

## 2012-11-19 MED ORDER — ZOLPIDEM TARTRATE 5 MG PO TABS: 5.0000 mg | ORAL_TABLET | Freq: Every evening | ORAL | Status: DC | PRN

## 2012-11-19 MED ORDER — OMEGA-3-ACID ETHYL ESTERS 1 G PO CAPS
2.0000 g | ORAL_CAPSULE | Freq: Every day | ORAL | Status: DC
  Administered 2012-11-19 – 2012-11-21 (×3): 2 g via ORAL
  Filled 2012-11-19 (×3): qty 2

## 2012-11-19 MED ORDER — NITROGLYCERIN 2 % TD OINT
1.0000 [in_us] | TOPICAL_OINTMENT | Freq: Once | TRANSDERMAL | Status: DC
  Filled 2012-11-19: qty 1

## 2012-11-19 MED ORDER — ACETAMINOPHEN 325 MG PO TABS: 975.0000 mg | ORAL_TABLET | Freq: Every evening | ORAL | Status: DC | PRN

## 2012-11-19 MED ORDER — ATORVASTATIN CALCIUM 80 MG PO TABS
80.0000 mg | ORAL_TABLET | Freq: Every day | ORAL | Status: DC
  Administered 2012-11-19 – 2012-11-21 (×3): 80 mg via ORAL
  Filled 2012-11-19 (×3): qty 1

## 2012-11-19 MED ORDER — ASPIRIN EC 81 MG PO TBEC
81.0000 mg | DELAYED_RELEASE_TABLET | Freq: Every day | ORAL | Status: DC
  Administered 2012-11-19 – 2012-11-21 (×3): 81 mg via ORAL
  Filled 2012-11-19 (×3): qty 1

## 2012-11-19 MED ORDER — ISOSORBIDE MONONITRATE ER 60 MG PO TB24
60.0000 mg | ORAL_TABLET | Freq: Every day | ORAL | Status: DC
  Administered 2012-11-19: 60 mg via ORAL
  Filled 2012-11-19 (×2): qty 1

## 2012-11-19 MED ORDER — ACETAMINOPHEN 325 MG PO TABS
650.0000 mg | ORAL_TABLET | ORAL | Status: DC | PRN
  Filled 2012-11-19: qty 2

## 2012-11-19 MED ORDER — ACETAMINOPHEN 500 MG PO TABS
500.0000 mg | ORAL_TABLET | Freq: Every evening | ORAL | Status: DC | PRN
  Administered 2012-11-19 – 2012-11-20 (×2): 500 mg via ORAL
  Filled 2012-11-19 (×2): qty 1

## 2012-11-19 NOTE — ED Notes (Addendum)
Patient states he started having substernal CP again. Rates pain 3/10. RN to administer NTG SL.

## 2012-11-19 NOTE — Progress Notes (Signed)
Utilization review completed.  

## 2012-11-19 NOTE — H&P (Addendum)
Wesley [name] is an 79 y.o. male.   Chief Complaint: chest pain HPI: Wesley [name] is a 79 yo man with hospitalization 4/15 - 4/18 for NSTEMI, known CAD previously, CABG '00, angioplasty to native ramus 11/13, T2DM, HLD, chronic kidney disease, ischemic cardiomyopathy, hypertension, prior CVA, PVD, DVT/PE in past on chronic warfarin with last echo 10/04/12 with EF 60-65% who presents after waking up and having severe chest pain. In the ER he was found to have elevated POC troponin and aspirin given and ntg with improvement of CP. He described the pain as pressure-substernal, no overt radiation, improved with SL, resolved with SL NTG x4 from EMS provider.   He was recently seen by Dr. Patty Sermons and his history reviewed; last [facility] 09/20/12 with ostial LM 30%, mLAD occluded, pD1 80%, mLCx occluded, OM1 30%, pRCA occluded, SVG-ramus patent, native ramus 80%, SVG-OM patent, native OM2 occluded but filled via right collaterals, LIMA to LAD patent, SVG to RCA m50% and culprit for NSTEMI thought newly occluded OM2 with right to left collaterals. No ACE- 2/2 CKD, on hydralazine/nitrates, now on atorvastatin.   We had a long discussion and patient will do what needs to be done but he doesn't like the idea of cardiac catheterization every 2 months (expensive) and tolerated the chest pain rather well tonight but it was the first episode since his April hospitalization.      Past Medical History  Diagnosis Date  . Diabetes mellitus   . Hyperlipidemia   . Carotid artery occlusion   . Calf DVT (deep venous thrombosis)     a. hx of right calf - chronic coumadin  . PVD (peripheral vascular disease)     hx of  . Coronary artery disease     a. s/p MI & CABG in 2000;  b. 2012 Neg MV;  c. 04/2012 Cath/PTCA:  VG-> RI patent w/ 99 in native RI (PTCA only );  d.  LHC 09/20/12:  oLM 30%, mid LAD occluded, prox D1 80%, mid CFX occluded, OM1 30%, proximal RCA occluded, S-RI, native RI 80% (stable from previous study), S-OM patent,  native OM2 occluded (filled via R collats) - culprit, L-LAD patent, S-RCA mid 50%. - med rx  . Hx pulmonary embolism     a. bilateral;  b. Chronic coumadin  . Stroke   . Ischemic cardiomyopathy     a. 04/2012 Echo: EF 40-45%.;  b.  Echo 5/14: EF 60-65%, NL WM, Gr 1 DD  . DVT (deep venous thrombosis)   . Hypertension   . Arthritis   . CKD (chronic kidney disease)     Past Surgical History  Procedure Laterality Date  . Coronary artery bypass graft  2000    4 vessel cabg in [place], [place].  . Cardiac catheterization  04/2012    patent grafts, severe three-vessel CAD, severe diffuse small vessel disease and residual CAD. He underwent angioplasty alone to a 99% ramus intermedius lesion with resultant dissection and 70% restenosis.  . Carotid endarterectomy  2000    bilateral  . Lithotripsy    . Cardiac catheterization  09/2012    Occlusion of the native OM 2 with right-to-left collateral filling, prior balloon angioplasty site unchanged, SVG-RCA was unchanged, mildly elevated LVEDP    Family History  Problem Relation Age of Onset  . Stroke Mother     age 68  . Other Father     pulmonary embolus age 50  . Diabetes Sister   . Stroke Sister   . Diabetes  Brother     brother was murdered  . Diabetes Sister    Social History:  reports that he has never smoked. He has never used smokeless tobacco. He reports that he does not drink alcohol or use illicit drugs.  Allergies:  Allergies  Allergen Reactions  . Doxycycline     swelling  . Morphine And Related     NAUSEA    . Niacin And Related Other (See Comments)    Hot flashes     (Not in a hospital admission)  Results for orders placed during the hospital encounter of 11/19/12 (from the past 48 hour(s))  CBC WITH DIFFERENTIAL     Status: Abnormal   Collection Time    11/19/12 12:53 AM      Result Value Range   WBC 8.0  4.0 - 10.5 K/uL   RBC 4.62  4.22 - 5.81 MIL/uL   Hemoglobin 14.5  13.0 - 17.0 g/dL   HCT 40.9  81.1 -  91.4 %   MCV 89.6  78.0 - 100.0 fL   MCH 31.4  26.0 - 34.0 pg   MCHC 35.0  30.0 - 36.0 g/dL   RDW 78.2  95.6 - 21.3 %   Platelets 141 (*) 150 - 400 K/uL   Neutrophils Relative % 54  43 - 77 %   Neutro Abs 4.3  1.7 - 7.7 K/uL   Lymphocytes Relative 32  12 - 46 %   Lymphs Abs 2.5  0.7 - 4.0 K/uL   Monocytes Relative 10  3 - 12 %   Monocytes Absolute 0.8  0.1 - 1.0 K/uL   Eosinophils Relative 3  0 - 5 %   Eosinophils Absolute 0.3  0.0 - 0.7 K/uL   Basophils Relative 1  0 - 1 %   Basophils Absolute 0.1  0.0 - 0.1 K/uL  PROTIME-INR     Status: Abnormal   Collection Time    11/19/12 12:53 AM      Result Value Range   Prothrombin Time 26.8 (*) 11.6 - 15.2 seconds   INR 2.63 (*) 0.00 - 1.49  POCT I-STAT TROPONIN I     Status: Abnormal   Collection Time    11/19/12 12:58 AM      Result Value Range   Troponin i, poc 0.20 (*) 0.00 - 0.08 ng/mL   Comment NOTIFIED PHYSICIAN     Comment 3            Comment: Due to the release kinetics of cTnI,     a negative result within the first hours     of the onset of symptoms does not rule out     myocardial infarction with certainty.     If myocardial infarction is still suspected,     repeat the test at appropriate intervals.  POCT I-STAT, CHEM 8     Status: Abnormal   Collection Time    11/19/12 12:59 AM      Result Value Range   Sodium 141  135 - 145 mEq/L   Potassium 4.3  3.5 - 5.1 mEq/L   Chloride 104  96 - 112 mEq/L   BUN 18  6 - 23 mg/dL   Creatinine, Ser 0.86 (*) 0.50 - 1.35 mg/dL   Glucose, Bld 578 (*) 70 - 99 mg/dL   Calcium, Ion 4.69  6.29 - 1.30 mmol/L   TCO2 29  0 - 100 mmol/L   Hemoglobin 14.3  13.0 - 17.0 g/dL   HCT  42.0  39.0 - 52.0 %   Dg Chest Port 1 View  11/19/2012   *RADIOLOGY REPORT*  Clinical Data: Chest pain  PORTABLE CHEST - 1 VIEW  Comparison: 09/18/2012  Findings: Stable changes of median sternotomy for CABG.  Normal heart size.  Atherosclerotic calcification thoracic aortic arch. Low lung volumes bilaterally.   Mild bibasilar atelectasis.  No airspace disease, effusion, or pneumothorax.  IMPRESSION: No acute cardiopulmonary disease.   Original Report Authenticated By: Britta Mccreedy, M.D.    Review of Systems  Constitutional: Negative for fever, chills and weight loss.  HENT: Positive for hearing loss. Negative for ear pain, neck pain and tinnitus.   Eyes: Negative for double vision, photophobia and pain.  Respiratory: Negative for cough and hemoptysis.   Cardiovascular: Positive for chest pain. Negative for palpitations, orthopnea and claudication.  Gastrointestinal: Negative for nausea, vomiting and abdominal pain.  Genitourinary: Negative for urgency and frequency.  Musculoskeletal: Positive for joint pain. Negative for myalgias.  Skin: Negative for itching and rash.  Neurological: Negative for dizziness, tingling, tremors and headaches.  Endo/Heme/Allergies: Negative for polydipsia. Bruises/bleeds easily.  Psychiatric/Behavioral: Negative for depression, suicidal ideas and substance abuse.    Blood pressure 136/70, pulse 52, temperature 98.2 F (36.8 C), temperature source Oral, resp. rate 19, SpO2 100.00%. Physical Exam  Nursing note and vitals reviewed. Constitutional: He is oriented to person, place, and time. He appears well-developed and well-nourished. No distress.  HENT:  Head: Normocephalic and atraumatic.  Nose: Nose normal.  Mouth/Throat: Oropharynx is clear and moist. No oropharyngeal exudate.  Eyes: Conjunctivae and EOM are normal. Pupils are equal, round, and reactive to light. No scleral icterus.  Neck: Normal range of motion. Neck supple. No JVD present. No tracheal deviation present. No thyromegaly present.  Cardiovascular: Normal rate, regular rhythm, normal heart sounds and intact distal pulses.  Exam reveals no gallop.   No murmur heard. Respiratory: Effort normal and breath sounds normal. No respiratory distress. He has no wheezes.  GI: Soft. Bowel sounds are normal. He  exhibits no distension. There is no tenderness. There is no rebound.  Musculoskeletal: Normal range of motion. He exhibits no edema and no tenderness.  Neurological: He is alert and oriented to person, place, and time. No cranial nerve deficit.  Skin: Skin is warm and dry. He is not diaphoretic.  Psychiatric: He has a normal mood and affect. His behavior is normal.    Labs reviewed; na 141, K 4.3, bun/cr 18/1.7, hb/hct 14.5, 41.5, plt 140s, INR 2.63, troponin 0.2 ECG reviewed; sinus bradycardia, 1st degree AV block, subtle ST changes Echo reviewed above; EF 60-65%, grade I DD from 5/14  Problem List Chest Pain/Unstable Angina/NSTEMI Known CAD DVT/PE on chronic anticoagulation Hyperlipidemia PVD hypertension T2DM Chronic Kidney Disease Assessment/Plan 79 yo man with known CAD, prior CABG, significant native disease with patent grafts here with unstable angina and positive POC troponin c/w likely NSTEMI. He has significant native disease with fairly patent grafts. Symptom management is key and he has been on hydralazine/nitrates, atorva, no ACE- given CKD, on bb. Important to trend troponins, assess response to nitroglycerin and lean towards LHC if revascularization is option. Otherwise, will need to titrate outpatient medication as able such as imdur, consider ranolazine. If troponin doesn't rise much and he continues to be chest pain free, then medical management might be reasonable therapy with cardiac rehabilitation.  - telemetry - trend troponins - continue aspirin; INR 2.63 on warfarin 5 mg qdaily - holding dose (he took on 6/15)  in case of procedure - update labs - on coreg 6.25 mg bid; hyralazine/imdur - gave 10 units lantus in AM, typically takes 25 units so please update dose if he eats in AM - NPO after MN in case of procedure - insulin SS NPO status written for  KELLY, JACOB 11/19/2012, 3:03 AM   Plan as outlined above; patient presently pain free. Troponin is mildly  elevated in setting of mild renal insuff. Cath 2 months ago as outlined and medical therapy recommended. Plan to continue to cycle enzymes. If significant trend up, will need cath (hold coumadin for now); if no clear trend, may be reasonable to ambulate and if no pain, proceed with functional study; if no ischemia or low risk, continue medical therapy. Olga Millers

## 2012-11-19 NOTE — ED Notes (Signed)
Checked CBG 127

## 2012-11-19 NOTE — ED Notes (Signed)
Pt woke approx 9pm  with CP described as 5/10 and a aching pulled muscle feel similar to previous MIs experienced (X4). Pt used 3x SLNTG without relief.  EMS called and arrived approx 2330 and gave 324mg  ASA and 2  Additional SLNTG for 5/10 CP that resolved to 0/10.

## 2012-11-19 NOTE — Progress Notes (Signed)
Troponin of 0.71 called to [facility]. No new orders. Pt denies chest pain. Will cont to monitor

## 2012-11-19 NOTE — ED Notes (Signed)
Dr. Tresa Endo w/cardiologist at bedside.

## 2012-11-19 NOTE — ED Notes (Signed)
i stat troponin results given to Dr. Nicanor Alcon by B. Bing Plume, EMT

## 2012-11-19 NOTE — Progress Notes (Signed)
Troponin of 0.86 called to [facility] Giv[facility]. Pt denies chest pain. No new orders. Will cont to monitor

## 2012-11-19 NOTE — ED Provider Notes (Signed)
History     CSN: [id number]  Arrival date & time 11/19/12  0009   First MD Initiated Contact with Patient 11/19/12 0010      Chief Complaint  Patient presents with  . Chest Pain    (Consider location/radiation/quality/duration/timing/severity/associated sxs/prior treatment) Patient is a 79 y.o. male presenting with chest pain. The history is provided by the patient. No language interpreter was used.  Chest Pain Pain location:  L chest Pain quality: dull   Pain radiates to:  L arm Pain radiates to the back: no   Pain severity:  Moderate Onset quality:  Sudden Duration:  3 hours Timing:  Constant Progression:  Resolved Chronicity:  Recurrent Context: at rest   Relieved by:  Nitroglycerin and aspirin Worsened by:  Nothing tried Ineffective treatments:  None tried Associated symptoms: no fever and not vomiting   Risk factors: coronary artery disease and diabetes mellitus     Past Medical History  Diagnosis Date  . Diabetes mellitus   . Hyperlipidemia   . Carotid artery occlusion   . Calf DVT (deep venous thrombosis)     a. hx of right calf - chronic coumadin  . PVD (peripheral vascular disease)     hx of  . Coronary artery disease     a. s/p MI & CABG in 2000;  b. 2012 Neg MV;  c. 04/2012 Cath/PTCA:  VG-> RI patent w/ 99 in native RI (PTCA only );  d.  LHC 09/20/12:  oLM 30%, mid LAD occluded, prox D1 80%, mid CFX occluded, OM1 30%, proximal RCA occluded, S-RI, native RI 80% (stable from previous study), S-OM patent, native OM2 occluded (filled via R collats) - culprit, L-LAD patent, S-RCA mid 50%. - med rx  . Hx pulmonary embolism     a. bilateral;  b. Chronic coumadin  . Stroke   . Ischemic cardiomyopathy     a. 04/2012 Echo: EF 40-45%.;  b.  Echo 5/14: EF 60-65%, NL WM, Gr 1 DD  . DVT (deep venous thrombosis)   . Hypertension   . Arthritis   . CKD (chronic kidney disease)     Past Surgical History  Procedure Laterality Date  . Coronary artery bypass graft   2000    4 vessel cabg in [place], [place].  . Cardiac catheterization  04/2012    patent grafts, severe three-vessel CAD, severe diffuse small vessel disease and residual CAD. He underwent angioplasty alone to a 99% ramus intermedius lesion with resultant dissection and 70% restenosis.  . Carotid endarterectomy  2000    bilateral  . Lithotripsy    . Cardiac catheterization  09/2012    Occlusion of the native OM 2 with right-to-left collateral filling, prior balloon angioplasty site unchanged, SVG-RCA was unchanged, mildly elevated LVEDP    Family History  Problem Relation Age of Onset  . Stroke Mother     age 43  . Other Father     pulmonary embolus age 75  . Diabetes Sister   . Stroke Sister   . Diabetes Brother     brother was murdered  . Diabetes Sister     History  Substance Use Topics  . Smoking status: Never Smoker   . Smokeless tobacco: Never Used  . Alcohol Use: No      Review of Systems  Constitutional: Negative for fever.  HENT: Negative for neck pain.   Cardiovascular: Positive for chest pain.  Gastrointestinal: Negative for vomiting.  All other systems reviewed and are negative.  Allergies  Doxycycline; Morphine and related; and Niacin and related  Home Medications   Current Outpatient Rx  Name  Route  Sig  Dispense  Refill  . aspirin 81 MG tablet   Oral   Take 81 mg by mouth daily.         Marland Kitchen atorvastatin (LIPITOR) 80 MG tablet   Oral   Take 80 mg by mouth daily.         . carvedilol (COREG) 6.25 MG tablet   Oral   Take 6.25 mg by mouth 2 (two) times daily with a meal.         . carvedilol (COREG) 6.25 MG tablet      TAKE 1 TABLET BY MOUTH 2 TIMES A DAY WITH A MEAL   60 tablet   6   . diphenhydramine-acetaminophen (TYLENOL PM) 25-500 MG TABS   Oral   Take 2 tablets by mouth at bedtime.         . fish oil-omega-3 fatty acids 1000 MG capsule   Oral   Take 2 g by mouth daily.         . hydrALAZINE (APRESOLINE) 25 MG  tablet   Oral   Take 25 mg by mouth 2 (two) times daily.          . insulin glargine (LANTUS) 100 UNIT/ML injection   Subcutaneous   Inject 25 Units into the skin every morning.          . isosorbide mononitrate (IMDUR) 60 MG 24 hr tablet   Oral   Take 60 mg by mouth daily.         . nitroGLYCERIN (NITROSTAT) 0.4 MG SL tablet   Sublingual   Place 0.4 mg under the tongue every 5 (five) minutes x 3 doses as needed for chest pain.         Marland Kitchen warfarin (COUMADIN) 5 MG tablet   Oral   Take 5 mg by mouth every evening. As directed by Dr. Collins Scotland           BP 179/72  Temp(Src) 98.2 F (36.8 C) (Oral)  Resp 19  SpO2 100%  Physical Exam  Constitutional: He is oriented to person, place, and time. He appears well-developed and well-nourished. No distress.  HENT:  Head: Normocephalic and atraumatic.  Mouth/Throat: Oropharynx is clear and moist.  Eyes: Conjunctivae are normal. Pupils are equal, round, and reactive to light.  Neck: Normal range of motion. Neck supple.  Cardiovascular: Normal rate, regular rhythm and intact distal pulses.   Pulmonary/Chest: Effort normal and breath sounds normal. He has no wheezes. He has no rales.  Abdominal: Soft. Bowel sounds are normal. There is no tenderness. There is no rebound and no guarding.  Musculoskeletal: Normal range of motion.  Neurological: He is alert and oriented to person, place, and time.  Skin: Skin is warm and dry.  Psychiatric: He has a normal mood and affect.    ED Course  Procedures (including critical care time)  Labs Reviewed  CBC WITH DIFFERENTIAL  PROTIME-INR   No results found.   No diagnosis found.    MDM   Date: 11/19/2012  Rate: 58  Rhythm: sinus bradycardia  QRS Axis: normal  Intervals: PR prolonged  ST/T Wave abnormalities: nonspecific ST changes  Conduction Disutrbances:first-degree A-V block   Narrative Interpretation:   Old EKG Reviewed: changes noted       Case d/w Dr. Tresa Endo will  see the patient  Hamed Debella K Jeronda Don-Rasch, MD 11/19/12 0139

## 2012-11-19 NOTE — ED Notes (Signed)
Pt reports waking approx 2100 thi evening w/sudden onset of Left side chest pain, pt describes his pain as a pulled muscle, rates his pain 5/10. Pt states his pain feels similar as to when he had his previous MI's. Pt took Nitro x3 at home w/no relief, nitro x2 and ASA 324 mg given by EMS. Pt rates his pain 0/10

## 2012-11-20 DIAGNOSIS — I214 Non-ST elevation (NSTEMI) myocardial infarction: Secondary | ICD-10-CM

## 2012-11-20 LAB — GLUCOSE, CAPILLARY
Glucose-Capillary: 220 mg/dL — ABNORMAL HIGH (ref 70–99)
Glucose-Capillary: 172 mg/dL — ABNORMAL HIGH (ref 70–99)
Glucose-Capillary: 183 mg/dL — ABNORMAL HIGH (ref 70–99)

## 2012-11-20 MED ORDER — ISOSORBIDE MONONITRATE ER 60 MG PO TB24
120.0000 mg | ORAL_TABLET | Freq: Every day | ORAL | Status: DC
  Administered 2012-11-20 – 2012-11-21 (×2): 120 mg via ORAL
  Filled 2012-11-20 (×2): qty 2

## 2012-11-20 MED ORDER — BISACODYL 5 MG PO TBEC
5.0000 mg | DELAYED_RELEASE_TABLET | Freq: Every day | ORAL | Status: DC | PRN
  Administered 2012-11-20: 5 mg via ORAL
  Filled 2012-11-20: qty 1

## 2012-11-20 NOTE — Progress Notes (Signed)
CARDIAC REHAB PHASE I   PRE:  Rate/Rhythm: 62 SR  BP:  Supine: 120/58 Sitting:   Standing:    SaO2: 96 RA  MODE:  Ambulation: 500 ft   POST:  Rate/Rhythm: 69  BP:  Supine:   Sitting: 130/50  Standing:    SaO2: 96 RA 1155-1230 Assisted X 1 and used walker to ambulate. Gait unsteady without walker. States that he uses cane at home only sometime. He is much better with walker. Pt able to walk 500 feet, slow pace. He denies any cp or SOB. VS stable. Pt to recliner after walk with call light in reach.  Melina Copa RN 11/20/2012 12:30 PM

## 2012-11-20 NOTE — Progress Notes (Signed)
1610 Please advise if pt to ambulate. Noted troponin at .88. We are following pt and will await MD input. Luetta Nutting RNBSN

## 2012-11-20 NOTE — Progress Notes (Signed)
Patient ID: Wesley [name], male   DOB: 1932/03/31, 79 y.o.   MRN: [id number]   Patient Name: Wesley [name] Date of Encounter: 11/20/2012    SUBJECTIVE  Feels much better this morning. Wants  to go home. Family present.  CURRENT MEDS . aspirin EC  81 mg Oral Daily  . atorvastatin  80 mg Oral Daily  . carvedilol  6.25 mg Oral BID WC  . hydrALAZINE  25 mg Oral BID  . insulin glargine  10 Units Subcutaneous q morning - 10a  . isosorbide mononitrate  120 mg Oral Daily  . omega-3 acid ethyl esters  2 g Oral Daily    OBJECTIVE  Filed Vitals:   11/19/12 0636 11/19/12 1502 11/19/12 2008 11/20/12 0520  BP: 174/82 155/68 151/67 136/60  Pulse:  59 60 57  Temp:  98.3 F (36.8 C) 98 F (36.7 C) 97.8 F (36.6 C)  TempSrc:   Oral Oral  Resp:  18 18 18   Height:      Weight:    220 lb 9.6 oz (100.064 kg)  SpO2:  98% 97% 97%   No intake or output data in the 24 hours ending 11/20/12 0904 Filed Weights   11/19/12 0610 11/20/12 0520  Weight: 221 lb 14.4 oz (100.653 kg) 220 lb 9.6 oz (100.064 kg)    PHYSICAL EXAM  General: Pleasant, NAD. Neuro: Alert and oriented X 3. Moves all extremities spontaneously. Psych: Normal affect. HEENT:  Normal  Neck: Supple without bruits or JVD. Lungs:  Resp regular and unlabored, CTA. Heart: RRR no s3, s4, or murmurs. Abdomen: Soft, non-tender, non-distended, BS + x 4.  Extremities: No clubbing, cyanosis or edema. DP/PT/Radials 2+ and equal bilaterally.  Accessory Clinical Findings  CBC  Recent Labs  11/19/12 0053 11/19/12 0059 11/19/12 0658  WBC 8.0  --  8.3  NEUTROABS 4.3  --   --   HGB 14.5 14.3 14.3  HCT 41.4 42.0 40.7  MCV 89.6  --  88.3  PLT 141*  --  129*   Basic Metabolic Panel  Recent Labs  11/19/12 0059 11/19/12 0658  NA 141 142  K 4.3 3.8  CL 104 105  CO2  --  28  GLUCOSE 161* 113*  BUN 18 16  CREATININE 1.70* 1.44*  CALCIUM  --  8.3*   Liver Function Tests No results found for this basename: AST, ALT, ALKPHOS,  BILITOT, PROT, ALBUMIN,  in the last 72 hours No results found for this basename: LIPASE, AMYLASE,  in the last 72 hours Cardiac Enzymes  Recent Labs  11/19/12 0658 11/19/12 1137 11/19/12 1729  TROPONINI 0.71* 0.86* 0.88*   BNP No components found with this basename: POCBNP,  D-Dimer No results found for this basename: DDIMER,  in the last 72 hours Hemoglobin A1C No results found for this basename: HGBA1C,  in the last 72 hours Fasting Lipid Panel No results found for this basename: CHOL, HDL, LDLCALC, TRIG, CHOLHDL, LDLDIRECT,  in the last 72 hours Thyroid Function Tests  Recent Labs  11/19/12 0658  TSH 3.041    TELE Normal sinus rhythm  ECG  Not repeated this morning. He has EKG changes with T wave inversion inferior laterally which have not changed since May.  Radiology/Studies  Dg Chest Port 1 View  11/19/2012   *RADIOLOGY REPORT*  Clinical Data: Chest pain  PORTABLE CHEST - 1 VIEW  Comparison: 09/18/2012  Findings: Stable changes of median sternotomy for CABG.  Normal heart size.  Atherosclerotic calcification thoracic aortic  arch. Low lung volumes bilaterally.  Mild bibasilar atelectasis.  No airspace disease, effusion, or pneumothorax.  IMPRESSION: No acute cardiopulmonary disease.   Original Report Authenticated By: Britta Mccreedy, M.D.    ASSESSMENT AND PLAN  Principal Problem:   NSTEMI (non-ST elevated myocardial infarction) Active Problems:   Calf DVT (deep venous thrombosis)   PVD (peripheral vascular disease)   Coronary artery disease   Hx pulmonary embolism   Diabetes mellitus   CKD (chronic kidney disease), stage III   Hypertension, uncontrolled   Dyslipidemia    Ms. Helmstetter has severe three-vessel disease with recent cath showing patent grafts. His OM 2 was occluded at that time was felt to be his infarct vessel. He does have collaterals from right to left. With his long history of diabetes he probably has microvascular coronary disease. His  medications are not that aggressive at this point. Resting heart rate is in 50s so we'll not increase his carvedilol. I'll increase his isosorbide mononitrate to 90 mg every morning. Blood pressures relatively good. We'll ambulate and if no further discomfort will discharge in the morning. I had a very long discussion and answering many questions with the family and the patient about the unpredictable nature of his discomfort going forward. It is very clear that he continues to have positive troponins when he is admitted. We have to assume that he is having micro-infarct. He has very good LV function this was prognosis is actually good for his recurrent symptoms or frustrating and difficult to control. After long discussion, he agrees to stay today.  Signed, Valera Castle MD

## 2012-11-21 ENCOUNTER — Encounter (HOSPITAL_COMMUNITY): Payer: Self-pay | Admitting: Nurse Practitioner

## 2012-11-21 DIAGNOSIS — I2510 Atherosclerotic heart disease of native coronary artery without angina pectoris: Secondary | ICD-10-CM

## 2012-11-21 LAB — GLUCOSE, CAPILLARY
Glucose-Capillary: 155 mg/dL — ABNORMAL HIGH (ref 70–99)
Glucose-Capillary: 149 mg/dL — ABNORMAL HIGH (ref 70–99)
Glucose-Capillary: 178 mg/dL — ABNORMAL HIGH (ref 70–99)

## 2012-11-21 MED ORDER — ISOSORBIDE MONONITRATE ER 120 MG PO TB24: 120.0000 mg | ORAL_TABLET | Freq: Every day | ORAL | Status: DC

## 2012-11-21 NOTE — Progress Notes (Signed)
Patient Name: Wesley [name] Date of Encounter: 11/21/2012    Principal Problem:   NSTEMI (non-ST elevated myocardial infarction) Active Problems:   Coronary artery disease   Hx pulmonary embolism   Diabetes mellitus   CKD (chronic kidney disease), stage III   Hypertension, uncontrolled   Calf DVT (deep venous thrombosis)   PVD (peripheral vascular disease)   Dyslipidemia   SUBJECTIVE  No reports of any chest pain, sob, palpitations, nausea or vomiting. Has not had a BM in one week. Wants to go home.  Ambulating w/o recurrent Ss.  CURRENT MEDS . aspirin EC  81 mg Oral Daily  . atorvastatin  80 mg Oral Daily  . carvedilol  6.25 mg Oral BID WC  . hydrALAZINE  25 mg Oral BID  . insulin glargine  10 Units Subcutaneous q morning - 10a  . isosorbide mononitrate  120 mg Oral Daily  . omega-3 acid ethyl esters  2 g Oral Daily   OBJECTIVE  Filed Vitals:   11/20/12 1404 11/20/12 1624 11/20/12 2050 11/21/12 0500  BP: 129/62 146/61 125/68 160/82  Pulse: 64 65 60 57  Temp: 98.1 F (36.7 C)  98 F (36.7 C) 97.7 F (36.5 C)  TempSrc: Oral  Oral Oral  Resp: 18  18 18   Height:      Weight:    221 lb (100.245 kg)  SpO2: 100% 99% 96% 96%    Intake/Output Summary (Last 24 hours) at 11/21/12 0747 Last data filed at 11/20/12 1700  Gross per 24 hour  Intake   1080 ml  Output      0 ml  Net   1080 ml   Filed Weights   11/19/12 0610 11/20/12 0520 11/21/12 0500  Weight: 221 lb 14.4 oz (100.653 kg) 220 lb 9.6 oz (100.064 kg) 221 lb (100.245 kg)    PHYSICAL EXAM  General: Pleasant, NAD. Neuro: Alert and oriented X 3. Moves all extremities spontaneously. Psych: Normal affect. HEENT:  Normal  Neck: Supple without bruits or JVD. Lungs:  Resp regular and unlabored, CTA. Heart: RRR no s3, s4, or murmurs. Abdomen: Soft, non-tender, non-distended, BS + x 4.  Extremities: No clubbing, cyanosis or edema. DP/PT trace bilaterally.  Accessory Clinical Findings  CBC  Recent Labs  11/19/12 0053 11/19/12 0059 11/19/12 0658  WBC 8.0  --  8.3  NEUTROABS 4.3  --   --   HGB 14.5 14.3 14.3  HCT 41.4 42.0 40.7  MCV 89.6  --  88.3  PLT 141*  --  129*   Basic Metabolic Panel  Recent Labs  11/19/12 0059 11/19/12 0658  NA 141 142  K 4.3 3.8  CL 104 105  CO2  --  28  GLUCOSE 161* 113*  BUN 18 16  CREATININE 1.70* 1.44*  CALCIUM  --  8.3*   Cardiac Enzymes  Recent Labs  11/19/12 0658 11/19/12 1137 11/19/12 1729  TROPONINI 0.71* 0.86* 0.88*   Thyroid Function Tests  Recent Labs  11/19/12 0658  TSH 3.041    TELE  NSR 64  Radiology/Studies  Dg Chest Port 1 View  11/19/2012   *RADIOLOGY REPORT*  Clinical Data: Chest pain  PORTABLE CHEST - 1 VIEW  Comparison: 09/18/2012  Findings: Stable changes of median sternotomy for CABG.  Normal heart size.  Atherosclerotic calcification thoracic aortic arch. Low lung volumes bilaterally.  Mild bibasilar atelectasis.  No airspace disease, effusion, or pneumothorax.  IMPRESSION: No acute cardiopulmonary disease.   Original Report Authenticated By: Britta Mccreedy, M.D.  ASSESSMENT AND PLAN  1. NSTEMI/CAD:  Pt with known CAD. Last [facility] 09/20/12 for NSTEMI revealed patent grafts. His OM2 was occluded and felt to be the infarct vessel then. He does have collateral from right to left. He remains free of chest pain and his troponins did not rise much so he did not require return to the cath lab. It is felt that with his long history of diabetes, he probably has microvascular coronary disease. He is not on ACE because of chronic CKD.  His carvedilol was not increased due to his lower resting HR. He is on hydralazine/nitrates, bb, asa, statin. His isosorbide mononitrate was increased from 60 mg to 120 mg every morning and he has been ambulating w/o difficulty. Plan d/c today.  2. Hypertension: stable  3. Hyperlipidemia: cont statin.  4. Diabetes Mellitus, Type 2: cont home regimen.  5. Chronic Kidney Disease, III:   Stable this admission.  Signed, Nicolasa Ducking NP  Patient seen and examined independently. Gilford Raid, NP note reviewed carefully - agree with his assessment and plan. I have edited the note based on my findings.  Ok for d/c today. i suggested cardiac rehab but he refuses. F/u as outpatient.   Harly Pipkins,MD 2:15 PM

## 2012-11-21 NOTE — Progress Notes (Signed)
Pt discharged to home per MD order. Pt received and reviewed all discharge instructions and medication information including follow-up appointments and prescriptions. Pt verbalized understanding. Pt alert and oriented at discharge with no complaints of pain. Pt escorted to private vehicle via wheelchair by guest services volunteer. Wesley [name]

## 2012-11-21 NOTE — Discharge Summary (Signed)
Patient ID: Wesley [name],  MRN: [id number], DOB/AGE: Jun 24, 1931 79 y.o.  Admit date: 11/19/2012 Discharge date: 11/21/2012  Primary Care Provider: Herb Grays Primary Cardiologist: Wylene Simmer, MD  Discharge Diagnoses Principal Problem:   NSTEMI (non-ST elevated myocardial infarction)  **Medically managed Active Problems:   Coronary artery disease   Hx pulmonary embolism   Diabetes mellitus   CKD (chronic kidney disease), stage III   Hypertension, uncontrolled   Calf DVT (deep venous thrombosis)   PVD (peripheral vascular disease)   Dyslipidemia  Allergies Allergies  Allergen Reactions  . Doxycycline     swelling  . Morphine And Related     NAUSEA    . Niacin And Related Other (See Comments)    Hot flashes   Procedures  None  History of Present Illness  79 year old male with prior history of coronary artery disease status post coronary artery bypass grafting in 2000 underwent recent catheterization April of this year secondary to non-ST segment elevation myocardial infarction. Catheterization at that time revealed patent grafts with occluded native second obtuse marginal that filled via right to left collaterals. This was felt to be the infarct vessel and felt to be best treated with medical therapy. Following discharge, patient did well until the day of admission when he awoke with severe chest pain and presented to the [facility]. There, he was found to have an elevated troponin. Pain resolved following sublingual nitroglycerin. He was admitted for further evaluation.  Hospital Course  Patient ruled in for non-ST segment elevation myocardial infarction, eventually peaking his troponin at 0.88. Clinical situation was reviewed and discussed was held with patient as to how best to proceed. With minor troponin elevation in the setting of typical symptoms and mild renal insufficiency with recent catheterization outlining his anatomy, and it was mutually agreed that medical therapy  was warranted. His Imdur dose was titrated from 60- 120 mg daily. He was kept on his home dose of Coumadin as INR has been therapeutic throughout admission.  Over the past 24 hrs, he has been ambulating without recurrent chest discomfort.  He will be discharged home today in good condition.  Discharge Vitals Blood pressure 151/70, pulse 64, temperature 97.7 F (36.5 C), temperature source Oral, resp. rate 18, height 6\' 1"  (1.854 m), weight 221 lb (100.245 kg), SpO2 96.00%.  Filed Weights   11/19/12 0610 11/20/12 0520 11/21/12 0500  Weight: 221 lb 14.4 oz (100.653 kg) 220 lb 9.6 oz (100.064 kg) 221 lb (100.245 kg)   Labs  CBC  Recent Labs  11/19/12 0053 11/19/12 0059 11/19/12 0658  WBC 8.0  --  8.3  NEUTROABS 4.3  --   --   HGB 14.5 14.3 14.3  HCT 41.4 42.0 40.7  MCV 89.6  --  88.3  PLT 141*  --  129*   Basic Metabolic Panel  Recent Labs  11/19/12 0059 11/19/12 0658  NA 141 142  K 4.3 3.8  CL 104 105  CO2  --  28  GLUCOSE 161* 113*  BUN 18 16  CREATININE 1.70* 1.44*  CALCIUM  --  8.3*   Liver Function Tests Lab Results  Component Value Date   ALT 12 11/13/2012   AST 13 11/13/2012   ALKPHOS 58 11/13/2012   BILITOT 0.7 11/13/2012    Cardiac Enzymes  Recent Labs  11/19/12 0658 11/19/12 1137 11/19/12 1729  TROPONINI 0.71* 0.86* 0.88*   Fasting Lipid Panel Lab Results  Component Value Date   CHOL 136 11/13/2012   HDL 28.40*  11/13/2012   LDLDIRECT 73.7 11/13/2012   TRIG 218.0* 11/13/2012   CHOLHDL 5 11/13/2012    Thyroid Function Tests  Recent Labs  11/19/12 0658  TSH 3.041   Lab Results  Component Value Date   INR 2.88* 11/19/2012   INR 2.63* 11/19/2012   INR 1.23 09/21/2012   Disposition  Pt is being discharged home today in good condition.  Follow-up Plans & Appointments  Follow-up Information   Follow up with Norma Fredrickson, NP On 12/04/2012. (11:45 AM - Dr. Yevonne Pax Nurse Practitioner)    Contact information:   [address] [place] [address] [phone]       Follow up with Herb Grays, MD. (as scheduled.)    Contact information:   [address] [place] [address] [phone]      Discharge Medications    Medication List    TAKE these medications       aspirin 81 MG tablet  Take 81 mg by mouth daily.     atorvastatin 80 MG tablet  Commonly known as:  LIPITOR  Take 80 mg by mouth daily.     carvedilol 6.25 MG tablet  Commonly known as:  COREG  Take 6.25 mg by mouth 2 (two) times daily with a meal.     diphenhydramine-acetaminophen 25-500 MG Tabs  Commonly known as:  TYLENOL PM  Take 2 tablets by mouth at bedtime.     fish oil-omega-3 fatty acids 1000 MG capsule  Take 2 g by mouth daily.     hydrALAZINE 25 MG tablet  Commonly known as:  APRESOLINE  Take 25 mg by mouth 2 (two) times daily.     insulin glargine 100 UNIT/ML injection  Commonly known as:  LANTUS  Inject 25 Units into the skin every morning.     isosorbide mononitrate 120 MG 24 hr tablet  Commonly known as:  IMDUR  Take 1 tablet (120 mg total) by mouth daily.     nitroGLYCERIN 0.4 MG SL tablet  Commonly known as:  NITROSTAT  Place 0.4 mg under the tongue every 5 (five) minutes x 3 doses as needed for chest pain.     warfarin 5 MG tablet  Commonly known as:  COUMADIN  Take 5 mg by mouth every evening. As directed by Dr. Collins Scotland      Outstanding Labs/Studies  F/u INR with Dr. Collins Scotland as previously scheduled.  Duration of Discharge Encounter   Greater than 30 minutes including physician time.  Signed, Nicolasa Ducking NP 11/21/2012, 1:01 PM   Patient seen and examined independently. Gilford Raid, NP note reviewed carefully - agree with his assessment and plan. I have edited the note based on my findings.   Agree. Stable for d/c. I recommended cardiac rehab but he refuses. Continue RF modification.   Daniel Bensimhon,MD 2:16 PM

## 2012-11-21 NOTE — Progress Notes (Signed)
Reviewed NTG use and ex gl. Pt asked appropriate questions.  [phone] Ethelda Chick CES, ACSM 1:58 PM 11/21/2012

## 2012-12-04 ENCOUNTER — Encounter: Payer: Self-pay | Admitting: Nurse Practitioner

## 2012-12-04 ENCOUNTER — Ambulatory Visit (INDEPENDENT_AMBULATORY_CARE_PROVIDER_SITE_OTHER): Payer: Medicare Other | Admitting: Nurse Practitioner

## 2012-12-04 VITALS — BP 140/64 | HR 56 | Ht 74.0 in | Wt 222.8 lb

## 2012-12-04 DIAGNOSIS — I259 Chronic ischemic heart disease, unspecified: Secondary | ICD-10-CM

## 2012-12-04 NOTE — Patient Instructions (Addendum)
Stay on your current medicines  Do NOT take the aspirin 325 mg   See Dr. Patty Sermons as planned in October - come fasting  Call the [facility] office at [phone] if you have any questions, problems or concerns.

## 2012-12-04 NOTE — Progress Notes (Signed)
Wesley [name] Date of Birth: September 21, 1931 Medical Record #[id number]  History of Present Illness: Wesley [name] is seen back today for a post hospital visit (TOC). Seenfor Dr. Patty Sermons. Has CAD with prior CABG in 2000, prior PE, DM, CKD stage III, HTN, DVT, PVD, prior stroke and HLD. Has had cath in April of this year due to NSTEMI. Cath at that time showed patent grafts with occluded native 2nd OM that filled via right to left collaterals. Felt to be best managed medically.   Most recently with recurrent NSTEMI - Peak troponin was 0.88. His clinical situation was reviewed and it was felt that medical therapy needed to be continued. Imdur was titrated up. No repeat cath was undertaken.   Comes in today. Here alone. He is doing ok from a cardiac standpoint. Had a spell of chest pain the day after discharge - lasted about 30 minutes - then resolved without intervention and has not had recurrence. Breathing is stable. He is mostly limited by his balance and strength due to prior stroke. No falls. Uses a cane. Tolerating his medicines.   Current Outpatient Prescriptions  Medication Sig Dispense Refill  . aspirin 81 MG tablet Take 81 mg by mouth daily.      Marland Kitchen atorvastatin (LIPITOR) 80 MG tablet Take 80 mg by mouth daily.      . carvedilol (COREG) 6.25 MG tablet Take 6.25 mg by mouth 2 (two) times daily with a meal.      . diphenhydramine-acetaminophen (TYLENOL PM) 25-500 MG TABS Take 2 tablets by mouth at bedtime.      . fish oil-omega-3 fatty acids 1000 MG capsule Take 2 g by mouth daily.      . hydrALAZINE (APRESOLINE) 25 MG tablet Take 25 mg by mouth 2 (two) times daily.       . insulin glargine (LANTUS) 100 UNIT/ML injection Inject 25 Units into the skin every morning.       . isosorbide mononitrate (IMDUR) 120 MG 24 hr tablet Take 1 tablet (120 mg total) by mouth daily.  30 tablet  6  . nitroGLYCERIN (NITROSTAT) 0.4 MG SL tablet Place 0.4 mg under the tongue every 5 (five) minutes x 3 doses as  needed for chest pain.      Marland Kitchen warfarin (COUMADIN) 5 MG tablet Take 5 mg by mouth every evening. As directed by Dr. Collins Scotland       No current facility-administered medications for this visit.    Allergies  Allergen Reactions  . Doxycycline     swelling  . Morphine And Related     NAUSEA    . Niacin And Related Other (See Comments)    Hot flashes    Past Medical History  Diagnosis Date  . Diabetes mellitus   . Hyperlipidemia   . Carotid artery occlusion   . Calf DVT (deep venous thrombosis)     a. hx of right calf - chronic coumadin  . PVD (peripheral vascular disease)     hx of  . Coronary artery disease     a. s/p MI & CABG in 2000;  b. 04/2012 Cath/PTCA:  VG-> RI patent w/ 99 in native RI (PTCA only );  c.  LHC 09/20/12:  oLM 30%, mid LAD occluded, prox D1 80%, mid CFX 100, OM1 30, prox RCA 100, S-RI, native RI 80% (stable), S-OM patent, native OM2 100 (filled via R collats) - culprit, L-LAD patent, S-RCA mid 50%. - med rx; e. 11/2012 NSTEMI->med rx.  Marland Kitchen Hx  pulmonary embolism     a. bilateral;  b. Chronic coumadin  . Stroke   . Ischemic cardiomyopathy     a. 04/2012 Echo: EF 40-45%.;  b.  Echo 5/14: EF 60-65%, NL WM, Gr 1 DD  . DVT (deep venous thrombosis)   . Hypertension   . Arthritis   . CKD (chronic kidney disease)     Past Surgical History  Procedure Laterality Date  . Coronary artery bypass graft  2000    4 vessel cabg in [place], [place].  . Cardiac catheterization  04/2012    patent grafts, severe three-vessel CAD, severe diffuse small vessel disease and residual CAD. He underwent angioplasty alone to a 99% ramus intermedius lesion with resultant dissection and 70% restenosis.  . Carotid endarterectomy  2000    bilateral  . Lithotripsy    . Cardiac catheterization  09/2012    Occlusion of the native OM 2 with right-to-left collateral filling, prior balloon angioplasty site unchanged, SVG-RCA was unchanged, mildly elevated LVEDP    History  Smoking status  .  Never Smoker   Smokeless tobacco  . Never Used    History  Alcohol Use No    Family History  Problem Relation Age of Onset  . Stroke Mother     age 4  . Other Father     pulmonary embolus age 51  . Diabetes Sister   . Stroke Sister   . Diabetes Brother     brother was murdered  . Diabetes Sister     Review of Systems: The review of systems is per the HPI.  All other systems were reviewed and are negative.  Physical Exam: BP 140/64  Pulse 56  Ht 6\' 2"  (1.88 m)  Wt 222 lb 12.8 oz (101.061 kg)  BMI 28.59 kg/m2 Patient is very pleasant and in no acute distress. Skin is warm and dry. Color is normal.  HEENT is unremarkable. Normocephalic/atraumatic. PERRL. Sclera are nonicteric. Neck is supple. No masses. No JVD. Lungs are clear. Cardiac exam shows a regular rate and rhythm. Abdomen is soft. Extremities are with trace edema. Gait and ROM are intact. Using a cane. No gross neurologic deficits noted.  LABORATORY DATA: Lab Results  Component Value Date   WBC 8.3 11/19/2012   HGB 14.3 11/19/2012   HCT 40.7 11/19/2012   PLT 129* 11/19/2012   GLUCOSE 113* 11/19/2012   CHOL 136 11/13/2012   TRIG 218.0* 11/13/2012   HDL 28.40* 11/13/2012   LDLDIRECT 73.7 11/13/2012   LDLCALC 123* 09/19/2012   ALT 12 11/13/2012   AST 13 11/13/2012   NA 142 11/19/2012   K 3.8 11/19/2012   CL 105 11/19/2012   CREATININE 1.44* 11/19/2012   BUN 16 11/19/2012   CO2 28 11/19/2012   TSH 3.041 11/19/2012   INR 2.88* 11/19/2012   HGBA1C 6.4* 09/18/2012    Lab Results  Component Value Date   CKTOTAL 137 01/31/2009   CKMB 2.6 01/31/2009   TROPONINI 0.88* 11/19/2012   Echo Study Conclusions from May 2014  - Left ventricle: The cavity size was normal. Wall thickness was normal. Systolic function was normal. The estimated ejection fraction was in the range of 60% to 65%. Wall motion was normal; there were no regional wall motion abnormalities. Doppler parameters are consistent with abnormal left ventricular  relaxation (grade 1 diastolic dysfunction). - Atrial septum: No defect or patent foramen ovale was identified.   Assessment / Plan: 1. CAD with remote CABG - NSTEMI in April of  2014 and again here in June of 2014 - managed medically. Cath has showed that his grafts are patent but did have occluded native 2nd OM that filled via right to left collaterals. Doing ok. His EF was normal. No change in current regimen.   2. PE - on coumadin  3. Prior CVA - with residual balance and strength issues - no falls - this seems to be his most limiting factor.   4. HTN - BP is ok.   We will see him back as planned in October.   Patient is agreeable to this plan and will call if any problems develop in the interim.   Rosalio Macadamia, RN, ANP-C [facility] [address] [place], [place]  [address]

## 2013-01-07 ENCOUNTER — Ambulatory Visit (INDEPENDENT_AMBULATORY_CARE_PROVIDER_SITE_OTHER): Payer: Medicare Other | Admitting: Family Medicine

## 2013-01-07 ENCOUNTER — Encounter: Payer: Self-pay | Admitting: Family Medicine

## 2013-01-07 VITALS — BP 134/76 | HR 58 | Temp 97.8°F | Resp 18 | Ht 74.0 in | Wt 224.0 lb

## 2013-01-07 DIAGNOSIS — E119 Type 2 diabetes mellitus without complications: Secondary | ICD-10-CM

## 2013-01-07 DIAGNOSIS — Z86718 Personal history of other venous thrombosis and embolism: Secondary | ICD-10-CM

## 2013-01-07 DIAGNOSIS — I10 Essential (primary) hypertension: Secondary | ICD-10-CM

## 2013-01-07 DIAGNOSIS — Z8673 Personal history of transient ischemic attack (TIA), and cerebral infarction without residual deficits: Secondary | ICD-10-CM

## 2013-01-07 DIAGNOSIS — Z86711 Personal history of pulmonary embolism: Secondary | ICD-10-CM

## 2013-01-07 DIAGNOSIS — K5900 Constipation, unspecified: Secondary | ICD-10-CM

## 2013-01-07 NOTE — Progress Notes (Signed)
Office Note 01/08/2013  CC:  Chief Complaint  Patient presents with  . Anticoagulation  . Diabetes    HPI:  Wesley [name] is a 79 y.o. White male who is here to establish care. Patient's most recent primary MD: Dr. Yehuda Budd in [facility] insurer no longer had her in his network. Old records in EPIC/HL were reviewed prior to or during today's visit.  Complains of feeling off balance in his head/disequilibrium for the last couple of years--since about 59mo after his CVA per his report today.  He does not have this feeling unless he is walking.  He uses a cane, says he has not ever fallen.  He drives and has no problems but is apprehensive that he may have the feeling when he drives.  Has infrequent BMs, feels full/constipated a lot, much of the time lately when he has a BM it is a small amount of loose yellowish stool.  NO melena or hematochezia.  Has had some success in the past with dulcolax but says lately this hasn't been working.  Has used miralax in the past but not recently.  He does not monitor his glucoses.  Says he has had DM so long that he can tell when his glucose is high or low.   Past Medical History  Diagnosis Date  . Diabetes mellitus Onset approx 1980  . Hyperlipidemia   . Carotid artery occlusion   . Calf DVT (deep venous thrombosis)     a. hx of right calf - chronic coumadin  . PVD (peripheral vascular disease)     hx of  . Coronary artery disease     a. s/p MI & CABG in 2000;  b. 04/2012 Cath/PTCA:  VG-> RI patent w/ 99 in native RI (PTCA only );  c.  LHC 09/20/12:  oLM 30%, mid LAD occluded, prox D1 80%, mid CFX 100, OM1 30, prox RCA 100, S-RI, native RI 80% (stable), S-OM patent, native OM2 100 (filled via R collats) - culprit, L-LAD patent, S-RCA mid 50%. - med rx; e. 11/2012 NSTEMI->med rx.  Marland Kitchen Hx pulmonary embolism     a. bilateral;  b. Chronic coumadin  . Stroke 2011    [facility].  . Ischemic cardiomyopathy     a. 04/2012 Echo: EF  40-45%.;  b.  Echo 5/14: EF 60-65%, NL WM, Gr 1 DD  . Hypertension   . Arthritis     Low back and left knee  . CKD (chronic kidney disease)   . Nephrolithiasis     Past Surgical History  Procedure Laterality Date  . Coronary artery bypass graft  2000    4 vessel cabg in [place], [place].  . Cardiac catheterization  04/2012    patent grafts, severe three-vessel CAD, severe diffuse small vessel disease and residual CAD. He underwent angioplasty alone to a 99% ramus intermedius lesion with resultant dissection and 70% restenosis.  . Carotid endarterectomy  2000    bilateral  . Lithotripsy      (Dr. Annabell Howells)  . Cardiac catheterization  09/2012    Occlusion of the native OM 2 with right-to-left collateral filling, prior balloon angioplasty site unchanged, SVG-RCA was unchanged, mildly elevated LVEDP  . Colonoscopy  2004    Dr. Kizzie Bane in [place], [place].    Family History  Problem Relation Age of Onset  . Stroke Mother     age 8  . Other Father     pulmonary embolus age 53  . Diabetes Sister   .  Stroke Sister   . Diabetes Brother     brother was murdered  . Diabetes Sister     History   Social History  . Marital Status: Married    Spouse Name: N/A    Number of Children: N/A  . Years of Education: N/A   Occupational History  . Not on file.   Social History Main Topics  . Smoking status: Never Smoker   . Smokeless tobacco: Never Used  . Alcohol Use: No  . Drug Use: No  . Sexually Active: Not on file   Other Topics Concern  . Not on file   Social History Narrative   Widowed 2012, 1 daughter and 2 step daughters.   Lives alone in [place].   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   No T/A/Ds.    Outpatient Encounter Prescriptions as of 01/07/2013  Medication Sig Dispense Refill  . aspirin 81 MG tablet Take 81 mg by mouth daily.      Marland Kitchen atorvastatin (LIPITOR) 80 MG tablet Take 80 mg by mouth daily.       . carvedilol (COREG) 6.25 MG tablet Take 6.25 mg by mouth 2 (two) times daily with a meal.      . diphenhydramine-acetaminophen (TYLENOL PM) 25-500 MG TABS Take 2 tablets by mouth at bedtime.      . fish oil-omega-3 fatty acids 1000 MG capsule Take 2 g by mouth daily.      . hydrALAZINE (APRESOLINE) 25 MG tablet Take 25 mg by mouth 2 (two) times daily.       . insulin glargine (LANTUS) 100 UNIT/ML injection Inject 25 Units into the skin every morning.       . isosorbide mononitrate (IMDUR) 120 MG 24 hr tablet Take 1 tablet (120 mg total) by mouth daily.  30 tablet  6  . nitroGLYCERIN (NITROSTAT) 0.4 MG SL tablet Place 0.4 mg under the tongue every 5 (five) minutes x 3 doses as needed for chest pain.      Marland Kitchen warfarin (COUMADIN) 5 MG tablet Take 5 mg by mouth every evening. As directed by Dr. Collins Scotland       No facility-administered encounter medications on file as of 01/07/2013.    Allergies  Allergen Reactions  . Doxycycline     swelling  . Morphine And Related     NAUSEA    . Niacin And Related Other (See Comments)    Hot flashes    ROS Review of Systems  Constitutional: Positive for fatigue. Negative for fever.  HENT: Negative for congestion and sore throat.   Eyes: Negative for visual disturbance.  Respiratory: Negative for cough and shortness of breath.   Cardiovascular: Negative for chest pain and palpitations.  Gastrointestinal: Negative for nausea, abdominal pain and blood in stool.  Genitourinary: Negative for dysuria.  Musculoskeletal: Positive for back pain, arthralgias and gait problem. Negative for joint swelling.  Skin: Negative for rash.  Neurological: Negative for weakness and headaches.  Hematological: Negative for adenopathy.  Psychiatric/Behavioral: Negative for confusion and dysphoric mood.     PE; Blood pressure 134/76, pulse 58, temperature 97.8 F (36.6 C), temperature source Temporal, resp. rate 18, height 6\' 2"  (1.88 m), weight 224 lb (101.606 kg), SpO2  96.00%. Gen: Alert, well appearing.  Patient is oriented to person, place, time, and situation. AFFECT: pleasant, lucid thought and speech. ENT: Ears: EACs clear, normal epithelium.  TMs with good light reflex and landmarks bilaterally.  Eyes: no injection, icteris, swelling, or exudate.  EOMI, PERRLA. Nose: no drainage or turbinate edema/swelling.  No injection or focal lesion.  Mouth: lips without lesion/swelling.  Oral mucosa pink and moist.  Dentition intact and without obvious caries or gingival swelling.  Oropharynx without erythema, exudate, or swelling.  CV: RRR, no m/r/g Chest is clear, no wheezing or rales. Normal symmetric air entry throughout both lung fields. No chest wall deformities or tenderness. ABD: soft, NT/ND  Pertinent labs:  None today  ASSESSMENT AND PLAN:   New pt: obtain old records.  Type II or unspecified type diabetes mellitus without mention of complication, not stated as uncontrolled Historically well controlled. HbA1c today.  Hypertension, uncontrolled Stable.  Cont current meds.  Lytes/cr today.  Constipation, acute D/C dulcolax. Senakot S generic, 2 tabs po qhs. Miralax 1 capful bid x 3d, then use this prn.  History of pulmonary embolism Coumadin therapy--due for INR today.  History of CVA (cerebrovascular accident) Residual disequilibrium issues when walking. Obtain [facility]. We'll discuss this symptom more in depth at next f/u in 1 mo.  CKD (chronic kidney disease), stage III BMET today.   An After Visit Summary was printed and given to the patient.  Return in about 1 month (around 02/07/2013) for f/u constip, get INR, discuss fatigue and/or balance prob.

## 2013-01-07 NOTE — Patient Instructions (Signed)
Buy OTC med --generic for senakot S and take 2 tabs every night. For the next 3 days, take 1 capful of miralax powder twice a day.

## 2013-01-08 ENCOUNTER — Other Ambulatory Visit: Payer: Medicare Other

## 2013-01-08 ENCOUNTER — Encounter: Payer: Self-pay | Admitting: Family Medicine

## 2013-01-08 DIAGNOSIS — Z86718 Personal history of other venous thrombosis and embolism: Secondary | ICD-10-CM | POA: Insufficient documentation

## 2013-01-08 DIAGNOSIS — E119 Type 2 diabetes mellitus without complications: Secondary | ICD-10-CM | POA: Insufficient documentation

## 2013-01-08 DIAGNOSIS — Z86711 Personal history of pulmonary embolism: Secondary | ICD-10-CM | POA: Insufficient documentation

## 2013-01-08 DIAGNOSIS — N189 Chronic kidney disease, unspecified: Secondary | ICD-10-CM | POA: Insufficient documentation

## 2013-01-08 LAB — BASIC METABOLIC PANEL
CO2: 23 mEq/L (ref 19–32)
Calcium: 8.8 mg/dL (ref 8.4–10.5)
Creatinine, Ser: 1.3 mg/dL (ref 0.4–1.5)

## 2013-01-08 LAB — PROTIME-INR
INR: 2.1 ratio — ABNORMAL HIGH (ref 0.8–1.0)
Prothrombin Time: 22.3 s — ABNORMAL HIGH (ref 10.2–12.4)

## 2013-01-08 NOTE — Assessment & Plan Note (Signed)
Coumadin therapy--due for INR today.

## 2013-01-08 NOTE — Assessment & Plan Note (Signed)
D/C dulcolax. Senakot S generic, 2 tabs po qhs. Miralax 1 capful bid x 3d, then use this prn.

## 2013-01-08 NOTE — Assessment & Plan Note (Signed)
-   BMET today 

## 2013-01-08 NOTE — Assessment & Plan Note (Signed)
Historically well controlled. HbA1c today.

## 2013-01-08 NOTE — Assessment & Plan Note (Addendum)
Residual disequilibrium issues when walking. Obtain [facility] records. We'll discuss this symptom more in depth at next f/u in 1 mo.

## 2013-01-08 NOTE — Assessment & Plan Note (Signed)
Stable.  Cont current meds.  Lytes/cr today.

## 2013-01-11 ENCOUNTER — Telehealth: Payer: Self-pay | Admitting: Cardiology

## 2013-01-11 NOTE — Telephone Encounter (Signed)
New prob  Pt received a letter to sch an appt for an ABI// Pt is unclear why he needs to come in for this test. Please advise.

## 2013-01-11 NOTE — Telephone Encounter (Signed)
Spoke with patient who wants to know what an ABI is and why he has to have this test.  I advised patient regarding the need for this test and that he has not had one in 1 year.  Patient verbalized understanding and asked to be transferred to schedule to make the appointment.

## 2013-01-28 ENCOUNTER — Other Ambulatory Visit (HOSPITAL_COMMUNITY): Payer: Self-pay | Admitting: Physician Assistant

## 2013-01-29 ENCOUNTER — Ambulatory Visit (INDEPENDENT_AMBULATORY_CARE_PROVIDER_SITE_OTHER): Payer: Medicare Other | Admitting: Cardiovascular Disease

## 2013-01-29 ENCOUNTER — Encounter: Payer: Self-pay | Admitting: Cardiovascular Disease

## 2013-01-29 ENCOUNTER — Encounter (INDEPENDENT_AMBULATORY_CARE_PROVIDER_SITE_OTHER): Payer: Medicare Other | Admitting: Cardiology

## 2013-01-29 VITALS — BP 118/70 | HR 55 | Ht 74.0 in | Wt 218.0 lb

## 2013-01-29 DIAGNOSIS — I739 Peripheral vascular disease, unspecified: Secondary | ICD-10-CM

## 2013-01-29 NOTE — Progress Notes (Signed)
HPI  This pleasant 79 year old gentleman who is here today for a follow up visit regarding peripheral arterial disease.He has a history of deep vein thrombosis and bilateral pulmonary emboli 2 years ago for which he has been on Coumadin.  The patient has a past history of known ischemic heart disease. He underwent coronary artery bypass graft surgery in 2000. The surgery was done in [place]. Patient has a long history of hypercholesterolemia and diabetes. He suffers from progressive loss of memory. He also reports previous stroke with significant weakness and unsteady gait. He reports significant limitation in mobility. It appears that she has known history of peripheral arterial disease. Given that his mobility is very limited, he reports no claudication or discomfort but mostly unsteady gait. He denies any lower extremity ulceration or wounds. He had an ABI done last year which was mildly reduced bilaterally. There was evidence of tibial disease bilaterally. Since his evaluation last year, he had recurrent cardiac events requiring cardiac catheterization and angioplasty. Lower extremity symptoms are unchanged.  Allergies  Allergen Reactions  . Doxycycline     swelling  . Morphine And Related     NAUSEA    . Niacin And Related Other (See Comments)    Hot flashes     Current Outpatient Prescriptions on File Prior to Visit  Medication Sig Dispense Refill  . aspirin 81 MG tablet Take 81 mg by mouth daily.      Marland Kitchen atorvastatin (LIPITOR) 80 MG tablet Take 80 mg by mouth daily.      . carvedilol (COREG) 6.25 MG tablet Take 6.25 mg by mouth 2 (two) times daily with a meal.      . diphenhydramine-acetaminophen (TYLENOL PM) 25-500 MG TABS Take 2 tablets by mouth at bedtime.      . fish oil-omega-3 fatty acids 1000 MG capsule Take 2 g by mouth daily.      . hydrALAZINE (APRESOLINE) 25 MG tablet Take 25 mg by mouth 2 (two) times daily.       . insulin glargine (LANTUS) 100 UNIT/ML injection  Inject 25 Units into the skin every morning.       . isosorbide mononitrate (IMDUR) 120 MG 24 hr tablet Take 1 tablet (120 mg total) by mouth daily.  30 tablet  6  . nitroGLYCERIN (NITROSTAT) 0.4 MG SL tablet Place 0.4 mg under the tongue every 5 (five) minutes x 3 doses as needed for chest pain.      Marland Kitchen warfarin (COUMADIN) 5 MG tablet Take 5 mg by mouth every evening. As directed by Dr. Collins Scotland       No current facility-administered medications on file prior to visit.     Past Medical History  Diagnosis Date  . Diabetes mellitus Onset approx 1980  . Hyperlipidemia   . Carotid artery occlusion   . Calf DVT (deep venous thrombosis)     a. hx of right calf - chronic coumadin  . PVD (peripheral vascular disease)     hx of  . Coronary artery disease     a. s/p MI & CABG in 2000;  b. 04/2012 Cath/PTCA:  VG-> RI patent w/ 99 in native RI (PTCA only );  c.  LHC 09/20/12:  oLM 30%, mid LAD occluded, prox D1 80%, mid CFX 100, OM1 30, prox RCA 100, S-RI, native RI 80% (stable), S-OM patent, native OM2 100 (filled via R collats) - culprit, L-LAD patent, S-RCA mid 50%. - med rx; e. 11/2012 NSTEMI->med rx.  Marland Kitchen Hx  pulmonary embolism     a. bilateral;  b. Chronic coumadin  . Stroke 2011    [facility].  . Ischemic cardiomyopathy     a. 04/2012 Echo: EF 40-45%.;  b.  Echo 5/14: EF 60-65%, NL WM, Gr 1 DD  . Hypertension   . Arthritis     Low back and left knee  . CKD (chronic kidney disease)   . Nephrolithiasis      Past Surgical History  Procedure Laterality Date  . Coronary artery bypass graft  2000    4 vessel cabg in [place], [place].  . Cardiac catheterization  04/2012    patent grafts, severe three-vessel CAD, severe diffuse small vessel disease and residual CAD. He underwent angioplasty alone to a 99% ramus intermedius lesion with resultant dissection and 70% restenosis.  . Carotid endarterectomy  2000    bilateral  . Lithotripsy      (Dr. Annabell Howells)  . Cardiac  catheterization  09/2012    Occlusion of the native OM 2 with right-to-left collateral filling, prior balloon angioplasty site unchanged, SVG-RCA was unchanged, mildly elevated LVEDP  . Colonoscopy  2004    Dr. Kizzie Bane in [place], [place].     Family History  Problem Relation Age of Onset  . Stroke Mother     age 12  . Other Father     pulmonary embolus age 28  . Diabetes Sister   . Stroke Sister   . Diabetes Brother     brother was murdered  . Diabetes Sister      History   Social History  . Marital Status: Married    Spouse Name: N/A    Number of Children: N/A  . Years of Education: N/A   Occupational History  . Not on file.   Social History Main Topics  . Smoking status: Never Smoker   . Smokeless tobacco: Never Used  . Alcohol Use: No  . Drug Use: No  . Sexual Activity: Not on file   Other Topics Concern  . Not on file   Social History Narrative   Widowed 2012, 1 daughter and 2 step daughters.   Lives alone in [place].   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   No T/A/Ds.      PHYSICAL EXAM   BP 118/70  Pulse 55  Ht 6\' 2"  (1.88 m)  Wt 218 lb (98.884 kg)  BMI 27.98 kg/m2  SpO2 99% Constitutional: He is oriented to person, place, and time. He appears well-developed and well-nourished. No distress.  HENT: No nasal discharge.  Head: Normocephalic and atraumatic.  Eyes: Pupils are equal and round. Right eye exhibits no discharge. Left eye exhibits no discharge.  Neck: Normal range of motion. Neck supple. No JVD present. No thyromegaly present.  Cardiovascular: Normal rate, regular rhythm, normal heart sounds and. Exam reveals no gallop and no friction rub. No murmur heard.  Pulmonary/Chest: Effort normal and breath sounds normal. No stridor. No respiratory distress. He has no wheezes. He has no rales. He exhibits no tenderness.  Abdominal: Soft. Bowel sounds are normal. He exhibits no  distension. There is no tenderness. There is no rebound and no guarding.  Musculoskeletal: Normal range of motion. He exhibits no edema and no tenderness.  Neurological: He is alert and oriented to person, place, and time. Coordination normal.  Skin: Skin is warm and dry. No rash noted. He is not diaphoretic. No erythema. No pallor.  Psychiatric: He has a normal mood and affect. His behavior is normal. Judgment and thought content normal.  Vascular: Femoral pulses were palpable. DP: +1 bilaterally.       ASSESSMENT AND PLAN

## 2013-01-29 NOTE — Assessment & Plan Note (Signed)
No convincing symptoms of claudication or critical limb ischemia. ABI today was borderline on the right side at 0.9 and normal on the left side at 1.0 which is improved since last year. ABI might be falsely elevated due to medial calcification. However, with lack of convincing symptoms related to PAD, no further workup is needed. He can followup with me as needed.

## 2013-01-29 NOTE — Patient Instructions (Addendum)
Follow up as needed

## 2013-02-07 ENCOUNTER — Encounter: Payer: Self-pay | Admitting: Family Medicine

## 2013-02-07 ENCOUNTER — Ambulatory Visit (INDEPENDENT_AMBULATORY_CARE_PROVIDER_SITE_OTHER): Payer: Medicare Other | Admitting: Family Medicine

## 2013-02-07 VITALS — BP 155/81 | HR 55 | Temp 97.6°F | Resp 16 | Ht 74.0 in | Wt 219.0 lb

## 2013-02-07 DIAGNOSIS — I2699 Other pulmonary embolism without acute cor pulmonale: Secondary | ICD-10-CM

## 2013-02-07 DIAGNOSIS — I82402 Acute embolism and thrombosis of unspecified deep veins of left lower extremity: Secondary | ICD-10-CM

## 2013-02-07 DIAGNOSIS — I82409 Acute embolism and thrombosis of unspecified deep veins of unspecified lower extremity: Secondary | ICD-10-CM

## 2013-02-07 NOTE — Progress Notes (Signed)
OFFICE NOTE  02/07/2013  CC:  Chief Complaint  Patient presents with  . Follow-up    1 month     HPI: Patient is a 79 y.o. Caucasian male who is here for 1 mo f/u DM, HTN, hx of DVT/PE--on chronic coumadin therapy, CAD.   We had planned to discuss his chronic disequilibrium more today.  I did finally get his old records but have not reviewed them completely yet.   Also, he reports IMPROVED constipation on the laxative regimen I rx'd last visit. No new sx's.  Compliant with all meds. Denies blood in urine, stool, or nose bleeds, etc.   Long hx of stable INRs per pt.  He wants to stretch out time between visits as much as possible.  Chronic fatigue, diffuse weakness is his biggest complaint. He denies depression but does say he is very down about his life--has only a few places he is able to go, says he is still getting over his wife's deaath 2 yrs ago.  Denies SI or HI.  He is stoic and declines trial of antidepressant.   Pertinent PMH:  Past Medical History  Diagnosis Date  . Diabetes mellitus Onset approx 1980  . Hyperlipidemia   . Carotid artery occlusion   . Calf DVT (deep venous thrombosis)     a. hx of right calf - chronic coumadin since 12/2008  . PVD (peripheral vascular disease)     ?Tibial dz? Re-eval 01/29/13 by Vascular MD concluded that no significant evidence of PAD was present.  . Coronary artery disease     a. s/p MI & CABG in 2000;  b. 04/2012 Cath/PTCA:  VG-> RI patent w/ 99 in native RI (PTCA only );  c.  LHC 09/20/12:  oLM 30%, mid LAD occluded, prox D1 80%, mid CFX 100, OM1 30, prox RCA 100, S-RI, native RI 80% (stable), S-OM patent, native OM2 100 (filled via R collats) - culprit, L-LAD patent, S-RCA mid 50%. - med rx; e. 11/2012 NSTEMI->med rx.  Marland Kitchen Hx pulmonary embolism     a. bilateral;  b. Chronic coumadin since 12/2008  . Stroke 2010    [facility].  . Ischemic cardiomyopathy     a. 04/2012 Echo: EF 40-45%.;  b.  Echo 5/14: EF 60-65%, NL  WM, Gr 1 DD  . Hypertension   . Arthritis     Low back and left knee  . CKD (chronic kidney disease)     left sided chronic high grade obstruction on NM renal imaging (09/2011)--9% function compared to 90% function on right side.  . Nephrolithiasis     with left ureterolithiasis; hx of ureteral stent on left--has resulted in markedly decreased function of left kidney.  . Diabetic retinopathy associated with type 2 diabetes mellitus   . Squamous cell carcinoma 2008    on his back   Past surgical, social, and family history reviewed and no changes noted since last office visit.  MEDS:  Outpatient Prescriptions Prior to Visit  Medication Sig Dispense Refill  . aspirin 81 MG tablet Take 81 mg by mouth daily.      Marland Kitchen atorvastatin (LIPITOR) 80 MG tablet Take 80 mg by mouth daily.      . carvedilol (COREG) 6.25 MG tablet Take 6.25 mg by mouth 2 (two) times daily with a meal.      . diphenhydramine-acetaminophen (TYLENOL PM) 25-500 MG TABS Take 2 tablets by mouth at bedtime.      . fish oil-omega-3 fatty acids  1000 MG capsule Take 2 g by mouth daily.      . hydrALAZINE (APRESOLINE) 25 MG tablet Take 25 mg by mouth 2 (two) times daily.       . insulin glargine (LANTUS) 100 UNIT/ML injection Inject 25 Units into the skin every morning.       . isosorbide mononitrate (IMDUR) 120 MG 24 hr tablet Take 1 tablet (120 mg total) by mouth daily.  30 tablet  6  . nitroGLYCERIN (NITROSTAT) 0.4 MG SL tablet Place 0.4 mg under the tongue every 5 (five) minutes x 3 doses as needed for chest pain.      Marland Kitchen warfarin (COUMADIN) 5 MG tablet Take 5 mg by mouth every evening. As directed by Dr. Collins Scotland       No facility-administered medications prior to visit.    PE: Blood pressure 155/81, pulse 55, temperature 97.6 F (36.4 C), temperature source Temporal, resp. rate 16, height 6\' 2"  (1.88 m), weight 219 lb (99.338 kg), SpO2 97.00%. Gen: Alert, well appearing.  Patient is oriented to person, place, time, and  situation. He is not able to get himself onto the exam table due to legs feeling weak and a feeling of being off balance. Leg strength testing: 5-/5 bilat in prox and dist muscles bilat. Lower legs with 1-2+ pitting edema bilat  LABS: none today Lab Results  Component Value Date   HGBA1C 6.7* 01/08/2013     Chemistry      Component Value Date/Time   NA 140 01/08/2013 0900   K 3.6 01/08/2013 0900   CL 109 01/08/2013 0900   CO2 23 01/08/2013 0900   BUN 15 01/08/2013 0900   CREATININE 1.3 01/08/2013 0900      Component Value Date/Time   CALCIUM 8.8 01/08/2013 0900   ALKPHOS 58 11/13/2012 0910   AST 13 11/13/2012 0910   ALT 12 11/13/2012 0910   BILITOT 0.7 11/13/2012 0910       IMPRESSION AND PLAN:  1) Remote right calf DVT and PE's; on chronic coumadin: check PT/INR today.  If 3 INRs in a row are therapeutic then I agreed to space INR checks out to q 2 mo.    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  He has residual balance/disequilibrium problems. He has been through multiple rounds of PT and it has not helped.  At this point he is unstable but denies having falls. If he starts to have falls we'll likely have to d/c his coumadin. Still awaiting records from [facility].  3) CAD: stable, continue medical regimen of beta blocker, ASA, statin, and nitrates.  4) DM 2, stable/good control.  Continue current insulin regimen.  An After Visit Summary was printed and given to the patient.   FOLLOW UP: 6 mo

## 2013-02-10 ENCOUNTER — Encounter: Payer: Self-pay | Admitting: Family Medicine

## 2013-02-17 ENCOUNTER — Other Ambulatory Visit (HOSPITAL_COMMUNITY): Payer: Self-pay | Admitting: Physician Assistant

## 2013-02-20 ENCOUNTER — Other Ambulatory Visit (INDEPENDENT_AMBULATORY_CARE_PROVIDER_SITE_OTHER): Payer: Medicare Other

## 2013-02-20 DIAGNOSIS — I82402 Acute embolism and thrombosis of unspecified deep veins of left lower extremity: Secondary | ICD-10-CM

## 2013-02-20 DIAGNOSIS — I82409 Acute embolism and thrombosis of unspecified deep veins of unspecified lower extremity: Secondary | ICD-10-CM

## 2013-02-20 LAB — PROTIME-INR: Prothrombin Time: 12.9 s — ABNORMAL HIGH (ref 10.2–12.4)

## 2013-02-20 NOTE — Progress Notes (Signed)
Labs only

## 2013-02-21 ENCOUNTER — Other Ambulatory Visit: Payer: Self-pay | Admitting: Family Medicine

## 2013-02-21 MED ORDER — WARFARIN SODIUM 5 MG PO TABS: 5.0000 mg | ORAL_TABLET | Freq: Every evening | ORAL | Status: DC

## 2013-03-06 ENCOUNTER — Encounter: Payer: Self-pay | Admitting: Family Medicine

## 2013-03-06 ENCOUNTER — Ambulatory Visit (INDEPENDENT_AMBULATORY_CARE_PROVIDER_SITE_OTHER): Payer: Medicare Other | Admitting: Family Medicine

## 2013-03-06 VITALS — BP 141/76 | HR 56 | Temp 98.0°F | Resp 18 | Ht 74.0 in | Wt 218.0 lb

## 2013-03-06 DIAGNOSIS — Z7901 Long term (current) use of anticoagulants: Secondary | ICD-10-CM

## 2013-03-06 DIAGNOSIS — Z79899 Other long term (current) drug therapy: Secondary | ICD-10-CM

## 2013-03-06 DIAGNOSIS — E119 Type 2 diabetes mellitus without complications: Secondary | ICD-10-CM

## 2013-03-06 DIAGNOSIS — Z86711 Personal history of pulmonary embolism: Secondary | ICD-10-CM

## 2013-03-06 LAB — PROTIME-INR: INR: 2.5 ratio — ABNORMAL HIGH (ref 0.8–1.0)

## 2013-03-06 NOTE — Progress Notes (Signed)
OFFICE NOTE  03/06/2013  CC:  Chief Complaint  Patient presents with  . Follow-up     HPI: Patient is a 79 y.o. Caucasian male who is here for f/u coumadin for hx of PE/DVT. Also hx of CVA with residual balance/ataxia problems.  Has had lots of PT for balance and strength assistance in the past, most recently about 10 mo ago. Says none of this has really helped any.  He is limited by arthritis of back and knees. In the last month he has fallen once, caught foot on carpet when carrying a cup in one hand and plate of food in other hand--essentially not being as careful as he usually is--- and fell to hands and knees--no trauma to anywhere but had to be helped up. He lives alone (for the last 2 yrs).  He drives.   He has no new complaints.  He does not want to do any further PT.  He denies feeling like his glucose is up or down lately. Last HbA1c was <7% about 2 mo ago.   Pertinent PMH:  Past Medical History  Diagnosis Date  . Diabetes mellitus Onset approx 1980  . Hyperlipidemia   . Carotid artery occlusion   . Calf DVT (deep venous thrombosis) 2010    a. hx of right calf - chronic coumadin since 12/2008  . PVD (peripheral vascular disease)     ?Tibial dz? Re-eval 01/29/13 by Vascular MD concluded that no significant evidence of PAD was present.  . Coronary artery disease     a. s/p MI & CABG in 2000;  b. 04/2012 Cath/PTCA:  VG-> RI patent w/ 99 in native RI (PTCA only );  c.  LHC 09/20/12:  oLM 30%, mid LAD occluded, prox D1 80%, mid CFX 100, OM1 30, prox RCA 100, S-RI, native RI 80% (stable), S-OM patent, native OM2 100 (filled via R collats) - culprit, L-LAD patent, S-RCA mid 50%. - med rx; e. 11/2012 NSTEMI->med rx.  Marland Kitchen Hx pulmonary embolism 2010    a. bilateral;  b. Chronic coumadin since 12/2008  . Stroke 2010    [facility].  . Ischemic cardiomyopathy     a. 04/2012 Echo: EF 40-45%.;  b.  Echo 5/14: EF 60-65%, NL WM, Gr 1 DD  . Hypertension   . Osteoarthritis      Low back and left knee and both hips  . CKD (chronic kidney disease)     left sided chronic high grade obstruction on NM renal imaging (09/2011)--9% function compared to 90% function on right side.  . Nephrolithiasis     with left ureterolithiasis; hx of ureteral stent on left--has resulted in markedly decreased function of left kidney.  . Diabetic retinopathy associated with type 2 diabetes mellitus   . Squamous cell carcinoma 2008    on his back  . Insomnia   . History of pneumonia   . Diabetic peripheral neuropathy     was on neurontin at one time    MEDS:  Outpatient Prescriptions Prior to Visit  Medication Sig Dispense Refill  . aspirin 81 MG tablet Take 81 mg by mouth daily.      Marland Kitchen atorvastatin (LIPITOR) 80 MG tablet Take 80 mg by mouth daily.      . carvedilol (COREG) 6.25 MG tablet Take 6.25 mg by mouth 2 (two) times daily with a meal.      . diphenhydramine-acetaminophen (TYLENOL PM) 25-500 MG TABS Take 2 tablets by mouth at bedtime.      Marland Kitchen  fish oil-omega-3 fatty acids 1000 MG capsule Take 2 g by mouth daily.      . hydrALAZINE (APRESOLINE) 25 MG tablet Take 1 tablet (25 mg total) by mouth 2 (two) times daily.  60 tablet  3  . insulin glargine (LANTUS) 100 UNIT/ML injection Inject 25 Units into the skin every morning.       . isosorbide mononitrate (IMDUR) 120 MG 24 hr tablet Take 1 tablet (120 mg total) by mouth daily.  30 tablet  6  . nitroGLYCERIN (NITROSTAT) 0.4 MG SL tablet Place 0.4 mg under the tongue every 5 (five) minutes x 3 doses as needed for chest pain.      Marland Kitchen warfarin (COUMADIN) 5 MG tablet Take 1 tablet (5 mg total) by mouth every evening. As directed by Dr. Collins Scotland  120 tablet  1   No facility-administered medications prior to visit.    PE: Blood pressure 141/76, pulse 56, temperature 98 F (36.7 C), temperature source Temporal, resp. rate 18, height 6\' 2"  (1.88 m), weight 218 lb (98.884 kg), SpO2 97.00%. Gen: alert, hard of hearing, pleasant, oriented x  4, well appearing. Uses arm and cane to help himself up out of chair. CV: RRR, distant S1 and S2 Chest is clear, no wheezing or rales. Normal symmetric air entry throughout both lung fields. No chest wall deformities or tenderness. EXT: 1+  bilat pitting in ankles/feet, trace pitting above this up to mid tibia bilat  IMPRESSION AND PLAN:  History of pulmonary embolism Last PT/INR low, after having stable/therapeutic INRs for a while prior to that.  Discussed vit K constancy in diet. Recheck PT/INR today. We have been having ongoing dialogue about benefits/risk of coumadin for him.   At this time we both agree with continuing this med BUT I will research his chart and see if he has had recurrent thrombotic events. If isolated event, I'll re-discuss getting him off of coumadin since he has been on this for a few years now.  Type II or unspecified type diabetes mellitus without mention of complication, not stated as uncontrolled He will not monitor his glucoses. Lab Results  Component Value Date   HGBA1C 6.7* 01/08/2013   Over the next couple of o/v's we'll need to get urine microalb/cr and do foot exam.   When pt returns for next PT/INR we'll need to get him a flu vaccine.  Spent 25 min with pt today, with >50% of this time spent in counseling and care coordination regarding the above problems.  FOLLOW UP: 2 mo o/v

## 2013-03-06 NOTE — Assessment & Plan Note (Signed)
Last PT/INR low, after having stable/therapeutic INRs for a while prior to that.  Discussed vit K constancy in diet. Recheck PT/INR today. We have been having ongoing dialogue about benefits/risk of coumadin for him.   At this time we both agree with continuing this med BUT I will research his chart and see if he has had recurrent thrombotic events. If isolated event, I'll re-discuss getting him off of coumadin since he has been on this for a few years now.

## 2013-03-06 NOTE — Assessment & Plan Note (Signed)
He will not monitor his glucoses. Lab Results  Component Value Date   HGBA1C 6.7* 01/08/2013   Over the next couple of o/v's we'll need to get urine microalb/cr and do foot exam.

## 2013-03-11 ENCOUNTER — Ambulatory Visit: Payer: Medicare Other | Admitting: Family Medicine

## 2013-03-13 ENCOUNTER — Ambulatory Visit (INDEPENDENT_AMBULATORY_CARE_PROVIDER_SITE_OTHER): Payer: Medicare Other | Admitting: Cardiology

## 2013-03-13 ENCOUNTER — Encounter: Payer: Self-pay | Admitting: Cardiology

## 2013-03-13 ENCOUNTER — Encounter (INDEPENDENT_AMBULATORY_CARE_PROVIDER_SITE_OTHER): Payer: Self-pay

## 2013-03-13 VITALS — BP 133/71 | HR 79 | Ht 74.0 in | Wt 220.0 lb

## 2013-03-13 DIAGNOSIS — I214 Non-ST elevation (NSTEMI) myocardial infarction: Secondary | ICD-10-CM

## 2013-03-13 DIAGNOSIS — I824Z9 Acute embolism and thrombosis of unspecified deep veins of unspecified distal lower extremity: Secondary | ICD-10-CM

## 2013-03-13 DIAGNOSIS — I10 Essential (primary) hypertension: Secondary | ICD-10-CM

## 2013-03-13 DIAGNOSIS — E7800 Pure hypercholesterolemia, unspecified: Secondary | ICD-10-CM

## 2013-03-13 DIAGNOSIS — I259 Chronic ischemic heart disease, unspecified: Secondary | ICD-10-CM

## 2013-03-13 DIAGNOSIS — Z86711 Personal history of pulmonary embolism: Secondary | ICD-10-CM

## 2013-03-13 NOTE — Patient Instructions (Signed)
STOP YOUR WARFARIN  CONTINUE ASPIRIN 81 MG DAILY  Your physician wants you to follow-up in: 4 MONTHS You will receive a reminder letter in the mail two months in advance. If you don't receive a letter, please call our office to schedule the follow-up appointment.

## 2013-03-13 NOTE — Assessment & Plan Note (Signed)
Blood pressure was remaining controlled on current therapy.  His new PCP is Dr. Milinda Cave.  The patient has not been having any symptoms of congestive heart failure or any awareness of palpitation

## 2013-03-13 NOTE — Assessment & Plan Note (Signed)
The patient has not been experiencing any recurrent chest pain or angina. 

## 2013-03-13 NOTE — Assessment & Plan Note (Signed)
The patient had a single episode of calf DVT 2-1/2 years ago complicated by pulmonary embolus.  He had never had prior DVT.  The DVT was unprovoked.  He does not have any family history of clotting or coagulopathy.  He is anxious to come off of the warfarin.  We can stop his warfarin now and we will continue aspirin 81 mg daily indefinitely.  We talked about how if he in the future ever develops anything to suggest recurrent deep vein thrombosis of the legs that he is to let his physicians know immediately and this would also mean that he would need to go back on long-term anticoagulation.

## 2013-03-13 NOTE — Progress Notes (Signed)
Wesley [name] Date of Birth:  1931/09/06 [facility] [address] [place], [place]  [address] [phone]         Fax   [phone]  History of Present Illness: Wesley [name] is a 79 y.o. male who returns for follow up.  He was admitted  to the hospital 4/15-4/18 with a non-STEMI. He has a hx of CAD, status post MI followed by CABG in 2000, angioplasty to the native ramus intermediate 04/2012, ischemic cardiomyopathy, DM2, HL, CKD, carotid stenosis, prior stroke, HTN, PAD, prior DVT/bilateral pulmonary emboli.  For the past 2-1/2 years he has been on chronic Coumadin. Patient was admitted in 04/2012 with unstable angina. He had an angioplasty of the ramus intermediate with resultant dissection and 70% restenosis. Echo 04/2012: EF 40-45%. He was readmitted 4/15 with complaints of sudden onset left-sided chest pressure. LHC 09/20/12: oLM 30%, mid LAD occluded, prox D1 80%, mid CFX occluded, OM1 30%, proximal RCA occluded, SVG-ramus patent, native ramus 80% (stable from previous study), SVG-OM patent, native OM2 occluded (filled via right collaterals), LIMA-LAD patent, SVG-RCA mid 50%. Culprit for non-STEMI was a newly occluded OM2 with R->L collaterals. Otherwise anatomy was stable and medical therapy was recommended. He is not on ACE due to CKD. He has been placed on hydralazine and nitrates. His Pravastatin was changed to atorvastatin. F/u echo  demonstrates recovered LVF (EF 60-65%). He is doing well since d/c. No further chest pain. Has poor balance and decreased energy since his prior stroke. Denies significant dyspnea. No orthopnea, PND, edema. No syncope.  He was seen by Dr. Kirke Corin in August 2014 who felt that the patient did not have any significant peripheral arterial occlusive disease.  His ankle-brachial indices were almost normal.  No further evaluation was indicated and the patient feels that his leg weakness probably is a result of his old stroke.  He walks with a  cane.   Current Outpatient Prescriptions  Medication Sig Dispense Refill  . aspirin 81 MG tablet Take 81 mg by mouth daily.      Marland Kitchen atorvastatin (LIPITOR) 80 MG tablet Take 80 mg by mouth daily.      . carvedilol (COREG) 6.25 MG tablet Take 6.25 mg by mouth 2 (two) times daily with a meal.      . diphenhydramine-acetaminophen (TYLENOL PM) 25-500 MG TABS Take 2 tablets by mouth at bedtime.      . fish oil-omega-3 fatty acids 1000 MG capsule Take 2 g by mouth daily.      . hydrALAZINE (APRESOLINE) 25 MG tablet Take 1 tablet (25 mg total) by mouth 2 (two) times daily.  60 tablet  3  . insulin glargine (LANTUS) 100 UNIT/ML injection Inject 25 Units into the skin every morning.       . isosorbide mononitrate (IMDUR) 120 MG 24 hr tablet Take 1 tablet (120 mg total) by mouth daily.  30 tablet  6  . nitroGLYCERIN (NITROSTAT) 0.4 MG SL tablet Place 0.4 mg under the tongue every 5 (five) minutes x 3 doses as needed for chest pain.       No current facility-administered medications for this visit.    Allergies  Allergen Reactions  . Doxycycline     swelling  . Morphine And Related     NAUSEA    . Niacin And Related Other (See Comments)    Hot flashes    Patient Active Problem List   Diagnosis Date Noted  . Type II or unspecified type diabetes mellitus  without mention of complication, not stated as uncontrolled 01/08/2013  . Chronic renal insufficiency 01/08/2013  . History of pulmonary embolism 01/08/2013  . History of DVT (deep vein thrombosis) 01/08/2013  . Constipation, acute 01/07/2013  . NSTEMI (non-ST elevated myocardial infarction) 09/21/2012  . Dyslipidemia 09/21/2012  . Elevated TSH 09/21/2012  . Angina pectoris, unstable 04/29/2012  . Diabetes mellitus 04/29/2012  . History of CVA (cerebrovascular accident) 04/29/2012  . CKD (chronic kidney disease), stage III 04/29/2012  . Hypertension, uncontrolled 04/29/2012  . Kidney stone 02/28/2011  . Calf DVT (deep venous thrombosis)    . PVD (peripheral vascular disease)   . Coronary artery disease   . Hx pulmonary embolism     History  Smoking status  . Never Smoker   Smokeless tobacco  . Never Used    History  Alcohol Use No    Family History  Problem Relation Age of Onset  . Stroke Mother     age 12  . Other Father     pulmonary embolus age 6  . Diabetes Sister   . Stroke Sister   . Diabetes Brother     brother was murdered  . Diabetes Sister     Review of Systems: Constitutional: no fever chills diaphoresis or fatigue or change in weight.  Head and neck: no hearing loss, no epistaxis, no photophobia or visual disturbance. Respiratory: No cough, shortness of breath or wheezing. Cardiovascular: No chest pain peripheral edema, palpitations. Gastrointestinal: No abdominal distention, no abdominal pain, no change in bowel habits hematochezia or melena. Genitourinary: No dysuria, no frequency, no urgency, no nocturia. Musculoskeletal:No arthralgias, no back pain, no gait disturbance or myalgias. Neurological: No dizziness, no headaches, no numbness, no seizures, no syncope, no weakness, no tremors. Hematologic: No lymphadenopathy, no easy bruising. Psychiatric: No confusion, no hallucinations, no sleep disturbance.    Physical Exam: Filed Vitals:   03/13/13 1006  BP: 133/71  Pulse: 79   the general appearance reveals a well-developed well-nourished gentleman in no distress.The head and neck exam reveals pupils equal and reactive.  Extraocular movements are full.  There is no scleral icterus.  The mouth and pharynx are normal.  The neck is supple.  The carotids reveal no bruits.  The jugular venous pressure is normal.  The  thyroid is not enlarged.  There is no lymphadenopathy.  The chest is clear to percussion and auscultation.  There are no rales or rhonchi.  Expansion of the chest is symmetrical.  The precordium is quiet.  The first heart sound is normal.  The second heart sound is physiologically  split.  There is no murmur gallop rub or click.  There is no abnormal lift or heave.  The abdomen is soft and nontender.  The bowel sounds are normal.  The liver and spleen are not enlarged.  There are no abdominal masses.  There are no abdominal bruits.  Extremities reveal good pedal pulses.  There is no phlebitis or edema.  There is no cyanosis or clubbing.  Strength is normal and symmetrical in all extremities.  There is no lateralizing weakness.  There are no sensory deficits.  The skin is warm and dry.  There is no rash.  EKG today shows sinus bradycardia with first degree AV block and old inferior wall myocardial infarction.  Since 11/19/12, no significant change.   Assessment / Plan: The patient appears to be doing well .  At his request we will stop his warfarin.  He will continue long-term aspirin. Recheck  here in 6 months for followup office visit.

## 2013-03-14 ENCOUNTER — Telehealth: Payer: Self-pay | Admitting: Family Medicine

## 2013-03-14 ENCOUNTER — Ambulatory Visit (INDEPENDENT_AMBULATORY_CARE_PROVIDER_SITE_OTHER): Payer: Medicare Other

## 2013-03-14 DIAGNOSIS — Z23 Encounter for immunization: Secondary | ICD-10-CM

## 2013-03-14 NOTE — Telephone Encounter (Signed)
He can actually wait until February 2015 to f/u--make it a 30 min appt for f/u chronic illnesses.-thx

## 2013-03-14 NOTE — Telephone Encounter (Signed)
Patient was taken off of coumadin yesterday by Dr. Patty Sermons. Can the patient move his 05/07/13 visit to January?

## 2013-03-14 NOTE — Telephone Encounter (Signed)
Please advise 

## 2013-03-21 ENCOUNTER — Other Ambulatory Visit: Payer: Medicare Other

## 2013-03-21 ENCOUNTER — Ambulatory Visit: Payer: Medicare Other

## 2013-04-17 ENCOUNTER — Emergency Department (HOSPITAL_BASED_OUTPATIENT_CLINIC_OR_DEPARTMENT_OTHER)
Admission: EM | Admit: 2013-04-17 | Discharge: 2013-04-17 | Disposition: A | Discharge status: Home / Self Care | Payer: Medicare Other | Attending: Emergency Medicine | Admitting: Emergency Medicine

## 2013-04-17 ENCOUNTER — Ambulatory Visit (HOSPITAL_BASED_OUTPATIENT_CLINIC_OR_DEPARTMENT_OTHER)
Admission: RE | Admit: 2013-04-17 | Discharge: 2013-04-17 | Disposition: A | Discharge status: Home / Self Care | Payer: Medicare Other | Source: Ambulatory Visit | Attending: Family Medicine | Admitting: Family Medicine

## 2013-04-17 ENCOUNTER — Other Ambulatory Visit: Payer: Self-pay | Admitting: Family Medicine

## 2013-04-17 ENCOUNTER — Encounter: Payer: Self-pay | Admitting: Family Medicine

## 2013-04-17 ENCOUNTER — Encounter (HOSPITAL_BASED_OUTPATIENT_CLINIC_OR_DEPARTMENT_OTHER): Payer: Self-pay | Admitting: Emergency Medicine

## 2013-04-17 ENCOUNTER — Ambulatory Visit (INDEPENDENT_AMBULATORY_CARE_PROVIDER_SITE_OTHER): Payer: Medicare Other | Admitting: Family Medicine

## 2013-04-17 VITALS — BP 109/65 | HR 60 | Temp 98.5°F | Resp 20 | Ht 74.0 in | Wt 212.0 lb

## 2013-04-17 DIAGNOSIS — G609 Hereditary and idiopathic neuropathy, unspecified: Secondary | ICD-10-CM | POA: Insufficient documentation

## 2013-04-17 DIAGNOSIS — E11319 Type 2 diabetes mellitus with unspecified diabetic retinopathy without macular edema: Secondary | ICD-10-CM | POA: Insufficient documentation

## 2013-04-17 DIAGNOSIS — M1990 Unspecified osteoarthritis, unspecified site: Secondary | ICD-10-CM | POA: Insufficient documentation

## 2013-04-17 DIAGNOSIS — I82401 Acute embolism and thrombosis of unspecified deep veins of right lower extremity: Secondary | ICD-10-CM

## 2013-04-17 DIAGNOSIS — Z8701 Personal history of pneumonia (recurrent): Secondary | ICD-10-CM | POA: Insufficient documentation

## 2013-04-17 DIAGNOSIS — E1139 Type 2 diabetes mellitus with other diabetic ophthalmic complication: Secondary | ICD-10-CM | POA: Insufficient documentation

## 2013-04-17 DIAGNOSIS — M7989 Other specified soft tissue disorders: Secondary | ICD-10-CM

## 2013-04-17 DIAGNOSIS — Z9861 Coronary angioplasty status: Secondary | ICD-10-CM | POA: Insufficient documentation

## 2013-04-17 DIAGNOSIS — Z7901 Long term (current) use of anticoagulants: Secondary | ICD-10-CM | POA: Insufficient documentation

## 2013-04-17 DIAGNOSIS — M1610 Unilateral primary osteoarthritis, unspecified hip: Secondary | ICD-10-CM | POA: Insufficient documentation

## 2013-04-17 DIAGNOSIS — Z87442 Personal history of urinary calculi: Secondary | ICD-10-CM | POA: Insufficient documentation

## 2013-04-17 DIAGNOSIS — Z86711 Personal history of pulmonary embolism: Secondary | ICD-10-CM | POA: Insufficient documentation

## 2013-04-17 DIAGNOSIS — I2510 Atherosclerotic heart disease of native coronary artery without angina pectoris: Secondary | ICD-10-CM | POA: Insufficient documentation

## 2013-04-17 DIAGNOSIS — E785 Hyperlipidemia, unspecified: Secondary | ICD-10-CM | POA: Insufficient documentation

## 2013-04-17 DIAGNOSIS — Z79899 Other long term (current) drug therapy: Secondary | ICD-10-CM | POA: Insufficient documentation

## 2013-04-17 DIAGNOSIS — Z951 Presence of aortocoronary bypass graft: Secondary | ICD-10-CM | POA: Insufficient documentation

## 2013-04-17 DIAGNOSIS — Z794 Long term (current) use of insulin: Secondary | ICD-10-CM | POA: Insufficient documentation

## 2013-04-17 DIAGNOSIS — M1710 Unilateral primary osteoarthritis, unspecified knee: Secondary | ICD-10-CM | POA: Insufficient documentation

## 2013-04-17 DIAGNOSIS — I252 Old myocardial infarction: Secondary | ICD-10-CM | POA: Insufficient documentation

## 2013-04-17 DIAGNOSIS — IMO0002 Reserved for concepts with insufficient information to code with codable children: Secondary | ICD-10-CM | POA: Insufficient documentation

## 2013-04-17 DIAGNOSIS — Z86718 Personal history of other venous thrombosis and embolism: Secondary | ICD-10-CM

## 2013-04-17 DIAGNOSIS — M169 Osteoarthritis of hip, unspecified: Secondary | ICD-10-CM | POA: Insufficient documentation

## 2013-04-17 DIAGNOSIS — Z8673 Personal history of transient ischemic attack (TIA), and cerebral infarction without residual deficits: Secondary | ICD-10-CM | POA: Insufficient documentation

## 2013-04-17 DIAGNOSIS — Z85828 Personal history of other malignant neoplasm of skin: Secondary | ICD-10-CM | POA: Insufficient documentation

## 2013-04-17 DIAGNOSIS — I824Y9 Acute embolism and thrombosis of unspecified deep veins of unspecified proximal lower extremity: Secondary | ICD-10-CM | POA: Insufficient documentation

## 2013-04-17 DIAGNOSIS — E1149 Type 2 diabetes mellitus with other diabetic neurological complication: Secondary | ICD-10-CM | POA: Insufficient documentation

## 2013-04-17 DIAGNOSIS — I129 Hypertensive chronic kidney disease with stage 1 through stage 4 chronic kidney disease, or unspecified chronic kidney disease: Secondary | ICD-10-CM | POA: Insufficient documentation

## 2013-04-17 DIAGNOSIS — N189 Chronic kidney disease, unspecified: Secondary | ICD-10-CM | POA: Insufficient documentation

## 2013-04-17 DIAGNOSIS — Z7982 Long term (current) use of aspirin: Secondary | ICD-10-CM | POA: Insufficient documentation

## 2013-04-17 MED ORDER — ENOXAPARIN SODIUM 100 MG/ML ~~LOC~~ SOLN
1.0000 mg/kg | Freq: Once | SUBCUTANEOUS | Status: AC
  Administered 2013-04-17: 95 mg via SUBCUTANEOUS
  Filled 2013-04-17: qty 1

## 2013-04-17 MED ORDER — ENOXAPARIN SODIUM 100 MG/ML ~~LOC~~ SOLN: SUBCUTANEOUS | Status: DC

## 2013-04-17 NOTE — ED Provider Notes (Signed)
CSN: [id number]     Arrival date & time 04/17/13  2130 History  This chart was scribed for Wesley B. Bernette Mayers, MD by Bennett Scrape, ED Scribe. This patient was seen in room MH08/MH08 and the patient's care was started at 9:56 PM.   Chief Complaint  Patient presents with  . DVT    The history is provided by the patient. No language interpreter was used.    HPI Comments: Wesley [name] is a 79 y.o. male with a h/o DVT who presents to the Emergency Department complaining of right lower leg pain with associated swelling. He was seen at his PCP's office today and had an Korea that showed another DVT. He was sent here for an Lovenox injection to bridge him until tomorrow morning. He has a Coumadin prescription that he can pick up tomorrow. He denies any CP or SOB.   Past Medical History  Diagnosis Date  . Diabetes mellitus Onset approx 1980  . Hyperlipidemia   . Carotid artery occlusion   . Calf DVT (deep venous thrombosis) 2010    a. hx of right calf - chronic coumadin since 12/2008  . PVD (peripheral vascular disease)     ?Tibial dz? Re-eval 01/29/13 by Vascular MD concluded that no significant evidence of PAD was present.  . Coronary artery disease     a. s/p MI & CABG in 2000;  b. 04/2012 Cath/PTCA:  VG-> RI patent w/ 99 in native RI (PTCA only );  c.  LHC 09/20/12:  oLM 30%, mid LAD occluded, prox D1 80%, mid CFX 100, OM1 30, prox RCA 100, S-RI, native RI 80% (stable), S-OM patent, native OM2 100 (filled via R collats) - culprit, L-LAD patent, S-RCA mid 50%. - med rx; e. 11/2012 NSTEMI->med rx.  Marland Kitchen Hx pulmonary embolism 2010    a. bilateral;  b. Chronic coumadin since 12/2008  . Stroke 2010    [facility].  . Ischemic cardiomyopathy     a. 04/2012 Echo: EF 40-45%.;  b.  Echo 5/14: EF 60-65%, NL WM, Gr 1 DD  . Hypertension   . Osteoarthritis     Low back and left knee and both hips  . CKD (chronic kidney disease)     left sided chronic high grade obstruction on NM renal  imaging (09/2011)--9% function compared to 90% function on right side.  . Nephrolithiasis     with left ureterolithiasis; hx of ureteral stent on left--has resulted in markedly decreased function of left kidney.  . Diabetic retinopathy associated with type 2 diabetes mellitus   . Squamous cell carcinoma 2008    on his back  . Insomnia   . History of pneumonia   . Diabetic peripheral neuropathy     was on neurontin at one time   Past Surgical History  Procedure Laterality Date  . Coronary artery bypass graft  2000    4 vessel cabg in [place], [place].  . Cardiac catheterization  04/2012    patent grafts, severe three-vessel CAD, severe diffuse small vessel disease and residual CAD. He underwent angioplasty alone to a 99% ramus intermedius lesion with resultant dissection and 70% restenosis.  . Carotid endarterectomy  2000    bilateral  . Lithotripsy      (Dr. Annabell Howells)  . Cardiac catheterization  09/2012    Occlusion of the native OM 2 with right-to-left collateral filling, prior balloon angioplasty site unchanged, SVG-RCA was unchanged, mildly elevated LVEDP  . Colonoscopy  2004    Dr. Kizzie Bane  in [place], [place].   Family History  Problem Relation Age of Onset  . Stroke Mother     age 6  . Other Father     pulmonary embolus age 62  . Diabetes Sister   . Stroke Sister   . Diabetes Brother     brother was murdered  . Diabetes Sister    History  Substance Use Topics  . Smoking status: Never Smoker   . Smokeless tobacco: Never Used  . Alcohol Use: No    Review of Systems  A complete 10 system review of systems was obtained and all systems are negative except as noted in the HPI and PMH.   Allergies  Doxycycline; Morphine and related; and Niacin and related  Home Medications   Current Outpatient Rx  Name  Route  Sig  Dispense  Refill  . aspirin 81 MG tablet   Oral   Take 81 mg by mouth daily.         Marland Kitchen atorvastatin (LIPITOR) 80 MG tablet   Oral   Take 80 mg by  mouth daily.         . carvedilol (COREG) 6.25 MG tablet   Oral   Take 6.25 mg by mouth 2 (two) times daily with a meal.         . diphenhydramine-acetaminophen (TYLENOL PM) 25-500 MG TABS   Oral   Take 2 tablets by mouth at bedtime.         . fish oil-omega-3 fatty acids 1000 MG capsule   Oral   Take 2 g by mouth daily.         . hydrALAZINE (APRESOLINE) 25 MG tablet   Oral   Take 1 tablet (25 mg total) by mouth 2 (two) times daily.   60 tablet   3     Per Charlotte Sanes last note continue this medicati ...   . insulin glargine (LANTUS) 100 UNIT/ML injection   Subcutaneous   Inject 25 Units into the skin every morning.          . isosorbide mononitrate (IMDUR) 120 MG 24 hr tablet   Oral   Take 1 tablet (120 mg total) by mouth daily.   30 tablet   6   . nitroGLYCERIN (NITROSTAT) 0.4 MG SL tablet   Sublingual   Place 0.4 mg under the tongue every 5 (five) minutes x 3 doses as needed for chest pain.          Triage Vitals: BP 163/76  Pulse 64  Temp(Src) 98.5 F (36.9 C) (Oral)  Resp 16  Ht 6\' 2"  (1.88 m)  Wt 212 lb (96.163 kg)  BMI 27.21 kg/m2  SpO2 100%  Physical Exam  Nursing note and vitals reviewed. Constitutional: He is oriented to person, place, and time. He appears well-developed and well-nourished.  HENT:  Head: Normocephalic and atraumatic.  Eyes: EOM are normal. Pupils are equal, round, and reactive to light.  Neck: Normal range of motion. Neck supple.  Cardiovascular: Normal rate, normal heart sounds and intact distal pulses.   Pulmonary/Chest: Effort normal and breath sounds normal.  Abdominal: Bowel sounds are normal. He exhibits no distension. There is no tenderness.  Musculoskeletal: Normal range of motion. He exhibits edema and tenderness.  Swelling and tenderness to the right lower extremity  Neurological: He is alert and oriented to person, place, and time. He has normal strength. No cranial nerve deficit or sensory deficit.   Skin: Skin is warm and dry. No rash  noted.  Psychiatric: He has a normal mood and affect.    ED Course  Procedures (including critical care time)  Medications  enoxaparin (LOVENOX) injection 95 mg (95 mg Subcutaneous Given 04/17/13 2144)    DIAGNOSTIC STUDIES: Oxygen Saturation is 100% on room air, normal by my interpretation.    COORDINATION OF CARE: 9:57 PM-Pt administered a Lovenox injection. Discussed discharge plan which includes getting Coumadin prescription filled tomorrow with pt and pt agreed to plan. Also advised pt to follow up as needed and pt agreed. Addressed symptoms to return for with pt.   Labs Review Labs Reviewed - No data to display Imaging Review US Venous Img Lower Unilateral Right  04/17/2013   CLINICAL DATA:  New right calf swelling, pain and erythema. History of right lower extremity DVT in the past.  EXAM: RIGHT LOWER EXTREMITY VENOUS ULTRASOUND  TECHNIQUE: Gray-scale sonography with graded compression, as well as color Doppler and duplex ultrasound, were performed to evaluate the deep venous system from the level of the common femoral vein through the popliteal and proximal calf veins. Spectral Doppler was utilized to evaluate flow at rest and with distal augmentation maneuvers.  COMPARISON:  None.  FINDINGS: Nonocclusive thrombus is identified in the right profunda femoral and superficial femoral veins. The common femoral vein is normally patent. The saphenous femoral junction is open. Some of the femoral thrombus may be more chronic in nature based on sonographic appearance. There is additional thrombus visualized in the popliteal vein and extending into tibial veins of the calf. This thrombus may be more acute in nature based on appearance and would also fit the clinical picture of recent development of acute right calf symptoms.  Other findings: No evidence of superficial thrombophlebitis or abnormal fluid collection.  IMPRESSION: DVT in the right lower  extremity involving the femoral veins, popliteal vein and tibial veins. While some of the thrombus may be more chronic in nature based on appearance by ultrasound in the upper thigh, the popliteal and tibial thrombus may be more acute in nature given appearance.   Electronically Signed   By: Irish Lack M.D.   On: 04/17/2013 18:52    EKG Interpretation   None       MDM   1. DVT (deep venous thrombosis), right     Pt sent for Lovenox injection after positive Korea earlier today from PCP. Dr. Milinda Cave will arrange further anticoagulation tomorrow morning.   I personally performed the services described in this documentation, which was scribed in my presence. The recorded information has been reviewed and is accurate.      Wesley B. Bernette Mayers, MD 04/17/13 [phone]

## 2013-04-17 NOTE — Progress Notes (Signed)
OFFICE NOTE  04/17/2013  CC:  Chief Complaint  Patient presents with  . Leg Swelling    Right leg x noon     HPI: Patient is a 79 y.o. Caucasian male who is here for right calf swelling and pain that started about 3 hours ago.  No injury.  No recent prolonged immobilization or surgical procedure. He feels no f/c/malaise and has no rash on the leg. He came off of coumadin a few months ago after having been on it for 4 yrs s/p right LE DVT and PE.  Pertinent PMH:  Past Medical History  Diagnosis Date  . Diabetes mellitus Onset approx 1980  . Hyperlipidemia   . Carotid artery occlusion   . Calf DVT (deep venous thrombosis) 2010    a. hx of right calf - chronic coumadin since 12/2008  . PVD (peripheral vascular disease)     ?Tibial dz? Re-eval 01/29/13 by Vascular MD concluded that no significant evidence of PAD was present.  . Coronary artery disease     a. s/p MI & CABG in 2000;  b. 04/2012 Cath/PTCA:  VG-> RI patent w/ 99 in native RI (PTCA only );  c.  LHC 09/20/12:  oLM 30%, mid LAD occluded, prox D1 80%, mid CFX 100, OM1 30, prox RCA 100, S-RI, native RI 80% (stable), S-OM patent, native OM2 100 (filled via R collats) - culprit, L-LAD patent, S-RCA mid 50%. - med rx; e. 11/2012 NSTEMI->med rx.  Marland Kitchen Hx pulmonary embolism 2010    a. bilateral;  b. Chronic coumadin since 12/2008  . Stroke 2010    [facility].  . Ischemic cardiomyopathy     a. 04/2012 Echo: EF 40-45%.;  b.  Echo 5/14: EF 60-65%, NL WM, Gr 1 DD  . Hypertension   . Osteoarthritis     Low back and left knee and both hips  . CKD (chronic kidney disease)     left sided chronic high grade obstruction on NM renal imaging (09/2011)--9% function compared to 90% function on right side.  . Nephrolithiasis     with left ureterolithiasis; hx of ureteral stent on left--has resulted in markedly decreased function of left kidney.  . Diabetic retinopathy associated with type 2 diabetes mellitus   . Squamous cell  carcinoma 2008    on his back  . Insomnia   . History of pneumonia   . Diabetic peripheral neuropathy     was on neurontin at one time   Past Surgical History  Procedure Laterality Date  . Coronary artery bypass graft  2000    4 vessel cabg in [place], [place].  . Cardiac catheterization  04/2012    patent grafts, severe three-vessel CAD, severe diffuse small vessel disease and residual CAD. He underwent angioplasty alone to a 99% ramus intermedius lesion with resultant dissection and 70% restenosis.  . Carotid endarterectomy  2000    bilateral  . Lithotripsy      (Dr. Annabell Howells)  . Cardiac catheterization  09/2012    Occlusion of the native OM 2 with right-to-left collateral filling, prior balloon angioplasty site unchanged, SVG-RCA was unchanged, mildly elevated LVEDP  . Colonoscopy  2004    Dr. Kizzie Bane in [place], [place].   History   Social History Narrative   Widowed 2012, 1 daughter and 2 step daughters.   Lives alone in [place].   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   No T/A/Ds.  MEDS:  Outpatient Prescriptions Prior to Visit  Medication Sig Dispense Refill  . aspirin 81 MG tablet Take 81 mg by mouth daily.      Marland Kitchen atorvastatin (LIPITOR) 80 MG tablet Take 80 mg by mouth daily.      . carvedilol (COREG) 6.25 MG tablet Take 6.25 mg by mouth 2 (two) times daily with a meal.      . diphenhydramine-acetaminophen (TYLENOL PM) 25-500 MG TABS Take 2 tablets by mouth at bedtime.      . fish oil-omega-3 fatty acids 1000 MG capsule Take 2 g by mouth daily.      . hydrALAZINE (APRESOLINE) 25 MG tablet Take 1 tablet (25 mg total) by mouth 2 (two) times daily.  60 tablet  3  . insulin glargine (LANTUS) 100 UNIT/ML injection Inject 25 Units into the skin every morning.       . isosorbide mononitrate (IMDUR) 120 MG 24 hr tablet Take 1 tablet (120 mg total) by mouth daily.  30 tablet  6  . nitroGLYCERIN (NITROSTAT) 0.4  MG SL tablet Place 0.4 mg under the tongue every 5 (five) minutes x 3 doses as needed for chest pain.       No facility-administered medications prior to visit.    PE: Blood pressure 109/65, pulse 60, temperature 98.5 F (36.9 C), temperature source Temporal, resp. rate 20, height 6\' 2"  (1.88 m), weight 212 lb (96.163 kg), SpO2 98.00%. Gen: Alert, well appearing.  Patient is oriented to person, place, time, and situation. Right calf with generalized swelling but no erythema or rash. Mild right calf TTP.  No cord palpable.  Popliteal fossa and thigh and ankle are normal. Right calf circ 15 cm below patella is 43 cm. Left calf circ at 15 cm below patella is 39 cm. 1-2 +pitting edema on right LL, 1+ on LLL.  IMPRESSION AND PLAN:  Right calf swelling worrisome for recurrent DVT. Will obtain venous doppler ASAP.  An After Visit Summary was printed and given to the patient.  FOLLOW UP: To be determined based on results of pending workup.

## 2013-04-17 NOTE — ED Notes (Addendum)
Pt sent here by PMD with Korea results dx DVT right leg

## 2013-04-18 ENCOUNTER — Telehealth: Payer: Self-pay | Admitting: Family Medicine

## 2013-04-18 ENCOUNTER — Other Ambulatory Visit: Payer: Self-pay | Admitting: Family Medicine

## 2013-04-18 DIAGNOSIS — R5381 Other malaise: Secondary | ICD-10-CM

## 2013-04-18 DIAGNOSIS — I82401 Acute embolism and thrombosis of unspecified deep veins of right lower extremity: Secondary | ICD-10-CM

## 2013-04-18 NOTE — Telephone Encounter (Signed)
Pls call pt and recommend that he start coumadin 5mg  once daily--first dose now. He should still have some of his coumadin left over from when he took it previously.  If not, pls eRx generic coumadin 5mg  tabs, take as directed by MD, #30, RF x 6.

## 2013-04-19 NOTE — Telephone Encounter (Signed)
Patient aware.

## 2013-04-22 ENCOUNTER — Other Ambulatory Visit: Payer: Self-pay | Admitting: Family Medicine

## 2013-04-22 LAB — PROTIME-INR: Prothrombin Time: 24.7 seconds — ABNORMAL HIGH (ref 11.6–15.2)

## 2013-04-23 ENCOUNTER — Other Ambulatory Visit: Payer: Self-pay | Admitting: Family Medicine

## 2013-04-23 DIAGNOSIS — I82401 Acute embolism and thrombosis of unspecified deep veins of right lower extremity: Secondary | ICD-10-CM

## 2013-04-23 DIAGNOSIS — I82409 Acute embolism and thrombosis of unspecified deep veins of unspecified lower extremity: Secondary | ICD-10-CM

## 2013-05-07 ENCOUNTER — Ambulatory Visit: Payer: Medicare Other | Admitting: Family Medicine

## 2013-05-08 ENCOUNTER — Other Ambulatory Visit: Payer: Self-pay | Admitting: *Deleted

## 2013-05-08 ENCOUNTER — Other Ambulatory Visit: Payer: Self-pay | Admitting: Family Medicine

## 2013-05-08 DIAGNOSIS — I82409 Acute embolism and thrombosis of unspecified deep veins of unspecified lower extremity: Secondary | ICD-10-CM

## 2013-05-08 LAB — PROTIME-INR: INR: 1.79 — ABNORMAL HIGH (ref <1.50)

## 2013-05-13 ENCOUNTER — Telehealth: Payer: Self-pay | Admitting: Family Medicine

## 2013-05-13 NOTE — Telephone Encounter (Signed)
Patient would like to know what were his recent test results were. He also wants to know if he is due to have his A1C checked. Please call.

## 2013-05-13 NOTE — Telephone Encounter (Signed)
Advised patient that Dr. Milinda Cave will be sure that his lab work is all up to date.

## 2013-05-28 ENCOUNTER — Other Ambulatory Visit (INDEPENDENT_AMBULATORY_CARE_PROVIDER_SITE_OTHER): Payer: Medicare Other

## 2013-05-28 DIAGNOSIS — Z86718 Personal history of other venous thrombosis and embolism: Secondary | ICD-10-CM

## 2013-06-06 ENCOUNTER — Other Ambulatory Visit (HOSPITAL_COMMUNITY): Payer: Self-pay | Admitting: Physician Assistant

## 2013-06-07 ENCOUNTER — Other Ambulatory Visit (HOSPITAL_COMMUNITY): Payer: Self-pay | Admitting: Physician Assistant

## 2013-06-11 ENCOUNTER — Other Ambulatory Visit (INDEPENDENT_AMBULATORY_CARE_PROVIDER_SITE_OTHER): Payer: Medicare Other

## 2013-06-11 DIAGNOSIS — I82409 Acute embolism and thrombosis of unspecified deep veins of unspecified lower extremity: Secondary | ICD-10-CM

## 2013-06-11 DIAGNOSIS — E119 Type 2 diabetes mellitus without complications: Secondary | ICD-10-CM

## 2013-06-11 DIAGNOSIS — I82401 Acute embolism and thrombosis of unspecified deep veins of right lower extremity: Secondary | ICD-10-CM

## 2013-06-11 NOTE — Progress Notes (Signed)
This encounter was created in error - please disregard.

## 2013-06-12 ENCOUNTER — Other Ambulatory Visit: Payer: Self-pay | Admitting: Family Medicine

## 2013-06-12 ENCOUNTER — Telehealth: Payer: Self-pay | Admitting: Family Medicine

## 2013-06-12 DIAGNOSIS — E119 Type 2 diabetes mellitus without complications: Secondary | ICD-10-CM

## 2013-06-12 LAB — PROTIME-INR
INR: 3.4 ratio — ABNORMAL HIGH (ref 0.8–1.0)
PROTHROMBIN TIME: 35.5 s — AB (ref 10.2–12.4)

## 2013-06-12 NOTE — Telephone Encounter (Signed)
Pt presented for lab visit yesterday (PT/INR) and requested a HbA1c repeat, specifically b/c his cardiologist wanted it. I ordered HbA1c today on the blood that was drawn and kept yesterday.

## 2013-06-14 LAB — HEMOGLOBIN A1C: Hgb A1c MFr Bld: 6.8 % — ABNORMAL HIGH (ref 4.6–6.5)

## 2013-06-25 ENCOUNTER — Other Ambulatory Visit (INDEPENDENT_AMBULATORY_CARE_PROVIDER_SITE_OTHER): Payer: Medicare Other

## 2013-06-25 DIAGNOSIS — Z86718 Personal history of other venous thrombosis and embolism: Secondary | ICD-10-CM

## 2013-06-25 LAB — PROTIME-INR
INR: 1.8 ratio — AB (ref 0.8–1.0)
Prothrombin Time: 19 s — ABNORMAL HIGH (ref 10.2–12.4)

## 2013-07-10 ENCOUNTER — Ambulatory Visit: Payer: Medicare Other | Admitting: Family Medicine

## 2013-07-10 ENCOUNTER — Other Ambulatory Visit (HOSPITAL_COMMUNITY): Payer: Self-pay | Admitting: Cardiology

## 2013-07-10 ENCOUNTER — Other Ambulatory Visit (HOSPITAL_COMMUNITY): Payer: Self-pay | Admitting: Nurse Practitioner

## 2013-07-10 ENCOUNTER — Ambulatory Visit (INDEPENDENT_AMBULATORY_CARE_PROVIDER_SITE_OTHER): Payer: Medicare Other | Admitting: Family Medicine

## 2013-07-10 ENCOUNTER — Encounter: Payer: Self-pay | Admitting: Family Medicine

## 2013-07-10 VITALS — BP 112/72 | HR 70 | Temp 98.0°F | Resp 18 | Ht 74.0 in | Wt 214.0 lb

## 2013-07-10 DIAGNOSIS — E118 Type 2 diabetes mellitus with unspecified complications: Secondary | ICD-10-CM

## 2013-07-10 DIAGNOSIS — I82409 Acute embolism and thrombosis of unspecified deep veins of unspecified lower extremity: Secondary | ICD-10-CM

## 2013-07-10 DIAGNOSIS — E119 Type 2 diabetes mellitus without complications: Secondary | ICD-10-CM

## 2013-07-10 DIAGNOSIS — I10 Essential (primary) hypertension: Secondary | ICD-10-CM

## 2013-07-10 NOTE — Progress Notes (Signed)
Pre visit review using our clinic review tool, if applicable. No additional management support is needed unless otherwise documented below in the visit note. 

## 2013-07-11 ENCOUNTER — Telehealth: Payer: Self-pay | Admitting: *Deleted

## 2013-07-11 LAB — BASIC METABOLIC PANEL
BUN: 17 mg/dL (ref 6–23)
CALCIUM: 9.2 mg/dL (ref 8.4–10.5)
CO2: 23 mEq/L (ref 19–32)
Chloride: 106 mEq/L (ref 96–112)
Creatinine, Ser: 1.5 mg/dL (ref 0.4–1.5)
GFR: 49.38 mL/min — ABNORMAL LOW (ref >60.00)
Glucose, Bld: 181 mg/dL — ABNORMAL HIGH (ref 70–99)
Potassium: 4.2 mEq/L (ref 3.5–5.1)
Sodium: 136 mEq/L (ref 135–145)

## 2013-07-11 LAB — MICROALBUMIN / CREATININE URINE RATIO
Creatinine,U: 300.9 mg/dL
MICROALB UR: 8.7 mg/dL — AB (ref 0.0–1.9)
Microalb Creat Ratio: 2.9 mg/g (ref 0.0–30.0)

## 2013-07-11 NOTE — Telephone Encounter (Signed)
[facility] called and said that blood sample will have to be redrawn and spun w/serum removed. The sample they received today was know good.

## 2013-07-14 DIAGNOSIS — E118 Type 2 diabetes mellitus with unspecified complications: Secondary | ICD-10-CM | POA: Insufficient documentation

## 2013-07-14 DIAGNOSIS — I82409 Acute embolism and thrombosis of unspecified deep veins of unspecified lower extremity: Secondary | ICD-10-CM | POA: Insufficient documentation

## 2013-07-14 NOTE — Progress Notes (Signed)
OFFICE NOTE  07/14/2013  CC:  Chief Complaint  Patient presents with  . Follow-up     HPI: Patient is a 80 y.o. Caucasian male who is here for 4 mo f/u DM 2, recurrent DVT/coumadin management, hyperlipidemia, chronic balance dysfunction/unsteady gait s/p CVA and also due to DPN.  He reports no new symptoms.  Says he is getting along all right but "I'm ready to die", he says with a slight smile. He asks for DNR form today.  He denies feeling depressed, denies SI or HI.  Slight/mild chronic feet burning sensation, chronic. No falls recently.  No nosebleeds, no melena, no hematochezia, no hematuria.  Pertinent PMH:  Past medical, surgical, social, and family history reviewed and no changes are noted since last office visit.  MEDS:  Outpatient Prescriptions Prior to Visit  Medication Sig Dispense Refill  . aspirin 81 MG tablet Take 81 mg by mouth daily.      . carvedilol (COREG) 6.25 MG tablet Take 6.25 mg by mouth 2 (two) times daily with a meal.      . diphenhydramine-acetaminophen (TYLENOL PM) 25-500 MG TABS Take 2 tablets by mouth at bedtime.      . fish oil-omega-3 fatty acids 1000 MG capsule Take 2 g by mouth daily.      . insulin glargine (LANTUS) 100 UNIT/ML injection Inject 25 Units into the skin every morning.       . nitroGLYCERIN (NITROSTAT) 0.4 MG SL tablet Place 0.4 mg under the tongue every 5 (five) minutes x 3 doses as needed for chest pain.      Marland Kitchen atorvastatin (LIPITOR) 80 MG tablet Take 80 mg by mouth daily.      Marland Kitchen atorvastatin (LIPITOR) 80 MG tablet TAKE 1 TABLET BY MOUTH EVERY DAY  30 tablet  0  . hydrALAZINE (APRESOLINE) 25 MG tablet Take 1 tablet (25 mg total) by mouth 2 (two) times daily.  60 tablet  3  . hydrALAZINE (APRESOLINE) 25 MG tablet Take 1 tablet (25 mg total) by mouth 2 (two) times daily.  60 tablet  1  . enoxaparin (LOVENOX) 100 MG/ML injection 1 ml SQ q12h  10 Syringe  0  . isosorbide mononitrate (IMDUR) 120 MG 24 hr tablet Take 1 tablet (120 mg  total) by mouth daily.  30 tablet  6   No facility-administered medications prior to visit.    PE: Blood pressure 112/72, pulse 70, temperature 98 F (36.7 C), temperature source Temporal, resp. rate 18, height 6\' 2"  (1.88 m), weight 214 lb (97.07 kg), SpO2 97.00%. Gen: alert, NAD, oriented x 4. Walks unsteadily with a cane. CV: RRR LUNGS: Clear, breathing nonlabored. EXT: no edema. Foot exam - bilateral normal; no swelling, tenderness or skin or vascular lesions. Color and temperature is normal. Sensation is intact. Peripheral pulses are palpable. Toenails are normal.   IMPRESSION AND PLAN:  Diabetes mellitus with complication Feet exam normal today.  He has mild sx's of DPN. The current medical regimen is effective;  continue present plan and medications. Lab Results  Component Value Date   HGBA1C 6.8* 06/12/2013  Urine microalb/cr today. Next HbA1c after 12/2013 unless home monitoring indicates worsening control. Prevnar 13 IM given today.  DVT, lower extremity, recurrent Coumadin therapy. Lab Results  Component Value Date   INR 1.8* 06/25/2013   INR 3.4* 06/11/2013   INR 5.8* 05/28/2013   Due for PT/INR today.  Unspecified essential hypertension The current medical regimen is effective;  continue present plan and medications. BMET  today.   An After Visit Summary was printed and given to the patient.  FOLLOW UP: 33mo

## 2013-07-14 NOTE — Assessment & Plan Note (Signed)
The current medical regimen is effective;  continue present plan and medications. BMET today. 

## 2013-07-14 NOTE — Assessment & Plan Note (Signed)
Coumadin therapy. Lab Results  Component Value Date   INR 1.8* 06/25/2013   INR 3.4* 06/11/2013   INR 5.8* 05/28/2013   Due for PT/INR today.

## 2013-07-14 NOTE — Assessment & Plan Note (Addendum)
Feet exam normal today.  He has mild sx's of DPN. The current medical regimen is effective;  continue present plan and medications. Lab Results  Component Value Date   HGBA1C 6.8* 06/12/2013  Urine microalb/cr today. Next HbA1c after 12/2013 unless home monitoring indicates worsening control. Prevnar 13 IM given today.

## 2013-07-15 NOTE — Addendum Note (Signed)
Addended by: Ralph Dowdy on: 07/15/2013 04:38 PM   Modules accepted: Orders

## 2013-07-15 NOTE — Telephone Encounter (Signed)
Patent aware.  He will come in for redraw tomorrow.

## 2013-07-16 ENCOUNTER — Other Ambulatory Visit (INDEPENDENT_AMBULATORY_CARE_PROVIDER_SITE_OTHER): Payer: Medicare Other

## 2013-07-16 DIAGNOSIS — I82409 Acute embolism and thrombosis of unspecified deep veins of unspecified lower extremity: Secondary | ICD-10-CM

## 2013-07-16 LAB — PROTIME-INR
INR: 1.9 ratio — AB (ref 0.8–1.0)
Prothrombin Time: 19.5 s — ABNORMAL HIGH (ref 10.2–12.4)

## 2013-07-16 MED ORDER — INSULIN GLARGINE 100 UNIT/ML ~~LOC~~ SOLN: 30.0000 [IU] | Freq: Every morning | SUBCUTANEOUS | Status: DC

## 2013-08-10 ENCOUNTER — Other Ambulatory Visit (HOSPITAL_COMMUNITY): Payer: Self-pay | Admitting: Cardiology

## 2013-08-14 ENCOUNTER — Other Ambulatory Visit: Payer: Self-pay | Admitting: *Deleted

## 2013-08-14 MED ORDER — CARVEDILOL 6.25 MG PO TABS: 6.2500 mg | ORAL_TABLET | Freq: Two times a day (BID) | ORAL | Status: DC

## 2013-08-20 ENCOUNTER — Other Ambulatory Visit (INDEPENDENT_AMBULATORY_CARE_PROVIDER_SITE_OTHER): Payer: Medicare Other

## 2013-08-20 DIAGNOSIS — I82409 Acute embolism and thrombosis of unspecified deep veins of unspecified lower extremity: Secondary | ICD-10-CM

## 2013-08-20 LAB — PROTIME-INR
INR: 3 ratio — ABNORMAL HIGH (ref 0.8–1.0)
Prothrombin Time: 31.2 s — ABNORMAL HIGH (ref 10.2–12.4)

## 2013-09-07 ENCOUNTER — Other Ambulatory Visit (HOSPITAL_COMMUNITY): Payer: Self-pay | Admitting: Cardiology

## 2013-09-11 ENCOUNTER — Other Ambulatory Visit: Payer: Medicare Other

## 2013-09-11 ENCOUNTER — Other Ambulatory Visit: Payer: Self-pay | Admitting: Family Medicine

## 2013-09-23 ENCOUNTER — Other Ambulatory Visit: Payer: Medicare Other

## 2013-09-24 ENCOUNTER — Other Ambulatory Visit (INDEPENDENT_AMBULATORY_CARE_PROVIDER_SITE_OTHER): Payer: Medicare Other

## 2013-09-24 DIAGNOSIS — I82409 Acute embolism and thrombosis of unspecified deep veins of unspecified lower extremity: Secondary | ICD-10-CM

## 2013-09-27 ENCOUNTER — Other Ambulatory Visit (INDEPENDENT_AMBULATORY_CARE_PROVIDER_SITE_OTHER): Payer: Medicare Other

## 2013-09-27 DIAGNOSIS — I82409 Acute embolism and thrombosis of unspecified deep veins of unspecified lower extremity: Secondary | ICD-10-CM

## 2013-09-27 LAB — PROTIME-INR
INR: 3.1 ratio — AB (ref 0.8–1.0)
Prothrombin Time: 33.8 s — ABNORMAL HIGH (ref 9.6–13.1)

## 2013-09-30 ENCOUNTER — Other Ambulatory Visit: Payer: Medicare Other

## 2013-10-09 ENCOUNTER — Other Ambulatory Visit (HOSPITAL_COMMUNITY): Payer: Self-pay | Admitting: Cardiology

## 2013-10-30 ENCOUNTER — Other Ambulatory Visit (INDEPENDENT_AMBULATORY_CARE_PROVIDER_SITE_OTHER): Payer: Medicare Other

## 2013-10-30 DIAGNOSIS — I82409 Acute embolism and thrombosis of unspecified deep veins of unspecified lower extremity: Secondary | ICD-10-CM

## 2013-10-30 LAB — PROTIME-INR
INR: 2.7 ratio — ABNORMAL HIGH (ref 0.8–1.0)
PROTHROMBIN TIME: 28.8 s — AB (ref 9.6–13.1)

## 2013-11-07 ENCOUNTER — Ambulatory Visit: Payer: Medicare Other | Admitting: Family Medicine

## 2013-11-09 ENCOUNTER — Other Ambulatory Visit (HOSPITAL_COMMUNITY): Payer: Self-pay | Admitting: Cardiology

## 2013-11-13 ENCOUNTER — Ambulatory Visit (INDEPENDENT_AMBULATORY_CARE_PROVIDER_SITE_OTHER): Payer: Medicare Other | Admitting: Family Medicine

## 2013-11-13 ENCOUNTER — Encounter: Payer: Self-pay | Admitting: Family Medicine

## 2013-11-13 VITALS — BP 131/70 | HR 64 | Temp 98.1°F | Resp 16 | Ht 74.0 in | Wt 211.0 lb

## 2013-11-13 DIAGNOSIS — N183 Chronic kidney disease, stage 3 unspecified: Secondary | ICD-10-CM

## 2013-11-13 DIAGNOSIS — E118 Type 2 diabetes mellitus with unspecified complications: Secondary | ICD-10-CM

## 2013-11-13 DIAGNOSIS — R634 Abnormal weight loss: Secondary | ICD-10-CM

## 2013-11-13 DIAGNOSIS — Z86711 Personal history of pulmonary embolism: Secondary | ICD-10-CM

## 2013-11-13 DIAGNOSIS — I824Z9 Acute embolism and thrombosis of unspecified deep veins of unspecified distal lower extremity: Secondary | ICD-10-CM

## 2013-11-13 NOTE — Progress Notes (Signed)
OFFICE NOTE  11/13/2013  CC:  Chief Complaint  Patient presents with  . Follow-up     HPI: Patient is a 80 y.o. Caucasian male who is here for 4 mo f/u DM 2, hyperlipidemia, recurrent DVT/coumadin mgmt, chronic balance dysfunction/unsteady gait s/p CVA and also due to DPN.  No home glucose checks: says he can tell if it is high--he does not think it has been high since I saw him last.  No home bp measurements.  C/o his usual generalized weakness, balance problems, and chronic low back pain.  Says he saw Dr. Amedeo Plenty at [facility] ortho in the last 2 yrs or so and was told that all that he could do was offer pain meds.   Pertinent PMH:  Past medical, surgical, social, and family history reviewed and no changes are noted since last office visit.  MEDS:  Outpatient Prescriptions Prior to Visit  Medication Sig Dispense Refill  . aspirin 81 MG tablet Take 81 mg by mouth daily.      Marland Kitchen atorvastatin (LIPITOR) 80 MG tablet TAKE 1 TABLET BY MOUTH EVERY DAY  30 tablet  2  . carvedilol (COREG) 6.25 MG tablet TAKE 1 TABLET (6.25 MG TOTAL) BY MOUTH 2 (TWO) TIMES DAILY WITH A MEAL.  60 tablet  0  . diphenhydramine-acetaminophen (TYLENOL PM) 25-500 MG TABS Take 2 tablets by mouth at bedtime.      . fish oil-omega-3 fatty acids 1000 MG capsule Take 2 g by mouth daily.      . hydrALAZINE (APRESOLINE) 25 MG tablet TAKE 1 TABLET (25 MG TOTAL) BY MOUTH 2 (TWO) TIMES DAILY.  60 tablet  0  . insulin glargine (LANTUS) 100 UNIT/ML injection Inject 0.3 mLs (30 Units total) into the skin every morning.  10 mL  3  . isosorbide mononitrate (IMDUR) 120 MG 24 hr tablet TAKE 1 TABLET (120 MG TOTAL) BY MOUTH DAILY.  30 tablet  3  . nitroGLYCERIN (NITROSTAT) 0.4 MG SL tablet Place 0.4 mg under the tongue every 5 (five) minutes x 3 doses as needed for chest pain.      Marland Kitchen warfarin (COUMADIN) 5 MG tablet TAKE 1 TABLET (5 MG TOTAL) BY MOUTH EVERY EVENING. AS DIRECTED BY DR. Modena Morrow  120 tablet  0  . warfarin (COUMADIN) 5 MG tablet  Take 5 mg by mouth daily.       No facility-administered medications prior to visit.    PE: Blood pressure 131/70, pulse 64, temperature 98.1 F (36.7 C), temperature source Temporal, resp. rate 16, height 6\' 2"  (1.88 m), weight 211 lb (95.709 kg), SpO2 96.00%. Gen: alert, NAD, A & O x 4. NIO:EVOJ: no injection, icteris, swelling, or exudate.  EOMI, PERRLA. Mouth: lips without lesion/swelling.  Oral mucosa pink and moist. Oropharynx without erythema, exudate, or swelling.  CV: RRR, no m/r/g.   LUNGS: CTA bilat, nonlabored resps, good aeration in all lung fields. EXT: no clubbing, cyanosis, or edema.  BACK: no TTP.  Sitting SLR neg bilat.  LE strength 4+/5 bilat, can't elicit DTRs in knees or ankles.  Gait is small-stepped, not quite shuffling.  +Romberg.  No pronator drift.  No tremor.  NO joint rigidity.  Feet with scaly hyperkeratosis, without ulceration.  IMPRESSION AND PLAN:  1) Post-CVA balance deficit: does fine with cane and sometimes a walker.  Last fall was 3 yrs ago and he did not get injured with this fall.  Offered referral to another PT program that may help with balance/gait stability but he  declined--wants to think about it.  2) DM 2, stable.  Next A1c due in Aug 2015.  3) Hyperlipidemia: not fasting today.  Last lipid panel 1 yr ago was ok.  Continue statin.  4) Hx of recurrent DVT: coumadin mgmt.  INR ok, not due for this today.  An After Visit Summary was printed and given to the patient.  FOLLOW UP: 4 mo

## 2013-11-15 ENCOUNTER — Other Ambulatory Visit: Payer: Self-pay | Admitting: *Deleted

## 2013-11-15 MED ORDER — INSULIN GLARGINE 100 UNIT/ML ~~LOC~~ SOLN: 30.0000 [IU] | Freq: Every morning | SUBCUTANEOUS | Status: DC

## 2013-11-17 ENCOUNTER — Other Ambulatory Visit (HOSPITAL_COMMUNITY): Payer: Self-pay | Admitting: Cardiology

## 2013-11-18 ENCOUNTER — Ambulatory Visit: Payer: Medicare Other | Admitting: Nurse Practitioner

## 2013-11-19 ENCOUNTER — Encounter: Payer: Self-pay | Admitting: Nurse Practitioner

## 2013-11-19 ENCOUNTER — Ambulatory Visit (INDEPENDENT_AMBULATORY_CARE_PROVIDER_SITE_OTHER): Payer: Medicare Other | Admitting: Nurse Practitioner

## 2013-11-19 VITALS — BP 111/70 | HR 62 | Temp 97.8°F | Ht 74.0 in | Wt 212.0 lb

## 2013-11-19 DIAGNOSIS — S51819A Laceration without foreign body of unspecified forearm, initial encounter: Secondary | ICD-10-CM

## 2013-11-19 DIAGNOSIS — S51809A Unspecified open wound of unspecified forearm, initial encounter: Secondary | ICD-10-CM

## 2013-11-19 NOTE — Patient Instructions (Addendum)
Skin care: Leave bandage in place. Keep dry. Home health nurse will be out to change dressing in 3 days. Return to office in 2 weeks for follow up.

## 2013-11-19 NOTE — Progress Notes (Signed)
Pre visit review using our clinic review tool, if applicable. No additional management support is needed unless otherwise documented below in the visit note. 

## 2013-11-19 NOTE — Progress Notes (Signed)
   Subjective:    Patient ID: Wesley [name], male    DOB: September 04, 1931, 80 y.o.   MRN: [id number]  HPI Comments: Wesley [name] ambulates with cane. Lives alone. Has not changed dressing as he is unable to change dressing with 1 arm. Pt reports no change in blood sugars since he fell.  Wound Check He was originally treated 5 to 10 days ago (1 wk). Previous treatment included wound cleansing or irrigation (pt fell while ambulating at home. Landed on concrete. Injured R forearm. Called 911-wound was cleaned & dressed. Old bandage in place for 1 week.). There has been bloody (dark crusted blood on bandage) discharge from the wound. There is no redness present. There is no swelling present. The pain has improved. He has no difficulty moving the affected extremity or digit.      Review of Systems  Constitutional: Negative for fever and appetite change.  Skin: Positive for wound (R forearm & elbow).  Neurological:       Hx strokes. Antalgic gait. Uses cane, walker.  Hematological: Bruises/bleeds easily (on coumadin).  Psychiatric/Behavioral: Negative for confusion.       Objective:   Physical Exam  Vitals reviewed. Constitutional: He is oriented to person, place, and time. He appears well-developed and well-nourished. No distress.  HENT:  Head: Normocephalic and atraumatic.  Right Ear: External ear normal.  Left Ear: External ear normal.  Eyes: Conjunctivae are normal. Right eye exhibits no discharge. Left eye exhibits no discharge.  Cardiovascular: Normal rate.   Pulmonary/Chest: Effort normal. No respiratory distress.  Musculoskeletal:       Arms: Neurological: He is alert and oriented to person, place, and time.  Mild decreased hearing  Skin: Skin is warm and dry.  Psychiatric: He has a normal mood and affect. His behavior is normal. Thought content normal.  Expresses difficulty of aging-health problems, hard to get around, frequent falls, been to several PT for fall prevention.           Assessment & Plan:  1. Skin tear of forearm without complication Wound cleansed & dressed w/vaseline gauze. Pt will need assistance with dressing changes as requires 2 hands.  - Ambulatory referral to [facility] dsg change every 3 days. Cleanse w/saline. OK to use hydrogen peroxide if crusted blood. Notify if s&s of infection.

## 2013-11-22 ENCOUNTER — Telehealth: Payer: Self-pay | Admitting: *Deleted

## 2013-11-22 ENCOUNTER — Telehealth: Payer: Self-pay | Admitting: Family Medicine

## 2013-11-22 DIAGNOSIS — E118 Type 2 diabetes mellitus with unspecified complications: Secondary | ICD-10-CM

## 2013-11-22 NOTE — Telephone Encounter (Signed)
Amy from [facility] is requesting order to have patients A1C drawn next week, she states that medicare wants them drawn every 3 months

## 2013-11-22 NOTE — Telephone Encounter (Signed)
Called pt back about home nurse. Patient stated that [facility] called him and are going to come out to change his wound this afternoon.

## 2013-11-22 NOTE — Telephone Encounter (Signed)
Called pt to let him know that we are waiting to hear back from [facility].

## 2013-11-22 NOTE — Telephone Encounter (Signed)
Patient called to find out if he will receive home nurse to change dressing on his arm. Waiting to hear back from [facility]. If unable to receive home nurse visit today will have pt come in to office for wound care.

## 2013-11-25 NOTE — Telephone Encounter (Signed)
A1C added to orders

## 2013-11-25 NOTE — Telephone Encounter (Signed)
Patient has an appointment for 12/17/13.  Can you call patient and see if he can move up fasting Lab appointment.

## 2013-11-25 NOTE — Telephone Encounter (Signed)
Please add lab order in for A1C

## 2013-11-28 ENCOUNTER — Other Ambulatory Visit: Payer: Medicare Other

## 2013-11-29 ENCOUNTER — Other Ambulatory Visit (INDEPENDENT_AMBULATORY_CARE_PROVIDER_SITE_OTHER): Payer: Medicare Other

## 2013-11-29 ENCOUNTER — Telehealth: Payer: Self-pay | Admitting: Family Medicine

## 2013-11-29 DIAGNOSIS — N183 Chronic kidney disease, stage 3 unspecified: Secondary | ICD-10-CM

## 2013-11-29 DIAGNOSIS — R634 Abnormal weight loss: Secondary | ICD-10-CM

## 2013-11-29 DIAGNOSIS — E118 Type 2 diabetes mellitus with unspecified complications: Secondary | ICD-10-CM

## 2013-11-29 DIAGNOSIS — Z86711 Personal history of pulmonary embolism: Secondary | ICD-10-CM

## 2013-11-29 DIAGNOSIS — I824Z9 Acute embolism and thrombosis of unspecified deep veins of unspecified distal lower extremity: Secondary | ICD-10-CM

## 2013-11-29 LAB — CBC WITH DIFFERENTIAL/PLATELET
BASOS PCT: 0.6 % (ref 0.0–3.0)
Basophils Absolute: 0.1 10*3/uL (ref 0.0–0.1)
EOS ABS: 0.2 10*3/uL (ref 0.0–0.7)
Eosinophils Relative: 1.9 % (ref 0.0–5.0)
HCT: 43.6 % (ref 39.0–52.0)
Hemoglobin: 14.6 g/dL (ref 13.0–17.0)
LYMPHS PCT: 19.2 % (ref 12.0–46.0)
Lymphs Abs: 1.9 10*3/uL (ref 0.7–4.0)
MCHC: 33.6 g/dL (ref 30.0–36.0)
MCV: 92.6 fl (ref 78.0–100.0)
Monocytes Absolute: 0.5 10*3/uL (ref 0.1–1.0)
Monocytes Relative: 5.1 % (ref 3.0–12.0)
Neutro Abs: 7.4 10*3/uL (ref 1.4–7.7)
Neutrophils Relative %: 73.2 % (ref 43.0–77.0)
Platelets: 166 10*3/uL (ref 150.0–400.0)
RBC: 4.71 Mil/uL (ref 4.22–5.81)
RDW: 14.3 % (ref 11.5–15.5)
WBC: 10.1 10*3/uL (ref 4.0–10.5)

## 2013-11-29 LAB — COMPREHENSIVE METABOLIC PANEL
ALBUMIN: 3.5 g/dL (ref 3.5–5.2)
ALK PHOS: 61 U/L (ref 39–117)
ALT: 19 U/L (ref 0–53)
AST: 21 U/L (ref 0–37)
BUN: 22 mg/dL (ref 6–23)
CALCIUM: 9 mg/dL (ref 8.4–10.5)
CO2: 25 mEq/L (ref 19–32)
Chloride: 106 mEq/L (ref 96–112)
Creatinine, Ser: 1.5 mg/dL (ref 0.4–1.5)
GFR: 48.95 mL/min — ABNORMAL LOW (ref >60.00)
GLUCOSE: 129 mg/dL — AB (ref 70–99)
POTASSIUM: 4.1 meq/L (ref 3.5–5.1)
Sodium: 138 mEq/L (ref 135–145)
Total Bilirubin: 0.9 mg/dL (ref 0.2–1.2)
Total Protein: 6.4 g/dL (ref 6.0–8.3)

## 2013-11-29 LAB — PROTIME-INR
INR: 2.8 ratio — ABNORMAL HIGH (ref 0.8–1.0)
PROTHROMBIN TIME: 30 s — AB (ref 9.6–13.1)

## 2013-11-29 LAB — HEMOGLOBIN A1C: Hgb A1c MFr Bld: 6.3 % (ref 4.6–6.5)

## 2013-11-29 LAB — TSH: TSH: 1.86 u[IU]/mL (ref 0.35–4.50)

## 2013-11-29 NOTE — Telephone Encounter (Signed)
Sent a staff message to Darlina Guys with [facility] to see if it's possible.

## 2013-11-29 NOTE — Telephone Encounter (Signed)
Patient is having a lot of trouble getting to the office for his blood tests. Can a referral be set up with [facility]?

## 2013-11-29 NOTE — Telephone Encounter (Signed)
Betsy from [facility] called me stating that he would have to get home skilled nursing in order to get in home PT/INR checks.  Please advise if this is an option for him.

## 2013-12-17 ENCOUNTER — Other Ambulatory Visit: Payer: Medicare Other

## 2013-12-20 ENCOUNTER — Other Ambulatory Visit: Payer: Self-pay | Admitting: Family Medicine

## 2013-12-20 ENCOUNTER — Other Ambulatory Visit (HOSPITAL_COMMUNITY): Payer: Self-pay | Admitting: Cardiology

## 2013-12-30 ENCOUNTER — Ambulatory Visit (INDEPENDENT_AMBULATORY_CARE_PROVIDER_SITE_OTHER): Payer: Medicare Other | Admitting: Family Medicine

## 2013-12-30 ENCOUNTER — Ambulatory Visit: Payer: Medicare Other | Admitting: Nurse Practitioner

## 2013-12-30 ENCOUNTER — Encounter: Payer: Self-pay | Admitting: Family Medicine

## 2013-12-30 VITALS — BP 159/77 | HR 68 | Temp 98.4°F | Resp 18 | Ht 74.0 in | Wt 208.0 lb

## 2013-12-30 DIAGNOSIS — I878 Other specified disorders of veins: Secondary | ICD-10-CM

## 2013-12-30 DIAGNOSIS — B353 Tinea pedis: Secondary | ICD-10-CM | POA: Insufficient documentation

## 2013-12-30 DIAGNOSIS — I872 Venous insufficiency (chronic) (peripheral): Secondary | ICD-10-CM

## 2013-12-30 LAB — PROTIME-INR
INR: 3.3 ratio — ABNORMAL HIGH (ref 0.8–1.0)
PROTHROMBIN TIME: 35.7 s — AB (ref 9.6–13.1)

## 2013-12-30 NOTE — Progress Notes (Signed)
OFFICE NOTE  12/30/2013  CC:  Chief Complaint  Patient presents with  . foot problem    x 2 weeks     HPI: Patient is a 80 y.o. Caucasian male who is here accompanied by his daughter today for discoloration of the skin of feet/toes for the last few weeks. Has been doing epsom salt soaks almost daily and this has helped some superficial layers slough off. No pain in feet or toes.  Says he can feel feet and toes fine.    Pertinent PMH:  Past medical, surgical, social, and family history reviewed and no changes are noted since last office visit.  MEDS:  Outpatient Prescriptions Prior to Visit  Medication Sig Dispense Refill  . aspirin 81 MG tablet Take 81 mg by mouth daily.      Marland Kitchen atorvastatin (LIPITOR) 80 MG tablet TAKE 1 TABLET BY MOUTH EVERY DAY  30 tablet  2  . carvedilol (COREG) 6.25 MG tablet TAKE 1 TABLET (6.25 MG TOTAL) BY MOUTH 2 (TWO) TIMES DAILY WITH A MEAL.  60 tablet  0  . diphenhydramine-acetaminophen (TYLENOL PM) 25-500 MG TABS Take 2 tablets by mouth at bedtime.      . fish oil-omega-3 fatty acids 1000 MG capsule Take 2 g by mouth daily.      . hydrALAZINE (APRESOLINE) 25 MG tablet TAKE 1 TABLET (25 MG TOTAL) BY MOUTH 2 (TWO) TIMES DAILY.  60 tablet  0  . insulin glargine (LANTUS) 100 UNIT/ML injection Inject 0.3 mLs (30 Units total) into the skin every morning.  10 mL  3  . isosorbide mononitrate (IMDUR) 120 MG 24 hr tablet TAKE 1 TABLET (120 MG TOTAL) BY MOUTH DAILY.  30 tablet  1  . nitroGLYCERIN (NITROSTAT) 0.4 MG SL tablet Place 0.4 mg under the tongue every 5 (five) minutes x 3 doses as needed for chest pain.      Marland Kitchen warfarin (COUMADIN) 5 MG tablet TAKE 1 TABLET (5 MG TOTAL) BY MOUTH EVERY EVENING. AS DIRECTED BY DR. Modena Morrow  120 tablet  0   No facility-administered medications prior to visit.    PE: Blood pressure 159/77, pulse 68, temperature 98.4 F (36.9 C), temperature source Temporal, resp. rate 18, height 6\' 2"  (1.88 m), weight 208 lb (94.348 kg), SpO2  96.00%. Gen: Alert, well appearing.  Patient is oriented to person, place, time, and situation. LE's: trace to 1+ pitting edema in both LL's below the knees.  DP and PT pulses 2+ bilat. Hemosiderin skin changes and diffuse sclerosis-feel to skin of LL's bilat. Some superficial peeling of greyish skin over tops of some toes and lateral aspect of feet.  Toenails thickened, white substance underneath all nails. Bottoms of feet with superficial peeling.  No ulceration or skin openings.  IMPRESSION AND PLAN:  LE venous insufficiency with chronic stasis skin changes. Reassured.  I think he has some tinea pedis superimposed on this. Instructions: Continue epsom salt soaks once daily for 30 minutes.  Add over the counter generic lamisil cream twice per day for at least 2 months.  An After Visit Summary was printed and given to the patient.  FOLLOW UP: prn

## 2013-12-30 NOTE — Patient Instructions (Signed)
Continue epsom salt soaks once daily for 30 minutes.  Add over the counter generic lamisil cream twice per day for at least 2 months.

## 2013-12-30 NOTE — Addendum Note (Signed)
Addended by: Jannette Spanner on: 12/30/2013 02:00 PM   Modules accepted: Orders

## 2013-12-30 NOTE — Progress Notes (Signed)
Pre visit review using our clinic review tool, if applicable. No additional management support is needed unless otherwise documented below in the visit note. 

## 2014-01-04 HISTORY — PX: OTHER SURGICAL HISTORY: SHX169

## 2014-01-04 HISTORY — PX: TRANSTHORACIC ECHOCARDIOGRAM: SHX275

## 2014-01-06 ENCOUNTER — Other Ambulatory Visit: Payer: Medicare Other

## 2014-01-13 ENCOUNTER — Ambulatory Visit: Payer: Medicare Other | Admitting: Family Medicine

## 2014-01-14 ENCOUNTER — Ambulatory Visit (INDEPENDENT_AMBULATORY_CARE_PROVIDER_SITE_OTHER): Payer: Medicare Other | Admitting: Family Medicine

## 2014-01-14 ENCOUNTER — Encounter: Payer: Self-pay | Admitting: Family Medicine

## 2014-01-14 ENCOUNTER — Other Ambulatory Visit: Payer: Medicare Other

## 2014-01-14 VITALS — BP 154/76 | HR 64 | Temp 98.7°F | Resp 18 | Ht 74.0 in | Wt 209.0 lb

## 2014-01-14 DIAGNOSIS — Z7901 Long term (current) use of anticoagulants: Secondary | ICD-10-CM

## 2014-01-14 DIAGNOSIS — I82409 Acute embolism and thrombosis of unspecified deep veins of unspecified lower extremity: Secondary | ICD-10-CM

## 2014-01-14 DIAGNOSIS — E860 Dehydration: Secondary | ICD-10-CM

## 2014-01-14 DIAGNOSIS — T675XXD Heat exhaustion, unspecified, subsequent encounter: Secondary | ICD-10-CM

## 2014-01-14 DIAGNOSIS — Z5189 Encounter for other specified aftercare: Secondary | ICD-10-CM

## 2014-01-14 DIAGNOSIS — T675XXA Heat exhaustion, unspecified, initial encounter: Secondary | ICD-10-CM

## 2014-01-14 LAB — BASIC METABOLIC PANEL
BUN: 21 mg/dL (ref 6–23)
CALCIUM: 8.9 mg/dL (ref 8.4–10.5)
CO2: 21 meq/L (ref 19–32)
Chloride: 104 mEq/L (ref 96–112)
Creatinine, Ser: 1.8 mg/dL — ABNORMAL HIGH (ref 0.4–1.5)
GFR: 38.49 mL/min — ABNORMAL LOW (ref >60.00)
Glucose, Bld: 146 mg/dL — ABNORMAL HIGH (ref 70–99)
Potassium: 4.1 mEq/L (ref 3.5–5.1)
SODIUM: 137 meq/L (ref 135–145)

## 2014-01-14 LAB — PROTIME-INR
INR: 4.5 ratio — ABNORMAL HIGH (ref 0.8–1.0)
PROTHROMBIN TIME: 47.8 s — AB (ref 9.6–13.1)

## 2014-01-14 NOTE — Patient Instructions (Signed)
Lower extremity venous insufficiency with chronic venous stasis dermatitis.

## 2014-01-14 NOTE — Assessment & Plan Note (Signed)
With recent dehydration, admission to hospital. Will obtain hosp records for review. He is well currently. Will check BMET and PT/INR today. Continue all meds at current doses.

## 2014-01-14 NOTE — Progress Notes (Signed)
OFFICE VISIT  01/14/2014   CC:  Chief Complaint  Patient presents with  . Follow-up   HPI:    Patient is a 80 y.o. Caucasian male who presents for hosp f/u and a [place] hosp not in [facility] system. Admitted for x 24h for dehydration 1 week ago: had been in a very hot environment for a long time while at a class reunion. Sounds like heat exhaustion sx's: some dizziness, low bp, vomited x 1.  Had not been ill prior to going to the class reunion. In hosp it sounds like some adjustments were attempted to his hydralizine: 50 tid up from 25 mg bid. He says the 50mg  dose taken twice a day made him feel like he was going to pass out.  He is now back on 25 bid and feels back to his normal self. Order for PT/INR given to pt; possibly his INR was abnl in hosp.  His coumadin dosing was not changed per pt report. No records available at this time.   Past Medical History  Diagnosis Date  . Diabetes mellitus Onset approx 1980  . Hyperlipidemia   . Carotid artery occlusion   . Calf DVT (deep venous thrombosis) 2010    a. hx of right calf - chronic coumadin since 12/2008.  Recurrence shortly after d/c of coumadin 03/2013--coumadin restarted.  Marland Kitchen PVD (peripheral vascular disease)     ?Tibial dz? Re-eval 01/29/13 by Vascular MD concluded that no significant evidence of PAD was present.  . Coronary artery disease     a. s/p MI & CABG in 2000;  b. 04/2012 Cath/PTCA:  VG-> RI patent w/ 99 in native RI (PTCA only );  c.  LHC 09/20/12:  oLM 30%, mid LAD occluded, prox D1 80%, mid CFX 100, OM1 30, prox RCA 100, S-RI, native RI 80% (stable), S-OM patent, native OM2 100 (filled via R collats) - culprit, L-LAD patent, S-RCA mid 50%. - med rx; e. 11/2012 NSTEMI->med rx.  Marland Kitchen Hx pulmonary embolism 2010    a. bilateral;  b. Chronic coumadin since 12/2008  . Stroke 2010    [facility].  . Ischemic cardiomyopathy     a. 04/2012 Echo: EF 40-45%.;  b.  Echo 5/14: EF 60-65%, NL WM, Gr 1 DD  .  Hypertension   . Osteoarthritis     Low back and left knee and both hips  . CKD (chronic kidney disease)     left sided chronic high grade obstruction on NM renal imaging (09/2011)--9% function compared to 90% function on right side.  . Nephrolithiasis     with left ureterolithiasis; hx of ureteral stent on left--has resulted in markedly decreased function of left kidney.  . Diabetic retinopathy associated with type 2 diabetes mellitus   . Squamous cell carcinoma 2008    on his back  . Insomnia   . History of pneumonia   . Diabetic peripheral neuropathy     was on neurontin at one time    Past Surgical History  Procedure Laterality Date  . Coronary artery bypass graft  2000    4 vessel cabg in [place], [place].  . Cardiac catheterization  04/2012    patent grafts, severe three-vessel CAD, severe diffuse small vessel disease and residual CAD. He underwent angioplasty alone to a 99% ramus intermedius lesion with resultant dissection and 70% restenosis.  . Carotid endarterectomy  2000    bilateral  . Lithotripsy      (Dr. Jeffie Pollock)  . Cardiac catheterization  09/2012    Occlusion of the native OM 2 with right-to-left collateral filling, prior balloon angioplasty site unchanged, SVG-RCA was unchanged, mildly elevated LVEDP  . Colonoscopy  2004    Dr. Ysidro Evert in [place], [place].    Outpatient Prescriptions Prior to Visit  Medication Sig Dispense Refill  . aspirin 81 MG tablet Take 81 mg by mouth daily.      Marland Kitchen atorvastatin (LIPITOR) 80 MG tablet TAKE 1 TABLET BY MOUTH EVERY DAY  30 tablet  2  . carvedilol (COREG) 6.25 MG tablet TAKE 1 TABLET (6.25 MG TOTAL) BY MOUTH 2 (TWO) TIMES DAILY WITH A MEAL.  60 tablet  0  . diphenhydramine-acetaminophen (TYLENOL PM) 25-500 MG TABS Take 2 tablets by mouth at bedtime.      . fish oil-omega-3 fatty acids 1000 MG capsule Take 2 g by mouth daily.      . hydrALAZINE (APRESOLINE) 25 MG tablet TAKE 1 TABLET (25 MG TOTAL) BY MOUTH 2 (TWO) TIMES DAILY.  60  tablet  0  . insulin glargine (LANTUS) 100 UNIT/ML injection Inject 0.3 mLs (30 Units total) into the skin every morning.  10 mL  3  . isosorbide mononitrate (IMDUR) 120 MG 24 hr tablet TAKE 1 TABLET (120 MG TOTAL) BY MOUTH DAILY.  30 tablet  1  . nitroGLYCERIN (NITROSTAT) 0.4 MG SL tablet Place 0.4 mg under the tongue every 5 (five) minutes x 3 doses as needed for chest pain.      Marland Kitchen warfarin (COUMADIN) 5 MG tablet TAKE 1 TABLET (5 MG TOTAL) BY MOUTH EVERY EVENING. AS DIRECTED BY DR. Modena Morrow  120 tablet  0   No facility-administered medications prior to visit.    Allergies  Allergen Reactions  . Doxycycline     swelling  . Morphine And Related     NAUSEA    . Niacin And Related Other (See Comments)    Hot flashes    ROS As per HPI  PE: Blood pressure 154/76, pulse 64, temperature 98.7 F (37.1 C), temperature source Temporal, resp. rate 18, height 6\' 2"  (1.88 m), weight 209 lb (94.802 kg), SpO2 97.00%. Gen: Alert, well appearing.  Patient is oriented to person, place, time, and situation. RKY:HCWC: no injection, icteris, swelling, or exudate.  EOMI, PERRLA. Mouth: lips without lesion/swelling.  Oral mucosa pink and moist. Oropharynx without erythema, exudate, or swelling.  CV: RRR, no m/r/g.   LUNGS: CTA bilat, nonlabored resps, good aeration in all lung fields. EXT: 2+ pitting edema in both LL's.  Feet without rash or hyperkeratosis or any of the greyish patches of discoloration/hyperkeratosis that was there last visit.    LABS:  None today  IMPRESSION AND PLAN:  Heat exhaustion With recent dehydration, admission to hospital. Will obtain hosp records for review. He is well currently. Will check BMET and PT/INR today. Continue all meds at current doses.  DVT, lower extremity, recurrent Plus hx of PE.  Needs to be on coumadin as long as benefits continue to outweigh potential harm.  Ambulates with walker but does not fall. Lab Results  Component Value Date   INR 3.3*  12/30/2013   INR 2.8* 11/29/2013   INR 2.7* 10/30/2013   Will review hospital INRs. On 12/30/13 I had him hold one dose of coumadin and then resume same dosing regimen, with plan for recheck PT/INR in 2 wks, which would be today. Check PT/INR today.  An After Visit Summary was printed and given to the patient.  FOLLOW UP: Return in  about 4 months (around 05/16/2014) for routine chronic illness f/u.

## 2014-01-14 NOTE — Assessment & Plan Note (Addendum)
Plus hx of PE.  Needs to be on coumadin as long as benefits continue to outweigh potential harm.  Ambulates with walker but does not fall. Lab Results  Component Value Date   INR 3.3* 12/30/2013   INR 2.8* 11/29/2013   INR 2.7* 10/30/2013   Will review hospital INRs. On 12/30/13 I had him hold one dose of coumadin and then resume same dosing regimen, with plan for recheck PT/INR in 2 wks, which would be today. Check PT/INR today.

## 2014-01-14 NOTE — Progress Notes (Signed)
Pre visit review using our clinic review tool, if applicable. No additional management support is needed unless otherwise documented below in the visit note. 

## 2014-01-15 ENCOUNTER — Encounter: Payer: Self-pay | Admitting: Family Medicine

## 2014-01-20 ENCOUNTER — Other Ambulatory Visit (HOSPITAL_COMMUNITY): Payer: Self-pay | Admitting: Cardiology

## 2014-01-24 ENCOUNTER — Other Ambulatory Visit (INDEPENDENT_AMBULATORY_CARE_PROVIDER_SITE_OTHER): Payer: Medicare Other

## 2014-01-24 ENCOUNTER — Other Ambulatory Visit: Payer: Medicare Other

## 2014-01-24 DIAGNOSIS — Z86718 Personal history of other venous thrombosis and embolism: Secondary | ICD-10-CM

## 2014-01-25 LAB — PROTIME-INR
INR: 1.9 ratio — ABNORMAL HIGH (ref 0.8–1.0)
PROTHROMBIN TIME: 20.5 s — AB (ref 9.6–13.1)

## 2014-01-25 LAB — BASIC METABOLIC PANEL
BUN: 20 mg/dL (ref 6–23)
CO2: 28 meq/L (ref 19–32)
Calcium: 9.1 mg/dL (ref 8.4–10.5)
Chloride: 103 mEq/L (ref 96–112)
Creatinine, Ser: 1.9 mg/dL — ABNORMAL HIGH (ref 0.4–1.5)
GFR: 37.06 mL/min — AB (ref >60.00)
Glucose, Bld: 67 mg/dL — ABNORMAL LOW (ref 70–99)
POTASSIUM: 4.3 meq/L (ref 3.5–5.1)
SODIUM: 139 meq/L (ref 135–145)

## 2014-01-30 ENCOUNTER — Other Ambulatory Visit (HOSPITAL_COMMUNITY): Payer: Self-pay | Admitting: Cardiology

## 2014-02-05 ENCOUNTER — Other Ambulatory Visit: Payer: Medicare Other

## 2014-02-14 ENCOUNTER — Other Ambulatory Visit (INDEPENDENT_AMBULATORY_CARE_PROVIDER_SITE_OTHER): Payer: Medicare Other

## 2014-02-14 DIAGNOSIS — I82409 Acute embolism and thrombosis of unspecified deep veins of unspecified lower extremity: Secondary | ICD-10-CM

## 2014-02-14 LAB — BASIC METABOLIC PANEL
BUN: 22 mg/dL (ref 6–23)
CALCIUM: 8.9 mg/dL (ref 8.4–10.5)
CO2: 27 mEq/L (ref 19–32)
Chloride: 104 mEq/L (ref 96–112)
Creatinine, Ser: 1.7 mg/dL — ABNORMAL HIGH (ref 0.4–1.5)
GFR: 41.09 mL/min — ABNORMAL LOW (ref >60.00)
Glucose, Bld: 127 mg/dL — ABNORMAL HIGH (ref 70–99)
Potassium: 4.1 mEq/L (ref 3.5–5.1)
SODIUM: 139 meq/L (ref 135–145)

## 2014-02-14 LAB — PROTIME-INR
INR: 2 ratio — ABNORMAL HIGH (ref 0.8–1.0)
Prothrombin Time: 22.2 s — ABNORMAL HIGH (ref 9.6–13.1)

## 2014-02-26 ENCOUNTER — Other Ambulatory Visit (HOSPITAL_COMMUNITY): Payer: Self-pay | Admitting: Cardiology

## 2014-03-12 ENCOUNTER — Ambulatory Visit: Payer: Medicare Other | Admitting: Family Medicine

## 2014-03-14 ENCOUNTER — Other Ambulatory Visit (HOSPITAL_COMMUNITY): Payer: Self-pay | Admitting: Cardiology

## 2014-03-21 ENCOUNTER — Ambulatory Visit (INDEPENDENT_AMBULATORY_CARE_PROVIDER_SITE_OTHER): Payer: Medicare Other

## 2014-03-21 ENCOUNTER — Other Ambulatory Visit (INDEPENDENT_AMBULATORY_CARE_PROVIDER_SITE_OTHER): Payer: Medicare Other

## 2014-03-21 DIAGNOSIS — Z23 Encounter for immunization: Secondary | ICD-10-CM

## 2014-03-21 DIAGNOSIS — Z86718 Personal history of other venous thrombosis and embolism: Secondary | ICD-10-CM

## 2014-03-21 LAB — PROTIME-INR
INR: 2.3 ratio — ABNORMAL HIGH (ref 0.8–1.0)
Prothrombin Time: 25.3 s — ABNORMAL HIGH (ref 9.6–13.1)

## 2014-03-24 ENCOUNTER — Other Ambulatory Visit: Payer: Self-pay | Admitting: Family Medicine

## 2014-03-24 MED ORDER — INSULIN GLARGINE 100 UNIT/ML ~~LOC~~ SOLN: 30.0000 [IU] | Freq: Every morning | SUBCUTANEOUS | Status: DC

## 2014-03-31 ENCOUNTER — Other Ambulatory Visit: Payer: Self-pay | Admitting: Family Medicine

## 2014-03-31 MED ORDER — WARFARIN SODIUM 5 MG PO TABS: ORAL_TABLET | ORAL | Status: DC

## 2014-04-04 ENCOUNTER — Other Ambulatory Visit (HOSPITAL_COMMUNITY): Payer: Self-pay | Admitting: Cardiology

## 2014-04-07 NOTE — Telephone Encounter (Signed)
Please advise on refills. Patient is overdue for an appointment and the last refill was only for a two week supply. Patient still has not scheduled an appointment. Thanks, MI

## 2014-04-08 ENCOUNTER — Other Ambulatory Visit: Payer: Self-pay | Admitting: *Deleted

## 2014-04-08 MED ORDER — ATORVASTATIN CALCIUM 80 MG PO TABS: ORAL_TABLET | ORAL | Status: DC

## 2014-04-08 MED ORDER — ISOSORBIDE MONONITRATE ER 120 MG PO TB24: ORAL_TABLET | ORAL | Status: DC

## 2014-04-08 MED ORDER — CARVEDILOL 6.25 MG PO TABS: ORAL_TABLET | ORAL | Status: DC

## 2014-04-08 MED ORDER — HYDRALAZINE HCL 25 MG PO TABS: ORAL_TABLET | ORAL | Status: DC

## 2014-04-09 ENCOUNTER — Other Ambulatory Visit: Payer: Self-pay | Admitting: Cardiology

## 2014-05-06 ENCOUNTER — Other Ambulatory Visit: Payer: Medicare Other

## 2014-05-14 ENCOUNTER — Encounter: Payer: Self-pay | Admitting: Family Medicine

## 2014-05-14 ENCOUNTER — Other Ambulatory Visit: Payer: Medicare Other

## 2014-05-14 ENCOUNTER — Ambulatory Visit (INDEPENDENT_AMBULATORY_CARE_PROVIDER_SITE_OTHER): Payer: Medicare Other | Admitting: Family Medicine

## 2014-05-14 VITALS — BP 157/76 | HR 58 | Temp 98.2°F | Resp 18 | Ht 74.0 in | Wt 213.0 lb

## 2014-05-14 DIAGNOSIS — I878 Other specified disorders of veins: Secondary | ICD-10-CM

## 2014-05-14 DIAGNOSIS — N183 Chronic kidney disease, stage 3 unspecified: Secondary | ICD-10-CM

## 2014-05-14 DIAGNOSIS — I10 Essential (primary) hypertension: Secondary | ICD-10-CM

## 2014-05-14 DIAGNOSIS — E118 Type 2 diabetes mellitus with unspecified complications: Secondary | ICD-10-CM

## 2014-05-14 DIAGNOSIS — Z23 Encounter for immunization: Secondary | ICD-10-CM

## 2014-05-14 DIAGNOSIS — I82409 Acute embolism and thrombosis of unspecified deep veins of unspecified lower extremity: Secondary | ICD-10-CM

## 2014-05-14 LAB — PROTIME-INR
INR: 4.6 ratio — AB (ref 0.8–1.0)
PROTHROMBIN TIME: 48.6 s — AB (ref 9.6–13.1)

## 2014-05-14 NOTE — Progress Notes (Signed)
OFFICE VISIT  05/14/2014   CC:  Chief Complaint  Patient presents with  . Follow-up   HPI:    Patient is a 80 y.o. Caucasian male who presents for 4 mo f/u chronic medical problems: hx of recurrent LE DVT--on chronic/indefinite coumadin therapy, DM 2 with diab retinpthy and periph neurpthy, HTN, CRI stage 3, hyperlipidemia, chronic LE venous insufficiency edema.  Pt states he went to [facility] after passing out in a restaurant the first week of October.  I have no records of this. He states a lot of tests/labs were done and everything was normal.  He has cardiology f/u 06/12/13.    No home bp monitoring or glucose monitoring.  No CP. Has mild SOB when he walks but "no different than the last 2-3 yrs).  Has chronic LB pain from arthritis so this limits his walking. He ambulates with a rolling walker with a seat.  His last fall was about a year ago. He is not fasting today.   Past Medical History  Diagnosis Date  . Diabetes mellitus Onset approx 1980  . Hyperlipidemia   . Carotid artery occlusion   . Calf DVT (deep venous thrombosis) 2010    a. hx of right calf - chronic coumadin since 12/2008.  Recurrence shortly after d/c of coumadin 03/2013--coumadin restarted.  Marland Kitchen PVD (peripheral vascular disease)     ?Tibial dz? Re-eval 01/29/13 by Vascular MD concluded that no significant evidence of PAD was present.  . Coronary artery disease     a. s/p MI & CABG in 2000;  b. 04/2012 Cath/PTCA:  VG-> RI patent w/ 99 in native RI (PTCA only );  c.  LHC 09/20/12:  oLM 30%, mid LAD occluded, prox D1 80%, mid CFX 100, OM1 30, prox RCA 100, S-RI, native RI 80% (stable), S-OM patent, native OM2 100 (filled via R collats) - culprit, L-LAD patent, S-RCA mid 50%. - med rx; e. 11/2012 NSTEMI->med rx.  Marland Kitchen Hx pulmonary embolism 2010    a. bilateral;  b. Chronic coumadin since 12/2008  . Stroke 2010    [facility].  . Ischemic cardiomyopathy     a. 04/2012 Echo: EF 40-45%.;  b.  Echo  5/14: EF 60-65%, NL WM, Gr 1 DD  . Hypertension   . Osteoarthritis     Low back and left knee and both hips  . CKD (chronic kidney disease)     left sided chronic high grade obstruction on NM renal imaging (09/2011)--9% function compared to 90% function on right side.  . Nephrolithiasis     with left ureterolithiasis; hx of ureteral stent on left--has resulted in markedly decreased function of left kidney.  . Diabetic retinopathy associated with type 2 diabetes mellitus   . Squamous cell carcinoma 2008    on his back  . Insomnia   . History of pneumonia   . Diabetic peripheral neuropathy     was on neurontin at one time    Past Surgical History  Procedure Laterality Date  . Coronary artery bypass graft  2000    4 vessel cabg in [place], [place].  . Cardiac catheterization  04/2012    patent grafts, severe three-vessel CAD, severe diffuse small vessel disease and residual CAD. He underwent angioplasty alone to a 99% ramus intermedius lesion with resultant dissection and 70% restenosis.  . Carotid endarterectomy  2000    bilateral  . Lithotripsy      (Dr. Jeffie Pollock)  . Cardiac catheterization  09/2012  Occlusion of the native OM 2 with right-to-left collateral filling, prior balloon angioplasty site unchanged, SVG-RCA was unchanged, mildly elevated LVEDP  . Colonoscopy  2004    Dr. Ysidro Evert in [place], [place].  . Transthoracic echocardiogram  01/2014    Mild LVH, normal LV systolic fxn ([facility])  . Carot dopp  01/2014    1-39% sten of ICA's bilat ([facility])    Outpatient Prescriptions Prior to Visit  Medication Sig Dispense Refill  . aspirin 81 MG tablet Take 81 mg by mouth daily.    Marland Kitchen atorvastatin (LIPITOR) 80 MG tablet TAKE 1 TABLET BY MOUTH EVERY DAY 90 tablet 0  . carvedilol (COREG) 6.25 MG tablet TAKE 1 TABLET (6.25 MG TOTAL) BY MOUTH 2 (TWO) TIMES DAILY WITH A MEAL. 180 tablet 0  . diphenhydramine-acetaminophen (TYLENOL PM) 25-500 MG TABS Take 2 tablets  by mouth at bedtime.    . hydrALAZINE (APRESOLINE) 25 MG tablet TAKE 1 TABLET (25 MG TOTAL) BY MOUTH 2 (TWO) TIMES DAILY. 180 tablet 0  . insulin glargine (LANTUS) 100 UNIT/ML injection Inject 0.3 mLs (30 Units total) into the skin every morning. 10 mL 3  . isosorbide mononitrate (IMDUR) 120 MG 24 hr tablet TAKE 1 TABLET (120 MG TOTAL) BY MOUTH DAILY. 90 tablet 0  . nitroGLYCERIN (NITROSTAT) 0.4 MG SL tablet Place 0.4 mg under the tongue every 5 (five) minutes x 3 doses as needed for chest pain.    Marland Kitchen warfarin (COUMADIN) 5 MG tablet TAKE 1 TABLET (5 MG TOTAL) BY MOUTH EVERY EVENING. AS DIRECTED BY DR. Modena Morrow 120 tablet 1  . fish oil-omega-3 fatty acids 1000 MG capsule Take 2 g by mouth daily.     No facility-administered medications prior to visit.    Allergies  Allergen Reactions  . Doxycycline     swelling  . Morphine And Related     NAUSEA    . Niacin And Related Other (See Comments)    Hot flashes    ROS As per HPI  PE: Blood pressure 157/76, pulse 58, temperature 98.2 F (36.8 C), temperature source Temporal, resp. rate 18, height 6\' 2"  (1.88 m), weight 213 lb (96.616 kg), SpO2 92 %. Gen: Alert, well appearing.  Patient is oriented to person, place, time, and situation. AFFECT: pleasant, lucid thought and speech. CV: RRR, no m/r/g.   LUNGS: CTA bilat, nonlabored resps, good aeration in all lung fields. EXT: no clubbing or cyanosis.  2+ pitting edema bilat with chronic hemosiderin pigment changes in skin--scattered in throughout LL's.  No erythema or tenderness.  LABS:  None today  IMPRESSION AND PLAN:  1) DM 2, with complications.   Check HbA1c today. Tdap today.  2) Recurrent LE DVT: on coumadin indefinitely.  PT/INR today.  3) HTN; The current medical regimen is effective;  continue present plan and medications.  4) CRI, stage III. Will draw extra tube of blood today in case we need to check BMET, but will get records from October hospitalization at [facility] and  check the labs at that time as well.  5) Hyperlipidemia: tolerating statin.  Not fasting today.  Will look at labs from October hospitalization to see if FLP was done.  6) CAD; asymptomatic.  Keep f/u with cardiologist set for 06/12/13.  Pt states that his cardiologist has told him that he definitely does want him to keep taking his 81mg  ASA while on coumadin.  An After Visit Summary was printed and given to the patient.  FOLLOW UP:  6 mo chronic illness f/u

## 2014-05-14 NOTE — Progress Notes (Signed)
Pre visit review using our clinic review tool, if applicable. No additional management support is needed unless otherwise documented below in the visit note. 

## 2014-05-15 ENCOUNTER — Encounter (HOSPITAL_COMMUNITY): Payer: Self-pay | Admitting: Cardiovascular Disease

## 2014-05-27 ENCOUNTER — Other Ambulatory Visit (INDEPENDENT_AMBULATORY_CARE_PROVIDER_SITE_OTHER): Payer: Medicare Other

## 2014-05-27 DIAGNOSIS — I482 Chronic atrial fibrillation, unspecified: Secondary | ICD-10-CM

## 2014-05-27 LAB — PROTIME-INR
INR: 1.5 ratio — ABNORMAL HIGH (ref 0.8–1.0)
Prothrombin Time: 16.5 s — ABNORMAL HIGH (ref 9.6–13.1)

## 2014-06-02 ENCOUNTER — Other Ambulatory Visit: Payer: Medicare Other

## 2014-06-03 ENCOUNTER — Telehealth: Payer: Self-pay | Admitting: Family Medicine

## 2014-06-03 NOTE — Telephone Encounter (Signed)
Please call pt with Lab results.?[facility]

## 2014-06-12 ENCOUNTER — Encounter: Payer: Self-pay | Admitting: Cardiology

## 2014-06-12 ENCOUNTER — Ambulatory Visit (INDEPENDENT_AMBULATORY_CARE_PROVIDER_SITE_OTHER): Payer: Medicare Other | Admitting: Cardiology

## 2014-06-12 VITALS — BP 130/66 | HR 57 | Ht 73.5 in | Wt 208.8 lb

## 2014-06-12 DIAGNOSIS — E7800 Pure hypercholesterolemia, unspecified: Secondary | ICD-10-CM

## 2014-06-12 DIAGNOSIS — I259 Chronic ischemic heart disease, unspecified: Secondary | ICD-10-CM

## 2014-06-12 DIAGNOSIS — I10 Essential (primary) hypertension: Secondary | ICD-10-CM

## 2014-06-12 NOTE — Progress Notes (Signed)
Wesley [name] Date of Birth:  [birth year] [facility] [address] [place], [place]  [address] [phone]        Fax   [phone]   History of Present Illness: Wesley [name] is a 81 y.o. male who returns for a one-year follow-up office visit.  He has a history of known ischemic heart disease.  He was admitted to the hospital 4/15-4/18 with a non-STEMI. He has a hx of CAD, status post MI followed by CABG in 2000, angioplasty to the native ramus intermediate 04/2012, ischemic cardiomyopathy, DM2, HL, CKD, carotid stenosis, prior stroke, HTN, PAD, prior DVT/bilateral pulmonary emboli. For the past 2-1/2 years he has been on chronic Coumadin. Patient was admitted in 04/2012 with unstable angina. He had an angioplasty of the ramus intermediate with resultant dissection and 70% restenosis. Echo 04/2012: EF 40-45%. He was readmitted 4/15 with complaints of sudden onset left-sided chest pressure. LHC 09/20/12: oLM 30%, mid LAD occluded, prox D1 80%, mid CFX occluded, OM1 30%, proximal RCA occluded, SVG-ramus patent, native ramus 80% (stable from previous study), SVG-OM patent, native OM2 occluded (filled via right collaterals), LIMA-LAD patent, SVG-RCA mid 50%. Culprit for non-STEMI was a newly occluded OM2 with R->L collaterals. Otherwise anatomy was stable and medical therapy was recommended. He is not on ACE due to CKD. He has been placed on hydralazine and nitrates. His Pravastatin was changed to atorvastatin. F/u echo demonstrates recovered LVF (EF 60-65%). He is doing well since d/c. No further chest pain. Has poor balance and decreased energy since his prior stroke. Denies significant dyspnea. No orthopnea, PND, edema. No syncope. He was seen by Dr. Fletcher Anon in August 2014 who felt that the patient did not have any significant peripheral arterial occlusive disease. His ankle-brachial indices were almost normal. No further evaluation was indicated and the patient feels that his  leg weakness probably is a result of his old stroke. He walks with a cane. The patient had an episode of syncope in October.  It was associated with dehydration.  He was observed overnight and treated with IV fluids and released the next day.  The patient has not had any recurrent stroke symptoms.  He has not been experiencing any chest pain or evidence of myocardial ischemia.  He has not had any recurrence of deep vein thrombosis.  He remains on long-term warfarin.  This is followed at his PCP office.   Current Outpatient Prescriptions  Medication Sig Dispense Refill  . atorvastatin (LIPITOR) 80 MG tablet TAKE 1 TABLET BY MOUTH EVERY DAY 90 tablet 0  . carvedilol (COREG) 6.25 MG tablet TAKE 1 TABLET (6.25 MG TOTAL) BY MOUTH 2 (TWO) TIMES DAILY WITH A MEAL. 180 tablet 0  . diphenhydramine-acetaminophen (TYLENOL PM) 25-500 MG TABS Take 2 tablets by mouth at bedtime.    . fish oil-omega-3 fatty acids 1000 MG capsule Take 2 g by mouth daily.    . hydrALAZINE (APRESOLINE) 25 MG tablet TAKE 1 TABLET (25 MG TOTAL) BY MOUTH 2 (TWO) TIMES DAILY. 180 tablet 0  . insulin glargine (LANTUS) 100 UNIT/ML injection Inject 0.3 mLs (30 Units total) into the skin every morning. 10 mL 3  . isosorbide mononitrate (IMDUR) 120 MG 24 hr tablet TAKE 1 TABLET (120 MG TOTAL) BY MOUTH DAILY. 90 tablet 0  . nitroGLYCERIN (NITROSTAT) 0.4 MG SL tablet Place 0.4 mg under the tongue every 5 (five) minutes x 3 doses as needed for chest pain.    Marland Kitchen warfarin (COUMADIN) 5  MG tablet TAKE 1 TABLET (5 MG TOTAL) BY MOUTH EVERY EVENING. AS DIRECTED BY DR. Modena Morrow 120 tablet 1   No current facility-administered medications for this visit.    Allergies  Allergen Reactions  . Doxycycline     swelling  . Morphine And Related     NAUSEA    . Niacin And Related Other (See Comments)    Hot flashes    Patient Active Problem List   Diagnosis Date Noted  . Heat exhaustion 01/14/2014  . Lower extremity venous stasis 12/30/2013  . Tinea  pedis 12/30/2013  . DVT, lower extremity, recurrent 07/14/2013  . Diabetes mellitus with complication 24/23/5361  . History of pulmonary embolism 01/08/2013  . History of DVT (deep vein thrombosis) 01/08/2013  . Constipation, acute 01/07/2013  . NSTEMI (non-ST elevated myocardial infarction) 09/21/2012  . Dyslipidemia 09/21/2012  . Elevated TSH 09/21/2012  . Angina pectoris, unstable 04/29/2012  . History of CVA (cerebrovascular accident) 04/29/2012  . CKD (chronic kidney disease), stage III 04/29/2012  . Essential hypertension 04/29/2012  . Kidney stone 02/28/2011  . Calf DVT (deep venous thrombosis)   . PVD (peripheral vascular disease)   . Coronary artery disease   . Hx pulmonary embolism     History  Smoking status  . Never Smoker   Smokeless tobacco  . Never Used    History  Alcohol Use No    Family History  Problem Relation Age of Onset  . Stroke Mother     age 57  . Other Father     pulmonary embolus age 1  . Diabetes Sister   . Stroke Sister   . Diabetes Brother     brother was murdered  . Diabetes Sister     Review of Systems: Constitutional: no fever chills diaphoresis or fatigue or change in weight.  Head and neck: no hearing loss, no epistaxis, no photophobia or visual disturbance. Respiratory: No cough, shortness of breath or wheezing. Cardiovascular: No chest pain peripheral edema, palpitations. Gastrointestinal: No abdominal distention, no abdominal pain, no change in bowel habits hematochezia or melena. Genitourinary: No dysuria, no frequency, no urgency, no nocturia. Musculoskeletal:No arthralgias, no back pain, no gait disturbance or myalgias. Neurological: No dizziness, no headaches, no numbness, no seizures, no syncope, no weakness, no tremors. Hematologic: No lymphadenopathy, no easy bruising. Psychiatric: No confusion, no hallucinations, no sleep disturbance.   Wt Readings from Last 3 Encounters:  06/12/14 208 lb 12.8 oz (94.711 kg)    05/14/14 213 lb (96.616 kg)  01/14/14 209 lb (94.802 kg)    Physical Exam: Filed Vitals:   06/12/14 1050  BP: 130/66  Pulse: 57  The patient appears to be in no distress.  Head and neck exam reveals that the pupils are equal and reactive.  The extraocular movements are full.  There is no scleral icterus.  Mouth and pharynx are benign.  No lymphadenopathy.  No carotid bruits.  The jugular venous pressure is normal.  Thyroid is not enlarged or tender.  Chest is clear to percussion and auscultation.  No rales or rhonchi.  Expansion of the chest is symmetrical.  Heart reveals no abnormal lift or heave.  First and second heart sounds are normal.  There is no murmur gallop rub or click.  The abdomen is soft and nontender.  Bowel sounds are normoactive.  There is no hepatosplenomegaly or mass.  There are no abdominal bruits.  Extremities reveal no phlebitis or edema.  Pedal pulses are good.  There is no  cyanosis or clubbing.  Neurologic exam is normal strength and no lateralizing weakness.  No sensory deficits.  Integument reveals no rash   Assessment / Plan: 1.  Ischemic heart disease.  Stable.  The patient has not been experiencing any recurrent chest pain or angina. 2.  Hypercholesterolemia.  The patient is on Lipitor and omega-3 fatty acids.  His lipids are followed by his PCP. 3.  Essential hypertension without heart failure.  Blood pressures remained stable on current therapy 4.  History of prior strokes and prior DVT with pulmonary emboli.  On long-term anticoagulation.  On warfarin.  Plan: Continue warfarin.  He may stop his baby aspirin now. Recheck in one year for follow-up office visit and EKG.

## 2014-06-12 NOTE — Patient Instructions (Signed)
STOP ASPIRIN   Your physician wants you to follow-up in: [place] will receive a reminder letter in the mail two months in advance. If you don't receive a letter, please call our office to schedule the follow-up appointment.

## 2014-06-18 ENCOUNTER — Other Ambulatory Visit (INDEPENDENT_AMBULATORY_CARE_PROVIDER_SITE_OTHER): Payer: Medicare Other

## 2014-06-18 ENCOUNTER — Other Ambulatory Visit: Payer: Medicare Other

## 2014-06-18 DIAGNOSIS — I82409 Acute embolism and thrombosis of unspecified deep veins of unspecified lower extremity: Secondary | ICD-10-CM | POA: Diagnosis not present

## 2014-06-18 LAB — PROTIME-INR
INR: 3 ratio — ABNORMAL HIGH (ref 0.8–1.0)
Prothrombin Time: 32.5 s — ABNORMAL HIGH (ref 9.6–13.1)

## 2014-06-24 ENCOUNTER — Other Ambulatory Visit: Payer: Self-pay | Admitting: Family Medicine

## 2014-06-24 ENCOUNTER — Other Ambulatory Visit: Payer: Self-pay | Admitting: Cardiology

## 2014-06-24 DIAGNOSIS — E7800 Pure hypercholesterolemia, unspecified: Secondary | ICD-10-CM

## 2014-06-24 MED ORDER — WARFARIN SODIUM 5 MG PO TABS: ORAL_TABLET | ORAL | Status: DC

## 2014-06-25 NOTE — Telephone Encounter (Signed)
Ok to refill or should this be sent to pcp as they manage his lipids? Please advise. Thanks, MI

## 2014-07-02 ENCOUNTER — Other Ambulatory Visit: Payer: Medicare Other

## 2014-07-02 ENCOUNTER — Other Ambulatory Visit (INDEPENDENT_AMBULATORY_CARE_PROVIDER_SITE_OTHER): Payer: Medicare Other

## 2014-07-02 ENCOUNTER — Encounter: Payer: Self-pay | Admitting: Family Medicine

## 2014-07-02 VITALS — BP 143/75 | HR 65 | Temp 97.8°F | Ht 74.0 in | Wt 208.0 lb

## 2014-07-02 DIAGNOSIS — Z86711 Personal history of pulmonary embolism: Secondary | ICD-10-CM

## 2014-07-02 LAB — PROTIME-INR
INR: 2.8 ratio — ABNORMAL HIGH (ref 0.8–1.0)
PROTHROMBIN TIME: 30.6 s — AB (ref 9.6–13.1)

## 2014-07-02 NOTE — Progress Notes (Deleted)
Pre visit review using our clinic review tool, if applicable. No additional management support is needed unless otherwise documented below in the visit note. 

## 2014-07-03 ENCOUNTER — Ambulatory Visit: Payer: Medicare Other | Admitting: Family Medicine

## 2014-08-04 ENCOUNTER — Encounter: Payer: Self-pay | Admitting: Family Medicine

## 2014-08-04 ENCOUNTER — Ambulatory Visit (INDEPENDENT_AMBULATORY_CARE_PROVIDER_SITE_OTHER): Payer: Medicare Other | Admitting: Family Medicine

## 2014-08-04 VITALS — BP 154/80 | HR 64 | Temp 97.8°F | Resp 18 | Ht 74.0 in | Wt 208.0 lb

## 2014-08-04 DIAGNOSIS — L989 Disorder of the skin and subcutaneous tissue, unspecified: Secondary | ICD-10-CM

## 2014-08-04 NOTE — Progress Notes (Signed)
OFFICE NOTE  08/04/2014  CC:  Chief Complaint  Patient presents with  . Mass    x 2 weeks on R side of face     HPI: Patient is a 81 y.o. Caucasian male who is here for lesion on right side of face. Noted lesion abruptly came up about 2 wks ago, anterior to right ear on face. No itching or pain.  He has noted that it has actually shrunk some the last few days, also has noted some dried drainage/weeping on his pillow in the mornings lately.     Pertinent PMH:  Past medical, surgical, social, and family history reviewed and no changes are noted since last office visit.  MEDS:  Outpatient Prescriptions Prior to Visit  Medication Sig Dispense Refill  . atorvastatin (LIPITOR) 80 MG tablet TAKE 1 TABLET BY MOUTH EVERY DAY 90 tablet 0  . carvedilol (COREG) 6.25 MG tablet TAKE 1 TABLET (6.25 MG TOTAL) BY MOUTH 2 (TWO) TIMES DAILY WITH A MEAL. 180 tablet 3  . diphenhydramine-acetaminophen (TYLENOL PM) 25-500 MG TABS Take 2 tablets by mouth at bedtime.    . fish oil-omega-3 fatty acids 1000 MG capsule Take 2 g by mouth daily.    . hydrALAZINE (APRESOLINE) 25 MG tablet TAKE 1 TABLET (25 MG TOTAL) BY MOUTH 2 (TWO) TIMES DAILY. 180 tablet 3  . insulin glargine (LANTUS) 100 UNIT/ML injection Inject 0.3 mLs (30 Units total) into the skin every morning. 10 mL 3  . isosorbide mononitrate (IMDUR) 120 MG 24 hr tablet TAKE 1 TABLET (120 MG TOTAL) BY MOUTH DAILY. 90 tablet 3  . nitroGLYCERIN (NITROSTAT) 0.4 MG SL tablet Place 0.4 mg under the tongue every 5 (five) minutes x 3 doses as needed for chest pain.    Marland Kitchen warfarin (COUMADIN) 5 MG tablet TAKE 1 TABLET (5 MG TOTAL) BY MOUTH EVERY EVENING. AS DIRECTED BY DR. Modena Morrow 120 tablet 1   No facility-administered medications prior to visit.    PE: Blood pressure 154/80, pulse 64, temperature 97.8 F (36.6 C), temperature source Temporal, resp. rate 18, height 6\' 2"  (1.88 m), weight 208 lb (94.348 kg), SpO2 98 %. Gen: Alert, well appearing.  Patient is  oriented to person, place, time, and situation. Face: in right preauricular region of face he has a circular nodule that is 1.5 cm in diameter, about 2-3 mm in height, deep pink hue, central aspect is very superficially ulcerated but no active drainage.  The lesion is not fluctuant or hard.  Nontender.  IMPRESSION AND PLAN:  New facial nodule, suspicious for BCC or SCC. Cover, no specific meds recommended at this time: just keep the lesion clean and dry. Refer to skin surgery center in [place] for removal of lesion.  An After Visit Summary was printed and given to the patient.  FOLLOW UP: prn

## 2014-08-04 NOTE — Progress Notes (Signed)
Pre visit review using our clinic review tool, if applicable. No additional management support is needed unless otherwise documented below in the visit note. 

## 2014-08-15 ENCOUNTER — Other Ambulatory Visit: Payer: Self-pay | Admitting: Family Medicine

## 2014-08-15 MED ORDER — INSULIN GLARGINE 100 UNIT/ML ~~LOC~~ SOLN: 30.0000 [IU] | Freq: Every morning | SUBCUTANEOUS | Status: DC

## 2014-08-18 ENCOUNTER — Other Ambulatory Visit: Payer: Self-pay | Admitting: Family Medicine

## 2014-08-18 MED ORDER — INSULIN GLARGINE 100 UNIT/ML ~~LOC~~ SOLN: 30.0000 [IU] | Freq: Every morning | SUBCUTANEOUS | Status: DC

## 2014-08-20 DIAGNOSIS — D485 Neoplasm of uncertain behavior of skin: Secondary | ICD-10-CM | POA: Diagnosis not present

## 2014-08-20 DIAGNOSIS — D1801 Hemangioma of skin and subcutaneous tissue: Secondary | ICD-10-CM | POA: Diagnosis not present

## 2014-08-20 DIAGNOSIS — C44329 Squamous cell carcinoma of skin of other parts of face: Secondary | ICD-10-CM | POA: Diagnosis not present

## 2014-08-20 DIAGNOSIS — L821 Other seborrheic keratosis: Secondary | ICD-10-CM | POA: Diagnosis not present

## 2014-08-20 DIAGNOSIS — C44319 Basal cell carcinoma of skin of other parts of face: Secondary | ICD-10-CM | POA: Diagnosis not present

## 2014-08-26 ENCOUNTER — Other Ambulatory Visit (INDEPENDENT_AMBULATORY_CARE_PROVIDER_SITE_OTHER): Payer: Medicare Other

## 2014-08-26 DIAGNOSIS — Z86711 Personal history of pulmonary embolism: Secondary | ICD-10-CM

## 2014-08-26 LAB — PROTIME-INR
INR: 2.6 ratio — ABNORMAL HIGH (ref 0.8–1.0)
Prothrombin Time: 28.4 s — ABNORMAL HIGH (ref 9.6–13.1)

## 2014-09-04 DIAGNOSIS — C44329 Squamous cell carcinoma of skin of other parts of face: Secondary | ICD-10-CM | POA: Diagnosis not present

## 2014-09-28 ENCOUNTER — Other Ambulatory Visit: Payer: Self-pay | Admitting: Cardiology

## 2014-09-30 DIAGNOSIS — K5900 Constipation, unspecified: Secondary | ICD-10-CM | POA: Diagnosis not present

## 2014-10-01 ENCOUNTER — Emergency Department (HOSPITAL_COMMUNITY)
Admission: EM | Admit: 2014-10-01 | Discharge: 2014-10-01 | Disposition: A | Discharge status: Home / Self Care | Payer: Medicare Other | Attending: Emergency Medicine | Admitting: Emergency Medicine

## 2014-10-01 ENCOUNTER — Emergency Department (HOSPITAL_COMMUNITY): Payer: Medicare Other

## 2014-10-01 ENCOUNTER — Encounter (HOSPITAL_COMMUNITY): Payer: Self-pay | Admitting: *Deleted

## 2014-10-01 DIAGNOSIS — E11319 Type 2 diabetes mellitus with unspecified diabetic retinopathy without macular edema: Secondary | ICD-10-CM | POA: Diagnosis not present

## 2014-10-01 DIAGNOSIS — Z8673 Personal history of transient ischemic attack (TIA), and cerebral infarction without residual deficits: Secondary | ICD-10-CM | POA: Insufficient documentation

## 2014-10-01 DIAGNOSIS — Z79899 Other long term (current) drug therapy: Secondary | ICD-10-CM | POA: Insufficient documentation

## 2014-10-01 DIAGNOSIS — N189 Chronic kidney disease, unspecified: Secondary | ICD-10-CM | POA: Insufficient documentation

## 2014-10-01 DIAGNOSIS — Z86711 Personal history of pulmonary embolism: Secondary | ICD-10-CM | POA: Diagnosis not present

## 2014-10-01 DIAGNOSIS — K5641 Fecal impaction: Secondary | ICD-10-CM | POA: Insufficient documentation

## 2014-10-01 DIAGNOSIS — K5900 Constipation, unspecified: Secondary | ICD-10-CM | POA: Diagnosis not present

## 2014-10-01 DIAGNOSIS — E1140 Type 2 diabetes mellitus with diabetic neuropathy, unspecified: Secondary | ICD-10-CM | POA: Diagnosis not present

## 2014-10-01 DIAGNOSIS — I129 Hypertensive chronic kidney disease with stage 1 through stage 4 chronic kidney disease, or unspecified chronic kidney disease: Secondary | ICD-10-CM | POA: Diagnosis not present

## 2014-10-01 DIAGNOSIS — Z7901 Long term (current) use of anticoagulants: Secondary | ICD-10-CM | POA: Diagnosis not present

## 2014-10-01 DIAGNOSIS — Z86718 Personal history of other venous thrombosis and embolism: Secondary | ICD-10-CM | POA: Insufficient documentation

## 2014-10-01 DIAGNOSIS — Z794 Long term (current) use of insulin: Secondary | ICD-10-CM | POA: Insufficient documentation

## 2014-10-01 DIAGNOSIS — Z87442 Personal history of urinary calculi: Secondary | ICD-10-CM | POA: Insufficient documentation

## 2014-10-01 DIAGNOSIS — Z85828 Personal history of other malignant neoplasm of skin: Secondary | ICD-10-CM | POA: Diagnosis not present

## 2014-10-01 DIAGNOSIS — E785 Hyperlipidemia, unspecified: Secondary | ICD-10-CM | POA: Insufficient documentation

## 2014-10-01 DIAGNOSIS — N200 Calculus of kidney: Secondary | ICD-10-CM | POA: Diagnosis not present

## 2014-10-01 DIAGNOSIS — I2510 Atherosclerotic heart disease of native coronary artery without angina pectoris: Secondary | ICD-10-CM | POA: Diagnosis not present

## 2014-10-01 DIAGNOSIS — N261 Atrophy of kidney (terminal): Secondary | ICD-10-CM | POA: Diagnosis not present

## 2014-10-01 DIAGNOSIS — R109 Unspecified abdominal pain: Secondary | ICD-10-CM

## 2014-10-01 DIAGNOSIS — R1084 Generalized abdominal pain: Secondary | ICD-10-CM | POA: Diagnosis not present

## 2014-10-01 DIAGNOSIS — Z8739 Personal history of other diseases of the musculoskeletal system and connective tissue: Secondary | ICD-10-CM | POA: Insufficient documentation

## 2014-10-01 DIAGNOSIS — K599 Functional intestinal disorder, unspecified: Secondary | ICD-10-CM | POA: Diagnosis not present

## 2014-10-01 LAB — PROTIME-INR
INR: 2.11 — AB (ref 0.00–1.49)
PROTHROMBIN TIME: 23.8 s — AB (ref 11.6–15.2)

## 2014-10-01 LAB — URINALYSIS, ROUTINE W REFLEX MICROSCOPIC
Glucose, UA: NEGATIVE mg/dL
Hgb urine dipstick: NEGATIVE
KETONES UR: 15 mg/dL — AB
LEUKOCYTES UA: NEGATIVE
NITRITE: NEGATIVE
PROTEIN: 100 mg/dL — AB
Specific Gravity, Urine: 1.022 (ref 1.005–1.030)
Urobilinogen, UA: 1 mg/dL (ref 0.0–1.0)
pH: 5.5 (ref 5.0–8.0)

## 2014-10-01 LAB — URINE MICROSCOPIC-ADD ON

## 2014-10-01 LAB — CBC WITH DIFFERENTIAL/PLATELET
Basophils Absolute: 0 10*3/uL (ref 0.0–0.1)
Basophils Relative: 0 % (ref 0–1)
EOS ABS: 0.1 10*3/uL (ref 0.0–0.7)
EOS PCT: 1 % (ref 0–5)
HEMATOCRIT: 38.2 % — AB (ref 39.0–52.0)
HEMOGLOBIN: 12.9 g/dL — AB (ref 13.0–17.0)
Lymphocytes Relative: 15 % (ref 12–46)
Lymphs Abs: 1.9 10*3/uL (ref 0.7–4.0)
MCH: 30.3 pg (ref 26.0–34.0)
MCHC: 33.8 g/dL (ref 30.0–36.0)
MCV: 89.7 fL (ref 78.0–100.0)
MONO ABS: 0.6 10*3/uL (ref 0.1–1.0)
MONOS PCT: 5 % (ref 3–12)
Neutro Abs: 10.1 10*3/uL — ABNORMAL HIGH (ref 1.7–7.7)
Neutrophils Relative %: 79 % — ABNORMAL HIGH (ref 43–77)
Platelets: 148 10*3/uL — ABNORMAL LOW (ref 150–400)
RBC: 4.26 MIL/uL (ref 4.22–5.81)
RDW: 13.5 % (ref 11.5–15.5)
WBC: 12.7 10*3/uL — ABNORMAL HIGH (ref 4.0–10.5)

## 2014-10-01 LAB — COMPREHENSIVE METABOLIC PANEL
ALBUMIN: 3.2 g/dL — AB (ref 3.5–5.2)
ALT: 15 U/L (ref 0–53)
AST: 24 U/L (ref 0–37)
Alkaline Phosphatase: 86 U/L (ref 39–117)
Anion gap: 10 (ref 5–15)
BUN: 17 mg/dL (ref 6–23)
CALCIUM: 8.7 mg/dL (ref 8.4–10.5)
CO2: 25 mmol/L (ref 19–32)
Chloride: 103 mmol/L (ref 96–112)
Creatinine, Ser: 1.51 mg/dL — ABNORMAL HIGH (ref 0.50–1.35)
GFR calc Af Amer: 48 mL/min — ABNORMAL LOW (ref >90)
GFR calc non Af Amer: 42 mL/min — ABNORMAL LOW (ref >90)
Glucose, Bld: 199 mg/dL — ABNORMAL HIGH (ref 70–99)
POTASSIUM: 3.9 mmol/L (ref 3.5–5.1)
SODIUM: 138 mmol/L (ref 135–145)
TOTAL PROTEIN: 6.5 g/dL (ref 6.0–8.3)
Total Bilirubin: 0.6 mg/dL (ref 0.3–1.2)

## 2014-10-01 MED ORDER — IOHEXOL 300 MG/ML  SOLN
25.0000 mL | Freq: Once | INTRAMUSCULAR | Status: AC | PRN
  Administered 2014-10-01: 25 mL via ORAL

## 2014-10-01 MED ORDER — IOHEXOL 300 MG/ML  SOLN
80.0000 mL | Freq: Once | INTRAMUSCULAR | Status: AC | PRN
  Administered 2014-10-01: 80 mL via INTRAVENOUS

## 2014-10-01 MED ORDER — POLYETHYLENE GLYCOL 3350 17 G PO PACK: 17.0000 g | PACK | Freq: Every day | ORAL | Status: AC

## 2014-10-01 NOTE — Clinical Social Work Note (Signed)
Clinical Social Work Assessment  Patient Details  Name: Wesley [name] MRN: [id number] Date of Birth: [birth year]  Date of referral:  10/01/14               Reason for consult:  Facility Placement, [employer], Other (Comment Required) (home health)                Permission sought to share information with:  Case Manager, Family Supports, Chartered certified accountant granted to share information::  Yes, Verbal Permission Granted  Name::     Water engineer and Hilda Blades (daughters)  Agency::  Home Health Agency: [facility]  Relationship::  Daughters  Contact Information:  Working with Water engineer (daughter)  Housing/Transportation Living arrangements for the past 2 months:  Single Family Home Source of Information:  Patient, Adult Children Patient Interpreter Needed:  None Criminal Activity/Legal Involvement Pertinent to Current Situation/Hospitalization:  No - Comment as needed Significant Relationships:  Adult Children, Other(Comment), Neighbor (Wife passed away 4 years ago) Lives with:  Self Do you feel safe going back to the place where you live?  Yes Need for family participation in patient care:  Yes (Comment) (family wanting more information about SNF and HH)  Care giving concerns:  LCSW met with patient at the bedside. Patient very cooperative, hard of hearing, but engaged in assessment. Aware of reason for consult reports "my daughters are worried about me and want me to go into a rest home, but I am doing fine at home and want to return".  Patient reports he lives alone in [place], retired, and has been in [place] for the last 10 years.  He was the sole caregiver of his wife (who passed 4 years ago from tobacco abuse) and now lives alone.  Reports he has a regular routine with his morning breakfast, news, sports, and getting the mail.  Reports he is happy at home, aware he is getting weaker and daughters concerned about his ADLs and functionality.  Reports he only  drives to the grocery store which is right up the street, does not go out, and mainly keeps to himself.  He is not depressed or tearful, very happy and engaged.  Daughters very supportive however one lives in [place], one in [place], and one in [place].  Per daughter [name]) in [place] she reports at this time they are not in a hurry to place patient in a nursing home and would like home health.  LCSW explained education behind Home health vs SNF, long term medicaid, and managed medicare.  Daughter has 2 faculties she is interested in, but wants to make contact with facilities prior to direct admits.  Agreeable to Home health referral.   Social Worker assessment / plan:  At this time patient wants to go home is A+O x4.  He reports he is doing well at home, aware of his daughters concerns and will consider a SNF, but at this time is not interested.  Daughter called [name]) who reports she is agreeable for patient to go home, another daughter Hilda Blades will come to the hospital after work and pick patient up.  Interested in home health referral.  Patient still not medically cleared, but once labs and scans are complete he will DC home.  LCSW has daughter emails and will send information about SNF, medicaid application, and home health.  Will follow up with daughter regarding home health and the agency of their choosing.  Patient is inconsistent with what he wants to do regarding home  health and SNF.  Patient has meals on wheels coming for lunch, relatives, friends, that come and check on him.  Home health to be referred to assist as needed.  Employment status:  Retired Nurse, adult PT Recommendations:  Not assessed at this time Information / Referral to community resources:  [address], Other (Comment Required) (home health ageny)  Patient/Family's Response to care:  Daughter reports understanding of care plan.  Patient is inconsistent at this time with  letting people into his home with home health and is not wanting SNF.  Daughter to call patient and assist with needs related to community care of patient.  Patient/Family's Understanding of and Emotional Response to Diagnosis, Current Treatment, and Prognosis:  Patient cooperative and pleasant during assessment.  Patient understands current medical treatment however ambivalent to his prognosis and striving to maintain autonomy in the community and his independence.  Daughters voicing concern and safety of patient as he has had a stroke and walking has become unsteady with gait.  Daughter reports she is aware patient is going downhill fast with living alone may not be in option in the near future.  Emotional Assessment Appearance:  Appears stated age Attitude/Demeanor/Rapport:  Other (Cooperative and Compliant) Affect (typically observed):  Accepting, Adaptable, Appropriate Orientation:  Oriented to Self, Oriented to Place, Oriented to  Time, Oriented to Situation Alcohol / Substance use:  Never Used Psych involvement (Current and /or in the community):  No (Comment)  Discharge Needs  Concerns to be addressed:  Adjustment to Illness, Basic Needs, Care Coordination Readmission within the last 30 days:  No Current discharge risk:  Lives alone Barriers to Discharge:  Barriers Resolved   [place], LCSW 10/01/2014, 9:28 AM

## 2014-10-01 NOTE — ED Notes (Signed)
Pt in via EMS to triage c/o constipation for the last 5 days, decreased appetite, denies n/v, c/o abd pain and distention

## 2014-10-01 NOTE — Discharge Instructions (Signed)
Take 6 capfuls (or packets) today (either first thing in the morning or immediately before bed).  Abdominal Pain Many things can cause abdominal pain. Usually, abdominal pain is not caused by a disease and will improve without treatment. It can often be observed and treated at home. Your health care provider will do a physical exam and possibly order blood tests and X-rays to help determine the seriousness of your pain. However, in many cases, more time must pass before a clear cause of the pain can be found. Before that point, your health care provider may not know if you need more testing or further treatment. HOME CARE INSTRUCTIONS  Monitor your abdominal pain for any changes. The following actions may help to alleviate any discomfort you are experiencing:  Only take over-the-counter or prescription medicines as directed by your health care provider.  Do not take laxatives unless directed to do so by your health care provider.  Try a clear liquid diet (broth, tea, or water) as directed by your health care provider. Slowly move to a bland diet as tolerated. SEEK MEDICAL CARE IF:  You have unexplained abdominal pain.  You have abdominal pain associated with nausea or diarrhea.  You have pain when you urinate or have a bowel movement.  You experience abdominal pain that wakes you in the night.  You have abdominal pain that is worsened or improved by eating food.  You have abdominal pain that is worsened with eating fatty foods.  You have a fever. SEEK IMMEDIATE MEDICAL CARE IF:   Your pain does not go away within 2 hours.  You keep throwing up (vomiting).  Your pain is felt only in portions of the abdomen, such as the right side or the left lower portion of the abdomen.  You pass bloody or black tarry stools. MAKE SURE YOU:  Understand these instructions.   Will watch your condition.   Will get help right away if you are not doing well or get worse.  Document Released:  03/02/2005 Document Revised: 05/28/2013 Document Reviewed: 01/30/2013 [facility] Patient Information 2015 [place], [place]. This information is not intended to replace advice given to you by your health care provider. Make sure you discuss any questions you have with your health care provider. Fecal Impaction A fecal impaction happens when there is a large, firm amount of stool (or feces) that cannot be passed. The impacted stool is usually in the rectum, which is the lowest part of the large bowel. The impacted stool can block the colon and cause significant problems. CAUSES  The longer stool stays in the rectum, the harder it gets. Anything that slows down your bowel movements can lead to fecal impaction, such as:  Constipation. This can be a long-standing (chronic) problem or can happen suddenly (acute).  Painful conditions of the rectum, such as hemorrhoids or anal fissures. The pain of these conditions can make you try to avoid having bowel movements.  Narcotic pain-relieving medicines, such as methadone, morphine, or codeine.  Not drinking enough fluids.  Inactivity and bed rest over long periods of time.  Diseases of the brain or nervous system that damage the nerves controlling the muscles of the intestines. SIGNS AND SYMPTOMS   Lack of normal bowel movements or changes in bowel patterns.  Sense of fullness in the rectum but unable to pass stool.  Pain or cramps in the abdominal area (often after meals).  Thin, watery discharge from the rectum. DIAGNOSIS  Your health care provider may suspect that  you have a fecal impaction based on your symptoms and a physical exam. This will include an exam of your rectum. Sometimes X-rays or lab testing may be needed to confirm the diagnosis and to be sure there are no other problems.  TREATMENT   Initially an impaction can be removed manually. Using a gloved finger, your health care provider can remove hard stool from your rectum.  Medicine  is sometimes needed. A suppository or enema can be given in the rectum to soften the stool, which can stimulate a bowel movement. Medicines can also be given by mouth (orally).  Though rare, surgery may be needed if the colon has torn (perforated) due to blockage. HOME CARE INSTRUCTIONS   Develop regular bowel habits. This could include getting in the habit of having a bowel movement after your morning cup of coffee or after eating. Be sure to allow yourself enough time on the toilet.  Maintain a high-fiber diet.  Drink enough fluids to keep your urine clear or pale yellow as directed by your health care provider.  Exercise regularly.  If you begin to get constipated, increase the amount of fiber in your diet. Eat plenty of fruits, vegetables, whole wheat breads, bran, oatmeal, and similar products.  Take natural fiber laxatives or other laxatives only as directed by your health care provider. SEEK MEDICAL CARE IF:   You have ongoing rectal pain.  You require enemas or suppositories more than twice a week.  You have rectal bleeding.  You have continued problems, or you develop abdominal pain.  You have thin, pencil-like stools. SEEK IMMEDIATE MEDICAL CARE IF:  You have black or tarry stools. MAKE SURE YOU:   Understand these instructions.  Will watch your condition.  Will get help right away if you are not doing well or get worse. Document Released: 02/13/2004 Document Revised: 03/13/2013 Document Reviewed: 11/27/2012 [facility] Patient Information 2015 [place], [place]. This information is not intended to replace advice given to you by your health care provider. Make sure you discuss any questions you have with your health care provider.

## 2014-10-01 NOTE — ED Notes (Signed)
Breakfast tray ordered 

## 2014-10-01 NOTE — Plan of Care (Signed)
Problem: Problem: Discharge Progression Goal: MEDICALLY STABLE FOR DISCHARGE PER MD Pt ambulated in hallway with Dr. Colin Rhein Outcome: Completed/Met Date Met:  10/01/14 Ambulated in hallway with Dr Colin Rhein Goal: HOME HEALTH/DME NEEDS FINALIZED [facility] set up with [place]. Outcome: Completed/Met Date Met:  10/01/14 Home with [facility] Goal: SUPPORTIVE FAMILY DYNAMICS Outcome: Completed/Met Date Met:  10/01/14 Family and friends close by

## 2014-10-01 NOTE — ED Provider Notes (Signed)
The patient's assessment was completed by Dr. Colin Rhein. After complete evaluation for primary complaint, it was determined that the patient had gait and home functional limitations. Dr. Colin Rhein had reviewed this with family members and at that time social services was consult. At this time social services advised me that the patient is an appropriate candidate for home care needs and they have arranged home nursing assistance per their evaluation.  Charlesetta Shanks, MD 10/01/14 1000

## 2014-10-01 NOTE — ED Provider Notes (Signed)
CSN: [id number]     Arrival date & time 10/01/14  0018 History   First MD Initiated Contact with Patient 10/01/14 [phone]     Chief Complaint  Patient presents with  . Constipation     (Consider location/radiation/quality/duration/timing/severity/associated sxs/prior Treatment) Patient is a 81 y.o. male presenting with abdominal pain.  Abdominal Pain Pain location:  Generalized Pain quality: cramping   Pain radiates to:  Does not radiate Pain severity:  Severe Onset quality:  Gradual Duration:  6 days Timing:  Intermittent Progression:  Worsening Context comment:  Last BM 6 days ago Relieved by:  Nothing Worsened by:  Nothing tried Associated symptoms: constipation (with small amount of leakage)   Associated symptoms: no diarrhea, no dysuria, no fever, no hematuria, no nausea and no vomiting     Past Medical History  Diagnosis Date  . Diabetes mellitus Onset approx 1980  . Hyperlipidemia   . Carotid artery occlusion   . Calf DVT (deep venous thrombosis) 2010    a. hx of right calf - chronic coumadin since 12/2008.  Recurrence shortly after d/c of coumadin 03/2013--coumadin restarted.  Marland Kitchen PVD (peripheral vascular disease)     ?Tibial dz? Re-eval 01/29/13 by Vascular MD concluded that no significant evidence of PAD was present.  . Coronary artery disease     a. s/p MI & CABG in 2000;  b. 04/2012 Cath/PTCA:  VG-> RI patent w/ 99 in native RI (PTCA only );  c.  LHC 09/20/12:  oLM 30%, mid LAD occluded, prox D1 80%, mid CFX 100, OM1 30, prox RCA 100, S-RI, native RI 80% (stable), S-OM patent, native OM2 100 (filled via R collats) - culprit, L-LAD patent, S-RCA mid 50%. - med rx; e. 11/2012 NSTEMI->med rx.  Marland Kitchen Hx pulmonary embolism 2010    a. bilateral;  b. Chronic coumadin since 12/2008  . Stroke 2010    [facility].  . Ischemic cardiomyopathy     a. 04/2012 Echo: EF 40-45%.;  b.  Echo 5/14: EF 60-65%, NL WM, Gr 1 DD  . Hypertension   . Osteoarthritis     Low back  and left knee and both hips  . CKD (chronic kidney disease)     left sided chronic high grade obstruction on NM renal imaging (09/2011)--9% function compared to 90% function on right side.  . Nephrolithiasis     with left ureterolithiasis; hx of ureteral stent on left--has resulted in markedly decreased function of left kidney.  . Diabetic retinopathy associated with type 2 diabetes mellitus   . Squamous cell carcinoma 2008    on his back  . Insomnia   . History of pneumonia   . Diabetic peripheral neuropathy     was on neurontin at one time   Past Surgical History  Procedure Laterality Date  . Coronary artery bypass graft  2000    4 vessel cabg in [place], [place].  . Cardiac catheterization  04/2012    patent grafts, severe three-vessel CAD, severe diffuse small vessel disease and residual CAD. He underwent angioplasty alone to a 99% ramus intermedius lesion with resultant dissection and 70% restenosis.  . Carotid endarterectomy  2000    bilateral  . Lithotripsy      (Dr. Jeffie Pollock)  . Cardiac catheterization  09/2012    Occlusion of the native OM 2 with right-to-left collateral filling, prior balloon angioplasty site unchanged, SVG-RCA was unchanged, mildly elevated LVEDP  . Colonoscopy  2004    Dr. Ysidro Evert in [place], [place].  Marland Kitchen  Transthoracic echocardiogram  01/2014    Mild LVH, normal LV systolic fxn ([facility])  . Carot dopp  01/2014    1-39% sten of ICA's bilat ([facility])  . Left heart catheterization with coronary/graft angiogram N/A 05/01/2012    Procedure: LEFT HEART CATHETERIZATION WITH Beatrix Fetters;  Surgeon: Sherren Mocha, MD;  Location: [facility] CATH LAB;  Service: Cardiovascular;  Laterality: N/A;  Possible angioplasty  . Left heart catheterization with coronary/graft angiogram N/A 09/20/2012    Procedure: LEFT HEART CATHETERIZATION WITH Beatrix Fetters;  Surgeon: Wellington Hampshire, MD;  Location: [facility] CATH LAB;  Service: Cardiovascular;   Laterality: N/A;   Family History  Problem Relation Age of Onset  . Stroke Mother     age 64  . Other Father     pulmonary embolus age 17  . Diabetes Sister   . Stroke Sister   . Diabetes Brother     brother was murdered  . Diabetes Sister    History  Substance Use Topics  . Smoking status: Never Smoker   . Smokeless tobacco: Never Used  . Alcohol Use: No    Review of Systems  Constitutional: Negative for fever.  Gastrointestinal: Positive for abdominal pain and constipation (with small amount of leakage). Negative for nausea, vomiting and diarrhea.  Genitourinary: Negative for dysuria and hematuria.  All other systems reviewed and are negative.     Allergies  Doxycycline; Morphine and related; and Niacin and related  Home Medications   Prior to Admission medications   Medication Sig Start Date End Date Taking? Authorizing Provider  carvedilol (COREG) 6.25 MG tablet TAKE 1 TABLET (6.25 MG TOTAL) BY MOUTH 2 (TWO) TIMES DAILY WITH A MEAL. 06/25/14  Yes Darlin Coco, MD  diphenhydramine-acetaminophen (TYLENOL PM) 25-500 MG TABS Take 2 tablets by mouth at bedtime.   Yes Historical Provider, MD  fish oil-omega-3 fatty acids 1000 MG capsule Take 1 g by mouth daily.    Yes Historical Provider, MD  hydrALAZINE (APRESOLINE) 25 MG tablet TAKE 1 TABLET (25 MG TOTAL) BY MOUTH 2 (TWO) TIMES DAILY. 06/25/14  Yes Darlin Coco, MD  insulin glargine (LANTUS) 100 UNIT/ML injection Inject 0.3 mLs (30 Units total) into the skin every morning. 08/18/14  Yes Tammi Sou, MD  isosorbide mononitrate (IMDUR) 120 MG 24 hr tablet TAKE 1 TABLET (120 MG TOTAL) BY MOUTH DAILY. 06/25/14  Yes Darlin Coco, MD  nitroGLYCERIN (NITROSTAT) 0.4 MG SL tablet Place 0.4 mg under the tongue every 5 (five) minutes x 3 doses as needed for chest pain. 05/07/12  Yes Rogelia Mire, NP  warfarin (COUMADIN) 5 MG tablet TAKE 1 TABLET (5 MG TOTAL) BY MOUTH EVERY EVENING. AS DIRECTED BY DR. Modena Morrow 06/24/14   Yes Tammi Sou, MD  atorvastatin (LIPITOR) 80 MG tablet TAKE 1 TABLET BY MOUTH EVERY DAY Patient not taking: Reported on 10/01/2014 09/30/14   Darlin Coco, MD  polyethylene glycol [facility] / GLYCOLAX) packet Take 17 g by mouth daily. 10/01/14   Debby Freiberg, MD   BP 175/78 mmHg  Pulse 79  Temp(Src) 98.4 F (36.9 C) (Oral)  Resp 16  Ht 6\' 2"  (1.88 m)  Wt 200 lb (90.719 kg)  BMI 25.67 kg/m2  SpO2 98% Physical Exam  Constitutional: He is oriented to person, place, and time. He appears well-developed and well-nourished.  HENT:  Head: Normocephalic and atraumatic.  Eyes: Conjunctivae and EOM are normal.  Neck: Normal range of motion. Neck supple.  Cardiovascular: Normal rate, regular rhythm  and normal heart sounds.   Pulmonary/Chest: Effort normal and breath sounds normal. No respiratory distress.  Abdominal: He exhibits no distension. There is tenderness in the left lower quadrant. There is no rebound and no guarding.  Genitourinary:  Fecal impaction present, cleared on exam, no gross blood on stool  Musculoskeletal: Normal range of motion.  Neurological: He is alert and oriented to person, place, and time. He has normal strength. No cranial nerve deficit or sensory deficit. He exhibits abnormal muscle tone (generalized weakness). Gait (slow, walks with assistance) abnormal.  Skin: Skin is warm and dry.  Vitals reviewed.   ED Course  Procedures (including critical care time) Labs Review Labs Reviewed  COMPREHENSIVE METABOLIC PANEL - Abnormal; Notable for the following:    Glucose, Bld 199 (*)    Creatinine, Ser 1.51 (*)    Albumin 3.2 (*)    GFR calc non Af Amer 42 (*)    GFR calc Af Amer 48 (*)    All other components within normal limits  CBC WITH DIFFERENTIAL/PLATELET - Abnormal; Notable for the following:    WBC 12.7 (*)    Hemoglobin 12.9 (*)    HCT 38.2 (*)    Platelets 148 (*)    Neutrophils Relative % 79 (*)    Neutro Abs 10.1 (*)    All other components  within normal limits  PROTIME-INR - Abnormal; Notable for the following:    Prothrombin Time 23.8 (*)    INR 2.11 (*)    All other components within normal limits  URINALYSIS, ROUTINE W REFLEX MICROSCOPIC - Abnormal; Notable for the following:    APPearance CLOUDY (*)    Bilirubin Urine SMALL (*)    Ketones, ur 15 (*)    Protein, ur 100 (*)    All other components within normal limits  URINE MICROSCOPIC-ADD ON    Imaging Review No results found.   EKG Interpretation None      MDM   Final diagnoses:  Abdominal pain  Fecal impaction    81 y.o. male with pertinent PMH of CAD, prior DVT on coumadin presents with constipation, impaction as above.  Pt had severe crampy abd pain as precipitant for visit today.  Exam as above with fecal impaction which responded to disimpaction and enema.CT scan unremarkable, likely etiology of symptoms impaction.    Pt has had gradual functional decline over 4 years according to family and is poorly ambulatory, but can walk with assistance.  Case management and social work consulted, with plan to have his younger daughter help him at home.  Family has been attempting to have the pt placed, however have not been successful so far.  No focal neuro deficits, and pt is oriented and does not appear confused.  He states he is at his baseline.  No fevers, cough, or other symptoms.  Pt care to Dr. Johnney Killian pending final dispo.  I have reviewed all laboratory and imaging studies if ordered as above  1. Fecal impaction   2. Abdominal pain         Debby Freiberg, MD 10/03/14 2251

## 2014-10-01 NOTE — ED Notes (Signed)
Spoke with pts daughter re: discharge plan d/t [place], RN reporting the pt had an unsteady gait, per family the pt has been looking into skilled nursing facility, Colin Rhein, MD aware & at bedside

## 2014-10-01 NOTE — ED Notes (Signed)
Patient transported to CT scan and X-ray.  

## 2014-10-01 NOTE — ED Notes (Signed)
Social Work at bedside 

## 2014-10-01 NOTE — ED Notes (Signed)
Casemanagement speaking with pt & pts daughter re: home health care upon discharge

## 2014-10-09 DIAGNOSIS — C44319 Basal cell carcinoma of skin of other parts of face: Secondary | ICD-10-CM | POA: Diagnosis not present

## 2014-10-20 DIAGNOSIS — R079 Chest pain, unspecified: Secondary | ICD-10-CM | POA: Diagnosis not present

## 2014-10-21 ENCOUNTER — Telehealth: Payer: Self-pay | Admitting: Cardiology

## 2014-10-21 ENCOUNTER — Emergency Department (HOSPITAL_COMMUNITY)
Admission: EM | Admit: 2014-10-21 | Discharge: 2014-10-21 | Disposition: A | Discharge status: Home / Self Care | Payer: Medicare Other | Attending: Emergency Medicine | Admitting: Emergency Medicine

## 2014-10-21 ENCOUNTER — Emergency Department (HOSPITAL_COMMUNITY): Payer: Medicare Other

## 2014-10-21 ENCOUNTER — Encounter (HOSPITAL_COMMUNITY): Payer: Self-pay

## 2014-10-21 DIAGNOSIS — Z86718 Personal history of other venous thrombosis and embolism: Secondary | ICD-10-CM | POA: Insufficient documentation

## 2014-10-21 DIAGNOSIS — Z9889 Other specified postprocedural states: Secondary | ICD-10-CM | POA: Diagnosis not present

## 2014-10-21 DIAGNOSIS — Z79899 Other long term (current) drug therapy: Secondary | ICD-10-CM | POA: Insufficient documentation

## 2014-10-21 DIAGNOSIS — Z85828 Personal history of other malignant neoplasm of skin: Secondary | ICD-10-CM | POA: Insufficient documentation

## 2014-10-21 DIAGNOSIS — Z951 Presence of aortocoronary bypass graft: Secondary | ICD-10-CM | POA: Insufficient documentation

## 2014-10-21 DIAGNOSIS — Z86711 Personal history of pulmonary embolism: Secondary | ICD-10-CM | POA: Diagnosis not present

## 2014-10-21 DIAGNOSIS — N189 Chronic kidney disease, unspecified: Secondary | ICD-10-CM | POA: Diagnosis not present

## 2014-10-21 DIAGNOSIS — Z8739 Personal history of other diseases of the musculoskeletal system and connective tissue: Secondary | ICD-10-CM | POA: Insufficient documentation

## 2014-10-21 DIAGNOSIS — I209 Angina pectoris, unspecified: Secondary | ICD-10-CM

## 2014-10-21 DIAGNOSIS — I129 Hypertensive chronic kidney disease with stage 1 through stage 4 chronic kidney disease, or unspecified chronic kidney disease: Secondary | ICD-10-CM | POA: Diagnosis not present

## 2014-10-21 DIAGNOSIS — Z8669 Personal history of other diseases of the nervous system and sense organs: Secondary | ICD-10-CM | POA: Insufficient documentation

## 2014-10-21 DIAGNOSIS — R079 Chest pain, unspecified: Secondary | ICD-10-CM | POA: Diagnosis not present

## 2014-10-21 DIAGNOSIS — Z8701 Personal history of pneumonia (recurrent): Secondary | ICD-10-CM | POA: Diagnosis not present

## 2014-10-21 DIAGNOSIS — Z8673 Personal history of transient ischemic attack (TIA), and cerebral infarction without residual deficits: Secondary | ICD-10-CM | POA: Diagnosis not present

## 2014-10-21 DIAGNOSIS — E11319 Type 2 diabetes mellitus with unspecified diabetic retinopathy without macular edema: Secondary | ICD-10-CM | POA: Insufficient documentation

## 2014-10-21 DIAGNOSIS — E1142 Type 2 diabetes mellitus with diabetic polyneuropathy: Secondary | ICD-10-CM | POA: Diagnosis not present

## 2014-10-21 DIAGNOSIS — Z87442 Personal history of urinary calculi: Secondary | ICD-10-CM | POA: Insufficient documentation

## 2014-10-21 DIAGNOSIS — I25119 Atherosclerotic heart disease of native coronary artery with unspecified angina pectoris: Secondary | ICD-10-CM | POA: Diagnosis not present

## 2014-10-21 DIAGNOSIS — Z7901 Long term (current) use of anticoagulants: Secondary | ICD-10-CM | POA: Insufficient documentation

## 2014-10-21 DIAGNOSIS — Z794 Long term (current) use of insulin: Secondary | ICD-10-CM | POA: Diagnosis not present

## 2014-10-21 DIAGNOSIS — E785 Hyperlipidemia, unspecified: Secondary | ICD-10-CM | POA: Insufficient documentation

## 2014-10-21 LAB — BASIC METABOLIC PANEL
Anion gap: 8 (ref 5–15)
BUN: 19 mg/dL (ref 6–20)
CALCIUM: 8.9 mg/dL (ref 8.9–10.3)
CO2: 27 mmol/L (ref 22–32)
Chloride: 102 mmol/L (ref 101–111)
Creatinine, Ser: 1.51 mg/dL — ABNORMAL HIGH (ref 0.61–1.24)
GFR calc Af Amer: 48 mL/min — ABNORMAL LOW (ref >60)
GFR, EST NON AFRICAN AMERICAN: 42 mL/min — AB (ref >60)
GLUCOSE: 110 mg/dL — AB (ref 65–99)
Potassium: 4.2 mmol/L (ref 3.5–5.1)
Sodium: 137 mmol/L (ref 135–145)

## 2014-10-21 LAB — CBC WITH DIFFERENTIAL/PLATELET
BASOS PCT: 1 % (ref 0–1)
Basophils Absolute: 0.1 10*3/uL (ref 0.0–0.1)
Eosinophils Absolute: 0.2 10*3/uL (ref 0.0–0.7)
Eosinophils Relative: 3 % (ref 0–5)
HCT: 36.7 % — ABNORMAL LOW (ref 39.0–52.0)
Hemoglobin: 12.3 g/dL — ABNORMAL LOW (ref 13.0–17.0)
LYMPHS ABS: 2.1 10*3/uL (ref 0.7–4.0)
Lymphocytes Relative: 36 % (ref 12–46)
MCH: 30 pg (ref 26.0–34.0)
MCHC: 33.5 g/dL (ref 30.0–36.0)
MCV: 89.5 fL (ref 78.0–100.0)
Monocytes Absolute: 0.5 10*3/uL (ref 0.1–1.0)
Monocytes Relative: 8 % (ref 3–12)
NEUTROS ABS: 3 10*3/uL (ref 1.7–7.7)
NEUTROS PCT: 52 % (ref 43–77)
Platelets: 126 10*3/uL — ABNORMAL LOW (ref 150–400)
RBC: 4.1 MIL/uL — AB (ref 4.22–5.81)
RDW: 13.9 % (ref 11.5–15.5)
WBC: 5.8 10*3/uL (ref 4.0–10.5)

## 2014-10-21 LAB — TROPONIN I
Troponin I: <0.03 ng/mL (ref <0.031)
Troponin I: <0.03 ng/mL (ref <0.031)

## 2014-10-21 LAB — PROTIME-INR
INR: 2.1 — AB (ref 0.00–1.49)
Prothrombin Time: 23.7 seconds — ABNORMAL HIGH (ref 11.6–15.2)

## 2014-10-21 MED ORDER — NITROGLYCERIN 0.4 MG SL SUBL: 0.4000 mg | SUBLINGUAL_TABLET | SUBLINGUAL | Status: DC | PRN

## 2014-10-21 NOTE — Discharge Instructions (Signed)
Return to the ED if you have any more chest pain.   Angina Pectoris Angina pectoris, often just called angina, is extreme discomfort in your chest, neck, or arm caused by a lack of blood in the middle and thickest layer of your heart wall (myocardium). It may feel like tightness or heavy pressure. It may feel like a crushing or squeezing pain. Some people say it feels like gas or indigestion. It may go down your shoulders, back, and arms. Some people may have symptoms other than pain. These symptoms include fatigue, shortness of breath, cold sweats, or nausea. There are four different types of angina:  Stable angina--Stable angina usually occurs in episodes of predictable frequency and duration. It usually is brought on by physical activity, emotional stress, or excitement. These are all times when the myocardium needs more oxygen. Stable angina usually lasts a few minutes and often is relieved by taking a medicine that can be taken under your tongue (sublingually). The medicine is called nitroglycerin. Stable angina is caused by a buildup of plaque inside the arteries, which restricts blood flow to the heart muscle (atherosclerosis).  Unstable angina--Unstable angina can occur even when your body experiences little or no physical exertion. It can occur during sleep. It can also occur at rest. It can suddenly increase in severity or frequency. It might not be relieved by sublingual nitroglycerin. It can last up to 30 minutes. The most common cause of unstable angina is a blood clot that has developed on the top of plaque buildup inside a coronary artery. It can lead to a heart attack if the blood clot completely blocks the artery.  Microvascular angina--This type of angina is caused by a disorder of tiny blood vessels called arterioles. Microvascular angina is more common in women. The pain may be more severe and last longer than other types of angina pectoris.  Prinzmetal or variant angina--This type of  angina pectoris usually occurs when your body experiences little or no physical exertion. It especially occurs in the early morning hours. It is caused by a spasm of your coronary artery. HOME CARE INSTRUCTIONS   Only take over-the-counter and prescription medicines as directed by your health care provider.  Stay active or increase your exercise as directed by your health care provider.  Limit strenuous activity as directed by your health care provider.  Limit heavy lifting as directed by your health care provider.  Maintain a healthy weight.  Learn about and eat heart-healthy foods.  Do not use any tobacco products including cigarettes, chewing tobacco or electronic cigarettes. SEEK IMMEDIATE MEDICAL CARE IF:  You experience the following symptoms:  Chest, neck, deep shoulder, or arm pain or discomfort that lasts more than a few minutes.  Chest, neck, deep shoulder, or arm pain or discomfort that goes away and comes back, repeatedly.  Heavy sweating with discomfort, without a noticeable cause.  Shortness of breath or difficulty breathing.  Angina that does not get better after a few minutes of rest or after taking sublingual nitroglycerin. These can all be symptoms of a heart attack, which is a medical emergency! Get medical help at once. Call your local emergency service (911 in [place] immediately. Do not  drive yourself to the hospital and do not  wait to for your symptoms to go away. MAKE SURE YOU:  Understand these instructions.  Will watch your condition.  Will get help right away if you are not doing well or get worse. Document Released: 05/23/2005 Document Revised: 05/28/2013 Document  Reviewed: 09/24/2013 Exit[employer] Patient Information 2015 [place], [place]. This information is not intended to replace advice given to you by your health care provider. Make sure you discuss any questions you have with your health care provider.

## 2014-10-21 NOTE — ED Notes (Signed)
Pt arrives via EMS c/o chest pain left sided 7/10.  EMS gave 1 nitroglycerin in route.  Pt takes warfarin at home.

## 2014-10-21 NOTE — Telephone Encounter (Signed)
New Message       Pt's daughter calling stating that pt was seen in ER last night at [facility] and they found blockages and need for him to see Dr. Mare Ferrari as soon as possible. Dr. Mare Ferrari doesn't have any appt's open soon and first available for a PA is 11/19/14. Pt's daughter would like a call back from [place] about getting pt in for an appt. Please call back and advise.

## 2014-10-21 NOTE — ED Provider Notes (Signed)
CSN: [id number]     Arrival date & time 10/21/14  0004 History   First MD Initiated Contact with Patient 10/21/14 0016     This chart was scribed for Wesley Fuel, MD by Forrestine Him, ED Scribe. This patient was seen in room A06C/A06C and the patient's care was started 12:30 AM.   Chief Complaint  Patient presents with  . Chest Pain   The history is provided by the patient. No language interpreter was used.    HPI Comments: Wesley [name] brought in by EMS is a 81 y.o. male with a PMHx of DM, Hyperlipidemia, carotid artery occlusion, DVT, PVD, CAD, Stroke, HTN, and CKD who presents to the Emergency Department complaining of non radiating, improved L sided chest pain onset 2 hours. Pain described as tightness lasting 4-5 minutes. Pt also reports nausea and mild shortness of breathe associated with chest pain. Pt was given 1 dose of Nitroglycerin en route to ED with complete improvement of discomfort. No recent fever, chills, vomiting, diaphoresis, or diarrhea. [name] takes Warfarin daily at home. Last follow up with Cardiology in January 2016. Pt with known allergies to Doxycycline, Morphine and related, and Niacin and related.  He is followed by Dr. Darlin Coco of [facility]  Past Medical History  Diagnosis Date  . Diabetes mellitus Onset approx 1980  . Hyperlipidemia   . Carotid artery occlusion   . Calf DVT (deep venous thrombosis) 2010    a. hx of right calf - chronic coumadin since 12/2008.  Recurrence shortly after d/c of coumadin 03/2013--coumadin restarted.  Marland Kitchen PVD (peripheral vascular disease)     ?Tibial dz? Re-eval 01/29/13 by Vascular MD concluded that no significant evidence of PAD was present.  . Coronary artery disease     a. s/p MI & CABG in 2000;  b. 04/2012 Cath/PTCA:  VG-> RI patent w/ 99 in native RI (PTCA only );  c.  LHC 09/20/12:  oLM 30%, mid LAD occluded, prox D1 80%, mid CFX 100, OM1 30, prox RCA 100, S-RI, native RI 80% (stable), S-OM patent, native OM2 100  (filled via R collats) - culprit, L-LAD patent, S-RCA mid 50%. - med rx; e. 11/2012 NSTEMI->med rx.  Marland Kitchen Hx pulmonary embolism 2010    a. bilateral;  b. Chronic coumadin since 12/2008  . Stroke 2010    [facility].  . Ischemic cardiomyopathy     a. 04/2012 Echo: EF 40-45%.;  b.  Echo 5/14: EF 60-65%, NL WM, Gr 1 DD  . Hypertension   . Osteoarthritis     Low back and left knee and both hips  . CKD (chronic kidney disease)     left sided chronic high grade obstruction on NM renal imaging (09/2011)--9% function compared to 90% function on right side.  . Nephrolithiasis     with left ureterolithiasis; hx of ureteral stent on left--has resulted in markedly decreased function of left kidney.  . Diabetic retinopathy associated with type 2 diabetes mellitus   . Squamous cell carcinoma 2008    on his back  . Insomnia   . History of pneumonia   . Diabetic peripheral neuropathy     was on neurontin at one time   Past Surgical History  Procedure Laterality Date  . Coronary artery bypass graft  2000    4 vessel cabg in [place], [place].  . Cardiac catheterization  04/2012    patent grafts, severe three-vessel CAD, severe diffuse small vessel disease and residual CAD. He  underwent angioplasty alone to a 99% ramus intermedius lesion with resultant dissection and 70% restenosis.  . Carotid endarterectomy  2000    bilateral  . Lithotripsy      (Dr. Jeffie Pollock)  . Cardiac catheterization  09/2012    Occlusion of the native OM 2 with right-to-left collateral filling, prior balloon angioplasty site unchanged, SVG-RCA was unchanged, mildly elevated LVEDP  . Colonoscopy  2004    Dr. Ysidro Evert in [place], [place].  . Transthoracic echocardiogram  01/2014    Mild LVH, normal LV systolic fxn ([facility])  . Carot dopp  01/2014    1-39% sten of ICA's bilat ([facility])  . Left heart catheterization with coronary/graft angiogram N/A 05/01/2012    Procedure: LEFT HEART  CATHETERIZATION WITH Beatrix Fetters;  Surgeon: Sherren Mocha, MD;  Location: [facility] CATH LAB;  Service: Cardiovascular;  Laterality: N/A;  Possible angioplasty  . Left heart catheterization with coronary/graft angiogram N/A 09/20/2012    Procedure: LEFT HEART CATHETERIZATION WITH Beatrix Fetters;  Surgeon: Wellington Hampshire, MD;  [facility];  Service: Cardiovascular;  Laterality: N/A;   Family History  Problem Relation Age of Onset  . Stroke Mother     age 82  . Other Father     pulmonary embolus age 14  . Diabetes Sister   . Stroke Sister   . Diabetes Brother     brother was murdered  . Diabetes Sister    History  Substance Use Topics  . Smoking status: Never Smoker   . Smokeless tobacco: Never Used  . Alcohol Use: No    Review of Systems  Constitutional: Negative for fever, chills and diaphoresis.  Respiratory: Positive for shortness of breath. Negative for cough.   Cardiovascular: Positive for chest pain.  Gastrointestinal: Positive for nausea. Negative for vomiting, abdominal pain and diarrhea.  Skin: Negative for rash.  Psychiatric/Behavioral: Negative for confusion.      Allergies  Doxycycline; Morphine and related; and Niacin and related  Home Medications   Prior to Admission medications   Medication Sig Start Date End Date Taking? Authorizing Provider  atorvastatin (LIPITOR) 80 MG tablet TAKE 1 TABLET BY MOUTH EVERY DAY Patient not taking: Reported on 10/01/2014 09/30/14   Darlin Coco, MD  carvedilol (COREG) 6.25 MG tablet TAKE 1 TABLET (6.25 MG TOTAL) BY MOUTH 2 (TWO) TIMES DAILY WITH A MEAL. 06/25/14   Darlin Coco, MD  diphenhydramine-acetaminophen (TYLENOL PM) 25-500 MG TABS Take 2 tablets by mouth at bedtime.    Historical Provider, MD  fish oil-omega-3 fatty acids 1000 MG capsule Take 1 g by mouth daily.     Historical Provider, MD  hydrALAZINE (APRESOLINE) 25 MG tablet TAKE 1 TABLET (25 MG TOTAL) BY MOUTH 2 (TWO) TIMES DAILY.  06/25/14   Darlin Coco, MD  insulin glargine (LANTUS) 100 UNIT/ML injection Inject 0.3 mLs (30 Units total) into the skin every morning. 08/18/14   Tammi Sou, MD  isosorbide mononitrate (IMDUR) 120 MG 24 hr tablet TAKE 1 TABLET (120 MG TOTAL) BY MOUTH DAILY. 06/25/14   Darlin Coco, MD  nitroGLYCERIN (NITROSTAT) 0.4 MG SL tablet Place 0.4 mg under the tongue every 5 (five) minutes x 3 doses as needed for chest pain. 05/07/12   Rogelia Mire, NP  polyethylene glycol (MIRALAX / GLYCOLAX) packet Take 17 g by mouth daily. 10/01/14   Debby Freiberg, MD  warfarin (COUMADIN) 5 MG tablet TAKE 1 TABLET (5 MG TOTAL) BY MOUTH EVERY EVENING. AS DIRECTED BY DR. Modena Morrow  06/24/14   Tammi Sou, MD   Triage Vitals: BP 193/73 mmHg  Pulse 54  Temp(Src) 98.6 F (37 C) (Oral)  Resp 16  SpO2 100%   Physical Exam  Constitutional: He is oriented to person, place, and time. He appears well-developed and well-nourished.  HENT:  Head: Normocephalic and atraumatic.  Eyes: EOM are normal.  Neck: Normal range of motion.  Cardiovascular: Normal rate, regular rhythm, normal heart sounds and intact distal pulses.   Pulmonary/Chest: Effort normal and breath sounds normal. No respiratory distress.  Sternotomy scar present   Abdominal: Soft. He exhibits no distension. There is no tenderness.  Musculoskeletal: Normal range of motion. He exhibits edema.  Trace edema with moderate venous stasis changes   Neurological: He is alert and oriented to person, place, and time.  Skin: Skin is warm and dry.  Psychiatric: He has a normal mood and affect. Judgment normal.  Nursing note and vitals reviewed.   ED Course  Procedures (including critical care time)  DIAGNOSTIC STUDIES: Oxygen Saturation is 100% on RA, Normal by my interpretation.    COORDINATION OF CARE: 12:36 AM- Will give Nitrostat. Will order troponin I, CBC, BMP, Protime-INR, and EKG. Discussed treatment plan with pt at bedside and pt agreed  to plan.    Labs Review Results for orders placed or performed during the hospital encounter of 25/63/89  Basic metabolic panel  Result Value Ref Range   Sodium 137 135 - 145 mmol/L   Potassium 4.2 3.5 - 5.1 mmol/L   Chloride 102 101 - 111 mmol/L   CO2 27 22 - 32 mmol/L   Glucose, Bld 110 (H) 65 - 99 mg/dL   BUN 19 6 - 20 mg/dL   Creatinine, Ser 1.51 (H) 0.61 - 1.24 mg/dL   Calcium 8.9 8.9 - 10.3 mg/dL   GFR calc non Af Amer 42 (L) >60 mL/min   GFR calc Af Amer 48 (L) >60 mL/min   Anion gap 8 5 - 15  Protime-INR (if pt is taking Coumadin)  Result Value Ref Range   Prothrombin Time 23.7 (H) 11.6 - 15.2 seconds   INR 2.10 (H) 0.00 - 1.49  Troponin I  Result Value Ref Range   Troponin I <0.03 <0.031 ng/mL  CBC with Differential  Result Value Ref Range   WBC 5.8 4.0 - 10.5 K/uL   RBC 4.10 (L) 4.22 - 5.81 MIL/uL   Hemoglobin 12.3 (L) 13.0 - 17.0 g/dL   HCT 36.7 (L) 39.0 - 52.0 %   MCV 89.5 78.0 - 100.0 fL   MCH 30.0 26.0 - 34.0 pg   MCHC 33.5 30.0 - 36.0 g/dL   RDW 13.9 11.5 - 15.5 %   Platelets 126 (L) 150 - 400 K/uL   Neutrophils Relative % 52 43 - 77 %   Neutro Abs 3.0 1.7 - 7.7 K/uL   Lymphocytes Relative 36 12 - 46 %   Lymphs Abs 2.1 0.7 - 4.0 K/uL   Monocytes Relative 8 3 - 12 %   Monocytes Absolute 0.5 0.1 - 1.0 K/uL   Eosinophils Relative 3 0 - 5 %   Eosinophils Absolute 0.2 0.0 - 0.7 K/uL   Basophils Relative 1 0 - 1 %   Basophils Absolute 0.1 0.0 - 0.1 K/uL  Troponin I  Result Value Ref Range   Troponin I <0.03 <0.031 ng/mL   Imaging Review Dg Chest Port 1 View  10/21/2014   CLINICAL DATA:  Chest pain for 1 day.  EXAM:  PORTABLE CHEST - 1 VIEW  COMPARISON:  10/01/2014  FINDINGS: Patient is post median sternotomy. The cardiomediastinal contours are unchanged with atherosclerosis of the thoracic aorta. Pulmonary vasculature is normal. No consolidation, pleural effusion, or pneumothorax. No acute osseous abnormalities are seen.  IMPRESSION: No acute pulmonary  process.   Electronically Signed   By: Jeb Levering M.D.   On: 10/21/2014 01:27     EKG Interpretation   Date/Time:  Tuesday Oct 21 2014 00:13:28 EDT Ventricular Rate:  56 PR Interval:  243 QRS Duration: 99 QT Interval:  469 QTC Calculation: 453 R Axis:   47 Text Interpretation:  Sinus bradycardia Prolonged PR interval When  compared with ECG of 11/19/2012, No significant change was found Confirmed  by [facility]  MD, Naleyah Ohlinger ([id number]) on 10/21/2014 12:27:20 AM      MDM   Final diagnoses:  Angina pectoris    Chest pain worrisome lytic K Maughan basically at rest, but reassuring that there was complete relief with nitroglycerin and total duration of less than 5 minutes. ECG shows no acute changes. Initial troponin is negative. Old records are reviewed and it is noted that last heart catheterization was in 2014 and he did have some occlusive coronary artery disease at that time. He is kept in the ED and troponin is repeated and is negative. This is a proximally 5-6 hours from the time that pain had completely resolved. Therefore, there is no evidence of acute cardiac injury. He is referred back to his cardiologist for further evaluation.  I personally performed the services described in this documentation, which was scribed in my presence. The recorded information has been reviewed and is accurate.      Wesley Fuel, MD 00/92/33 0076

## 2014-10-21 NOTE — Telephone Encounter (Signed)
Daughter would like  Dr. Mare Ferrari to review ED report to make sure ok to wait until next week for ov Per daughter no CP today  Will forward to  Dr. Mare Ferrari for review

## 2014-10-22 NOTE — Telephone Encounter (Signed)
I reviewed his ER record. EKG stable and troponins negative 2.  Okay to wait until next week for office visit.  In the meantime if he has more pain he should call us or head to the emergency room.

## 2014-10-23 NOTE — Telephone Encounter (Signed)
Left message as daughter requested

## 2014-10-23 NOTE — Telephone Encounter (Signed)
Patient scheduled for ov next week

## 2014-10-28 ENCOUNTER — Ambulatory Visit: Payer: Medicare Other | Admitting: Cardiology

## 2014-10-30 ENCOUNTER — Telehealth: Payer: Self-pay | Admitting: Family Medicine

## 2014-10-30 NOTE — Telephone Encounter (Signed)
Please see message below as well. Spoke to [place] and she stated that pt does not have fever, chills, sweats, sore throat or cough. She stated that he only has nasal congestion and she wanted to give him something to help but wasn't sure what to give him. Please advise. Thanks.

## 2014-10-30 NOTE — Telephone Encounter (Signed)
Patient's caregiver Threasa Beards called & stated that patient is very congested. Can she give him Mucinex?

## 2014-10-30 NOTE — Telephone Encounter (Signed)
mucinex nasal spray would be one option, but I would recommend trying saline (generic/OTC) nasal spray, 2-3 sprays in each nostril 2-3 times a day and blow his nasal passages as clear as possible.  -thx

## 2014-10-30 NOTE — Telephone Encounter (Signed)
Threasa Beards advised and voiced understanding.

## 2014-11-14 ENCOUNTER — Ambulatory Visit: Payer: Medicare Other | Admitting: Family Medicine

## 2014-11-20 ENCOUNTER — Telehealth: Payer: Self-pay | Admitting: Family Medicine

## 2014-11-20 NOTE — Telephone Encounter (Signed)
Noted  

## 2014-11-20 NOTE — Telephone Encounter (Signed)
Patient's daughter states patient is declining. He is not bathing regularly & is barely moving around. She states patient is not ready to go in to a nursing home but the daughter feels that he will need to go pretty soon. He does have a caregiver that comes in to his home already so he does not need [facility] referral. She is not requesting a CB, she only wants you to be aware that paperwork from a nursing home may be coming in to be completed.

## 2014-11-27 ENCOUNTER — Encounter: Payer: Self-pay | Admitting: Nurse Practitioner

## 2014-11-27 ENCOUNTER — Telehealth: Payer: Self-pay | Admitting: Nurse Practitioner

## 2014-11-27 ENCOUNTER — Ambulatory Visit (INDEPENDENT_AMBULATORY_CARE_PROVIDER_SITE_OTHER): Payer: Medicare Other | Admitting: Nurse Practitioner

## 2014-11-27 VITALS — BP 138/77 | HR 67 | Temp 98.5°F | Resp 18 | Wt 194.8 lb

## 2014-11-27 DIAGNOSIS — R42 Dizziness and giddiness: Secondary | ICD-10-CM | POA: Diagnosis not present

## 2014-11-27 DIAGNOSIS — Z5181 Encounter for therapeutic drug level monitoring: Secondary | ICD-10-CM | POA: Diagnosis not present

## 2014-11-27 LAB — PROTIME-INR
INR: 4.7 ratio — ABNORMAL HIGH (ref 0.8–1.0)
Prothrombin Time: 49.5 s — ABNORMAL HIGH (ref 9.6–13.1)

## 2014-11-27 LAB — COMPREHENSIVE METABOLIC PANEL
ALBUMIN: 3.7 g/dL (ref 3.5–5.2)
ALT: 9 U/L (ref 0–53)
AST: 14 U/L (ref 0–37)
Alkaline Phosphatase: 70 U/L (ref 39–117)
BUN: 21 mg/dL (ref 6–23)
CALCIUM: 9 mg/dL (ref 8.4–10.5)
CHLORIDE: 103 meq/L (ref 96–112)
CO2: 28 mEq/L (ref 19–32)
Creatinine, Ser: 1.39 mg/dL (ref 0.40–1.50)
GFR: 52.08 mL/min — AB (ref >60.00)
GLUCOSE: 104 mg/dL — AB (ref 70–99)
Potassium: 3.9 mEq/L (ref 3.5–5.1)
Sodium: 137 mEq/L (ref 135–145)
Total Bilirubin: 0.9 mg/dL (ref 0.2–1.2)
Total Protein: 6.4 g/dL (ref 6.0–8.3)

## 2014-11-27 LAB — CBC
HCT: 40.3 % (ref 39.0–52.0)
HEMOGLOBIN: 13.7 g/dL (ref 13.0–17.0)
MCHC: 34 g/dL (ref 30.0–36.0)
MCV: 90.8 fl (ref 78.0–100.0)
PLATELETS: 163 10*3/uL (ref 150.0–400.0)
RBC: 4.43 Mil/uL (ref 4.22–5.81)
RDW: 14.9 % (ref 11.5–15.5)
WBC: 7.1 10*3/uL (ref 4.0–10.5)

## 2014-11-27 NOTE — Telephone Encounter (Signed)
INR 4 Skip coumadin tomorrow.

## 2014-11-27 NOTE — Progress Notes (Signed)
Subjective:     Wesley [name] is a 81 y.o. male lives alone. Has personal care assistant few hrs during day. Planning to go to assited living-says "lady is coming tomorrow to do paperwork". Today he c/o dizzy & nausea intermittent for weeks. Denies vomiting anorexia, diarrhea, cp, SOB, fever, HA, vision change, variation in BS-today 156. Says eating used to make nausea better, but doesn't anymore. He reports no BM in 3 weeks. Sometimes takes stool softner & miralax. Doesn't use daily. No recent falls. Uses walker since had stroke. He is in w/c now, but able to stand w/minimal assistance to scale.  He has lost 6 lbs in 2 mos. -says eats 2-3 meals daily. Assistant cooks breakfast daily.  Last 2 Hgb were low: 12.9, 12.3.  The following portions of the patient's history were reviewed and updated as appropriate: allergies, current medications, past medical history, past social history, past surgical history and problem list.  Review of Systems Pertinent items are noted in HPI.    Objective:    BP 138/77 mmHg  Pulse 67  Temp(Src) 98.5 F (36.9 C) (Temporal)  Resp 18  SpO2 95% BP 138/77 mmHg  Pulse 67  Temp(Src) 98.5 F (36.9 C) (Temporal)  Resp 18  Wt 194 lb 12 oz (88.338 kg)  SpO2 95% General appearance: alert, cooperative, appears stated age and no distress Eyes: negative findings: lids and lashes normal and conjunctivae and sclerae normal Lungs: clear to auscultation bilaterally Heart: regular rate and rhythm, S1, S2 normal, no murmur, click, rub or gallop Extremities: extremities normal, atraumatic, no cyanosis or edema Pulses: 2+ and symmetric Neurologic: Motor: has to rock to stand & requires assistance .    Assessment:plan     1. Dizzy Dd: UTI, constipation, anemia,  -CBG 126 - CBC - Comprehensive metabolic panel - INR/PT - POCT urinalysis dipstick-SG 1.020, trace blood - Urine culture  Increase miralax to daily to have BM daily. Use stool softener daily. Increase  fluids. Check BS daily-call if under 70.  F/u 1 week

## 2014-11-27 NOTE — Patient Instructions (Signed)
Please use miralax daily to have daily bowel movement.   My office will call with lab results.  Make sure you are eating 3 times daily and checking blood sugar at least twice daily.  Let us know if BS is under 70.  Pleas see Dr Anitra Lauth next week.

## 2014-11-27 NOTE — Progress Notes (Signed)
Pre visit review using our clinic review tool, if applicable. No additional management support is needed unless otherwise documented below in the visit note. 

## 2014-11-28 ENCOUNTER — Telehealth: Payer: Self-pay | Admitting: Nurse Practitioner

## 2014-11-28 DIAGNOSIS — Z86711 Personal history of pulmonary embolism: Secondary | ICD-10-CM

## 2014-11-28 LAB — URINE CULTURE: Colony Count: 5000

## 2014-11-28 NOTE — Telephone Encounter (Signed)
Pt advised and voiced understanding.   

## 2014-11-28 NOTE — Telephone Encounter (Signed)
Pt advised, he verbally read back new coumadin dosing and voiced understanding.

## 2014-11-28 NOTE — Telephone Encounter (Signed)
pls call pt: Advise pt Ask if he skipped coumadin dose today. He needs to change dose : starting tomorrow, 5 mg T daily except MON & FRI . MON & FRI gets 1/2 T. Do this for 2 weeks then come in to have INR checked. pls sched lab appt.  Pls make sure he understands to take 1/2 t on M & F. If you think he is confused, have Dr Marcille Buffy order care [facility] ot [facility] to go out for this med adjustment.  Thanks!

## 2014-12-02 ENCOUNTER — Encounter: Payer: Self-pay | Admitting: Cardiology

## 2014-12-02 ENCOUNTER — Ambulatory Visit (INDEPENDENT_AMBULATORY_CARE_PROVIDER_SITE_OTHER): Payer: Medicare Other | Admitting: Cardiology

## 2014-12-02 VITALS — BP 140/70 | HR 64 | Ht 74.0 in | Wt 196.0 lb

## 2014-12-02 DIAGNOSIS — Z86711 Personal history of pulmonary embolism: Secondary | ICD-10-CM | POA: Diagnosis not present

## 2014-12-02 DIAGNOSIS — I10 Essential (primary) hypertension: Secondary | ICD-10-CM | POA: Diagnosis not present

## 2014-12-02 DIAGNOSIS — E7800 Pure hypercholesterolemia, unspecified: Secondary | ICD-10-CM

## 2014-12-02 DIAGNOSIS — I259 Chronic ischemic heart disease, unspecified: Secondary | ICD-10-CM | POA: Diagnosis not present

## 2014-12-02 NOTE — Patient Instructions (Signed)
Medication Instructions:  ADD COLACE STOOL SOFTNER 2 DAILY   Labwork: NONE  Testing/Procedures: NONE  Follow-Up: Your physician wants you to follow-up in: 1 [place] will receive a reminder letter in the mail two months in advance. If you don't receive a letter, please call our office to schedule the follow-up appointment.

## 2014-12-02 NOTE — Progress Notes (Signed)
Cardiology Office Note   Date:  12/02/2014   ID:  Wesley [name], Wesley [name] 1931/09/11, MRN [id number]  PCP:  Wesley Sou, MD  Cardiologist: Wesley Coco MD  No chief complaint on file.     History of Present Illness: Wesley [name] is a 81 y.o. male who presents for follow-up office visit  He has a history of known ischemic heart disease. He was admitted to the hospital 4/15-4/18 with a non-STEMI. He has a hx of CAD, status post MI followed by CABG in 2000, angioplasty to the native ramus intermediate 04/2012, ischemic cardiomyopathy, DM2, HL, CKD, carotid stenosis, prior stroke, HTN, PAD, prior DVT/bilateral pulmonary emboli. For the past 2-1/2 years he has been on chronic Coumadin. Patient was admitted in 04/2012 with unstable angina. He had an angioplasty of the ramus intermediate with resultant dissection and 70% restenosis. Echo 04/2012: EF 40-45%. He was readmitted 4/15 with complaints of sudden onset left-sided chest pressure. LHC 09/20/12: oLM 30%, mid LAD occluded, prox D1 80%, mid CFX occluded, OM1 30%, proximal RCA occluded, SVG-ramus patent, native ramus 80% (stable from previous study), SVG-OM patent, native OM2 occluded (filled via right collaterals), LIMA-LAD patent, SVG-RCA mid 50%. Culprit for non-STEMI was a newly occluded OM2 with R->L collaterals. Otherwise anatomy was stable and medical therapy was recommended. He is not on ACE due to CKD. He has been placed on hydralazine and nitrates. His Pravastatin was changed to atorvastatin. F/u echo demonstrates recovered LVF (EF 60-65%). He is doing well since d/c. No further chest pain. Has poor balance and decreased energy since his prior stroke. Denies significant dyspnea. No orthopnea, PND, edema. No syncope. He was seen by Dr. Fletcher [name] in August 2014 who felt that the patient did not have any significant peripheral arterial occlusive disease. His ankle-brachial indices were almost normal. No further evaluation was indicated  and the patient feels that his leg weakness probably is a result of his old stroke. He walks with a cane. The patient had an episode of syncope in October, 2015 . It was associated with dehydration. He was observed overnight and treated with IV fluids and released the next day. The patient has not had any recurrent stroke symptoms. He has not been experiencing any chest pain or evidence of myocardial ischemia. He has not had any recurrence of deep vein thrombosis. He remains on long-term warfarin. This is followed at his PCP office.  the patient will be moving to an assisted living facility near [place] next week.  At that point he will be getting his blood work drawn at his assisted living facility   the patient has not been experiencing any recent chest pains.  He has had a lot of problems with constipation.  I suggested that he add Colace stool softener 2 tablets daily and drink plenty of water.  He already has MiraLAX on hand.   Past Medical History  Diagnosis Date  . Diabetes mellitus Onset approx 1980  . Hyperlipidemia   . Carotid artery occlusion   . Calf DVT (deep venous thrombosis) 2010    a. hx of right calf - chronic coumadin since 12/2008.  Recurrence shortly after d/c of coumadin 03/2013--coumadin restarted.  Marland Kitchen PVD (peripheral vascular disease)     ?Tibial dz? Re-eval 01/29/13 by Vascular MD concluded that no significant evidence of PAD was present.  . Coronary artery disease     a. s/p MI & CABG in 2000;  b. 04/2012 Cath/PTCA:  VG-> RI patent w/ 99 in  native RI (PTCA only );  c.  LHC 09/20/12:  oLM 30%, mid LAD occluded, prox D1 80%, mid CFX 100, OM1 30, prox RCA 100, S-RI, native RI 80% (stable), S-OM patent, native OM2 100 (filled via R collats) - culprit, L-LAD patent, S-RCA mid 50%. - med rx; e. 11/2012 NSTEMI->med rx.  Marland Kitchen Hx pulmonary embolism 2010    a. bilateral;  b. Chronic coumadin since 12/2008  . Stroke 2010    [facility].  . Ischemic  cardiomyopathy     a. 04/2012 Echo: EF 40-45%.;  b.  Echo 5/14: EF 60-65%, NL WM, Gr 1 DD  . Hypertension   . Osteoarthritis     Low back and left knee and both hips  . CKD (chronic kidney disease)     left sided chronic high grade obstruction on NM renal imaging (09/2011)--9% function compared to 90% function on right side.  . Nephrolithiasis     with left ureterolithiasis; hx of ureteral stent on left--has resulted in markedly decreased function of left kidney.  . Diabetic retinopathy associated with type 2 diabetes mellitus   . Squamous cell carcinoma 2008    on his back  . Insomnia   . History of pneumonia   . Diabetic peripheral neuropathy     was on neurontin at one time    Past Surgical History  Procedure Laterality Date  . Coronary artery bypass graft  2000    4 vessel cabg in [place], [place].  . Cardiac catheterization  04/2012    patent grafts, severe three-vessel CAD, severe diffuse small vessel disease and residual CAD. He underwent angioplasty alone to a 99% ramus intermedius lesion with resultant dissection and 70% restenosis.  . Carotid endarterectomy  2000    bilateral  . Lithotripsy      (Dr. Jeffie [name])  . Cardiac catheterization  09/2012    Occlusion of the native OM 2 with right-to-left collateral filling, prior balloon angioplasty site unchanged, SVG-RCA was unchanged, mildly elevated LVEDP  . Colonoscopy  2004    Dr. Ysidro [name] in [place], [place].  . Transthoracic echocardiogram  01/2014    Mild LVH, normal LV systolic fxn ([facility])  . Carot dopp  01/2014    1-39% sten of ICA's bilat ([facility])  . Left heart catheterization with coronary/graft angiogram N/A 05/01/2012    Procedure: LEFT HEART CATHETERIZATION WITH Wesley [name];  Surgeon: Wesley Mocha, MD;  Location[facility] C[facility] LAB;  Service: Cardiovascular;  Laterality: N/A;  Possible angioplasty  . Left heart catheterization with coronary/graft angiogram N/A 09/20/2012    Procedure:  LEFT HEART CATHETERIZATION WITH Wesley [name];  Surgeon: Wesley Hampshire, MD;  Location: [facility] CATH LAB;  Service: Cardiovascular;  Laterality: N/A;     Current Outpatient Prescriptions  Medication Sig Dispense Refill  . atorvastatin (LIPITOR) 80 MG tablet TAKE 1 TABLET BY MOUTH EVERY DAY 90 tablet 3  . carvedilol (COREG) 6.25 MG tablet TAKE 1 TABLET (6.25 MG TOTAL) BY MOUTH 2 (TWO) TIMES DAILY WITH A MEAL. 180 tablet 3  . diphenhydramine-acetaminophen (TYLENOL PM) 25-500 MG TABS Take 2 tablets by mouth at bedtime.    . docusate sodium (COLACE) 100 MG capsule Take 200 mg by mouth daily.    . fish oil-omega-3 fatty acids 1000 MG capsule Take 1 g by mouth daily.     . hydrALAZINE (APRESOLINE) 25 MG tablet TAKE 1 TABLET (25 MG TOTAL) BY MOUTH 2 (TWO) TIMES DAILY. 180 tablet 3  . insulin glargine (LANTUS)  100 UNIT/ML injection Inject 0.3 mLs (30 Units total) into the skin every morning. 10 mL 3  . isosorbide mononitrate (IMDUR) 120 MG 24 hr tablet TAKE 1 TABLET (120 MG TOTAL) BY MOUTH DAILY. 90 tablet 3  . nitroGLYCERIN (NITROSTAT) 0.4 MG SL tablet Place 0.4 mg under the tongue every 5 (five) minutes x 3 doses as needed for chest pain.    . polyethylene glycol (MIRALAX / GLYCOLAX) packet Take 17 g by mouth daily. 30 each 0  . warfarin (COUMADIN) 5 MG tablet TAKE 1 TABLET (5 MG TOTAL) BY MOUTH EVERY EVENING. AS DIRECTED BY DR. Modena Morrow 120 tablet 1   No current facility-administered medications for this visit.    Allergies:   Doxycycline; Morphine and related; and Niacin and related    Social History:  The patient  reports that he has never smoked. He has never used smokeless tobacco. He reports that he does not drink alcohol or use illicit drugs.   Family History:  The patient's family history includes Diabetes in his brother, sister, and sister; Other in his father; Stroke in his mother and sister.    ROS:  Please see the history of present illness.   Otherwise, review of systems are  positive for none.   All other systems are reviewed and negative.    PHYSICAL EXAM: VS:  BP 140/70 mmHg  Pulse 64  Ht 6\' 2"  (1.88 m)  Wt 196 lb (88.905 kg)  BMI 25.15 kg/m2 , BMI Body mass index is 25.15 kg/(m^2). GEN: Well nourished, well developed, in no acute distress HEENT: normal Neck: no JVD, carotid bruits, or masses Cardiac:normal sinus rhythm; no murmurs, rubs, or gallops,no edema  Respiratory:  clear to auscultation bilaterally, normal work of breathing GI: soft, nontender, nondistended, + BS MS: no deformity or atrophy Skin: warm and dry, no rash Neuro:  Strength and sensation are intact Psych: euthymic mood, full affect   EKG:  EKG is ordered today. The ekg ordered today demonstrates   normal sinus rhythm and a pattern of an old inferior wall myocardial infarction    Recent Labs: 11/27/2014: ALT 9; BUN 21; Creatinine, Ser 1.39; Hemoglobin 13.7; Platelets 163.0; Potassium 3.9; Sodium 137    Lipid Panel    Component Value Date/Time   CHOL 136 11/13/2012 0910   TRIG 218.0* 11/13/2012 0910   HDL 28.40* 11/13/2012 0910   CHOLHDL 5 11/13/2012 0910   VLDL 43.6* 11/13/2012 0910   LDLCALC 123* 09/19/2012 0301   LDLDIRECT 73.7 11/13/2012 0910      Wt Readings from Last 3 Encounters:  12/02/14 196 lb (88.905 kg)  11/27/14 194 lb 12 oz (88.338 kg)  10/01/14 200 lb (90.719 kg)       ASSESSMENT AND PLAN:  1. Ischemic heart disease. Stable. The patient has not been experiencing any recurrent chest pain or angina. 2. Hypercholesterolemia. The patient is on Lipitor and omega-3 fatty acids. His lipids are followed by his PCP. 3. Essential hypertension without heart failure. Blood pressures remained stable on current therapy 4. History of prior strokes and prior DVT with pulmonary emboli. On long-term anticoagulation. On warfarin.  Plan: Continue warfarin. this will be followed at his new assisted living facility  Recheck in one year for follow-up office  visit and EKG.   Current medicines are reviewed at length with the patient today.  The patient does not have concerns regarding medicines.  The following changes have been made:  no change  Labs/ tests ordered today include:  Orders Placed This Encounter  Procedures  . EKG 12-Lead      Signed, Wesley Coco MD 12/02/2014 5:18 PM    [facility] [place], [place], [place]  [address] Phone: [phone]; Fax: [phone]

## 2014-12-04 ENCOUNTER — Ambulatory Visit (INDEPENDENT_AMBULATORY_CARE_PROVIDER_SITE_OTHER): Payer: Medicare Other | Admitting: Nurse Practitioner

## 2014-12-04 ENCOUNTER — Encounter: Payer: Self-pay | Admitting: Nurse Practitioner

## 2014-12-04 VITALS — BP 88/52 | HR 69 | Temp 98.4°F | Resp 16

## 2014-12-04 DIAGNOSIS — E119 Type 2 diabetes mellitus without complications: Secondary | ICD-10-CM | POA: Diagnosis not present

## 2014-12-04 DIAGNOSIS — I9589 Other hypotension: Secondary | ICD-10-CM | POA: Diagnosis not present

## 2014-12-04 DIAGNOSIS — I10 Essential (primary) hypertension: Secondary | ICD-10-CM | POA: Diagnosis not present

## 2014-12-04 DIAGNOSIS — F05 Delirium due to known physiological condition: Secondary | ICD-10-CM | POA: Insufficient documentation

## 2014-12-04 DIAGNOSIS — R791 Abnormal coagulation profile: Secondary | ICD-10-CM | POA: Insufficient documentation

## 2014-12-04 DIAGNOSIS — Z0279 Encounter for issue of other medical certificate: Secondary | ICD-10-CM

## 2014-12-04 DIAGNOSIS — F4321 Adjustment disorder with depressed mood: Secondary | ICD-10-CM

## 2014-12-04 MED ORDER — SERTRALINE HCL 25 MG PO TABS: 25.0000 mg | ORAL_TABLET | Freq: Every day | ORAL | Status: DC

## 2014-12-04 MED ORDER — ISOSORBIDE MONONITRATE ER 60 MG PO TB24: 60.0000 mg | ORAL_TABLET | Freq: Every day | ORAL | Status: DC

## 2014-12-04 MED ORDER — INSULIN GLARGINE 100 UNIT/ML ~~LOC~~ SOLN: 20.0000 [IU] | Freq: Every morning | SUBCUTANEOUS | Status: DC

## 2014-12-04 NOTE — Progress Notes (Signed)
Pre visit review using our clinic review tool, if applicable. No additional management support is needed unless otherwise documented below in the visit note. 

## 2014-12-04 NOTE — Progress Notes (Addendum)
Subjective:     Wesley [name] is a 81 y.o. male who presents for follow up of dizziness. He is accompanied by daughter today.  Pt was seen in [place] last week with dizziness & nausea. He had not had BM in1 Week & Weight had dropped 6 pounds in 8 weeks.  Lab w/u: CBC, CMET, urine culture, ua dip, INR revealed mild dehydration & INR was too high. Pt was advised to increase fluids & take miralax for BM.  He saw cardiology 2 dys ago. No changes to plan. He is to f/u 1 yr. Today pt says dizzyness comes & goes-usually feels better after he eats crackers. He c/o persistent weakness in LE such that walking is difficult even with walker.  BP today is very low. When asked about taking meds, daughter seems overwhelmed & patient isn't sure what he is taking.  Daughter is concerned about pt was extremely fatigued yesterday after running errands-had to lay down. Daughter voices concern over pt demonstrating excessive worry and confusion: she & pt went to bank yesterday to designate her as financial POA. Pt was unable to comprhend dialogue & papers were not signed.   Plans are to go to [place] next week. She is asking for FL2 to be completed.  Daughter is going out of town over weekend. She will have another family member Data processing manager with [facility] [name].  The following portions of the patient's history were reviewed and updated as appropriate: allergies, current medications, past medical history, past social history, past surgical history and problem list.  Review of Systems Pertinent items are noted in HPI.    Objective:    BP 88/52 mmHg  Pulse 69  Temp(Src) 98.4 F (36.9 C) (Temporal)  Resp 16  SpO2 96% General appearance: alert, cooperative, appears stated age and slowed mentation Eyes: negative findings: lids and lashes normal and conjunctivae and sclerae normal Lungs: clear to auscultation bilaterally Heart: regular rate and rhythm, S1, S2 normal, no murmur, click, rub or  gallop Neurologic: Gait: not observed-pt in wheel chair      Assessment:Plan  1. Adjustment disorder with depressed mood New start - sertraline (ZOLOFT) 25 MG tablet; Take 1 tablet (25 mg total) by mouth daily.  Dispense: 30 tablet; Refill: 0  2. Essential hypertension Hypotensive today, Decrease dose start - isosorbide mononitrate (IMDUR) 60 MG 24 hr tablet; Take 1 tablet (60 mg total) by mouth daily.  Dispense: 30 tablet; Refill: 0  3. Type 2 diabetes mellitus without complication Decrease dose to 20 u qd from 30 u qd - insulin glargine (LANTUS) 100 UNIT/ML injection; Inject 0.2 mLs (20 Units total) into the skin every morning.  Dispense: 10 mL; Refill: 3  4. Acute confusional state Possibly due to time of transition-leaving home to go to nursing facility Will request [facility] RN to provide medication management for med changes today.  5. Other specified hypotension Decrease imdur  6. Elevated INR Continue to take 5 mg T daily except M & F. For 1 more week. INR should be checked 12/12/14.  F/u PRN-daughter states health care provider at [facility] will assume care.

## 2014-12-04 NOTE — Patient Instructions (Addendum)
Start new prescription of IMDUR 60 mg. Take at night.  Continue hydralazine & coreg in mornings.  Continue to take coumadin as follows for 1 more week, then have INR checked on 7/8: 5 mg Tablet daily except MON & FRI . MON & FRI take 1/2 Tablet.  Drink an ensure or Boost every day.  Start zoloft. Take at night. If it keeps you awake, take in mornings. This is for anxiety.  Decrease lantus to 20 units daily.  We will notify you when FL2 form can be picked up.

## 2014-12-09 ENCOUNTER — Telehealth: Payer: Self-pay | Admitting: Family Medicine

## 2014-12-09 ENCOUNTER — Ambulatory Visit (INDEPENDENT_AMBULATORY_CARE_PROVIDER_SITE_OTHER): Payer: Medicare Other | Admitting: *Deleted

## 2014-12-09 DIAGNOSIS — Z111 Encounter for screening for respiratory tuberculosis: Secondary | ICD-10-CM | POA: Diagnosis not present

## 2014-12-09 NOTE — Telephone Encounter (Signed)
Patient needs FL2 today. He is moving in to [place] today. He is coming in for TB skin test so he can take it with him or we can fax it.

## 2014-12-09 NOTE — Telephone Encounter (Signed)
Copy of FL2 give to pts daughter.

## 2014-12-11 ENCOUNTER — Telehealth: Payer: Self-pay | Admitting: *Deleted

## 2014-12-11 NOTE — Telephone Encounter (Signed)
Evette with [facility] called LMOM stating that she needs some new orders for pt. She stated that pt told them he takes tylenol pm every night, need order for this. She also stated that Miralax was not listed on pts FL@ will need order for Miralax. Please write on script so this can be faxed it. Thanks.

## 2014-12-11 NOTE — Telephone Encounter (Signed)
Rx faxed

## 2014-12-11 NOTE — Telephone Encounter (Signed)
OK Rx written

## 2014-12-14 ENCOUNTER — Emergency Department (HOSPITAL_COMMUNITY): Payer: Medicare Other

## 2014-12-14 ENCOUNTER — Emergency Department (HOSPITAL_COMMUNITY)
Admission: EM | Admit: 2014-12-14 | Discharge: 2014-12-14 | Disposition: A | Discharge status: Home / Self Care | Payer: Medicare Other | Attending: Emergency Medicine | Admitting: Emergency Medicine

## 2014-12-14 ENCOUNTER — Encounter (HOSPITAL_COMMUNITY): Payer: Self-pay | Admitting: *Deleted

## 2014-12-14 DIAGNOSIS — Z794 Long term (current) use of insulin: Secondary | ICD-10-CM | POA: Insufficient documentation

## 2014-12-14 DIAGNOSIS — Z79899 Other long term (current) drug therapy: Secondary | ICD-10-CM | POA: Diagnosis not present

## 2014-12-14 DIAGNOSIS — E1142 Type 2 diabetes mellitus with diabetic polyneuropathy: Secondary | ICD-10-CM | POA: Diagnosis not present

## 2014-12-14 DIAGNOSIS — I129 Hypertensive chronic kidney disease with stage 1 through stage 4 chronic kidney disease, or unspecified chronic kidney disease: Secondary | ICD-10-CM | POA: Diagnosis not present

## 2014-12-14 DIAGNOSIS — E785 Hyperlipidemia, unspecified: Secondary | ICD-10-CM | POA: Diagnosis not present

## 2014-12-14 DIAGNOSIS — Z8673 Personal history of transient ischemic attack (TIA), and cerebral infarction without residual deficits: Secondary | ICD-10-CM | POA: Diagnosis not present

## 2014-12-14 DIAGNOSIS — E119 Type 2 diabetes mellitus without complications: Secondary | ICD-10-CM | POA: Insufficient documentation

## 2014-12-14 DIAGNOSIS — G4700 Insomnia, unspecified: Secondary | ICD-10-CM | POA: Insufficient documentation

## 2014-12-14 DIAGNOSIS — Z8701 Personal history of pneumonia (recurrent): Secondary | ICD-10-CM | POA: Insufficient documentation

## 2014-12-14 DIAGNOSIS — I739 Peripheral vascular disease, unspecified: Secondary | ICD-10-CM | POA: Diagnosis not present

## 2014-12-14 DIAGNOSIS — R079 Chest pain, unspecified: Secondary | ICD-10-CM | POA: Diagnosis not present

## 2014-12-14 DIAGNOSIS — Z86718 Personal history of other venous thrombosis and embolism: Secondary | ICD-10-CM | POA: Insufficient documentation

## 2014-12-14 DIAGNOSIS — Z86711 Personal history of pulmonary embolism: Secondary | ICD-10-CM | POA: Insufficient documentation

## 2014-12-14 DIAGNOSIS — E11319 Type 2 diabetes mellitus with unspecified diabetic retinopathy without macular edema: Secondary | ICD-10-CM | POA: Diagnosis not present

## 2014-12-14 DIAGNOSIS — Z87442 Personal history of urinary calculi: Secondary | ICD-10-CM | POA: Insufficient documentation

## 2014-12-14 DIAGNOSIS — I2510 Atherosclerotic heart disease of native coronary artery without angina pectoris: Secondary | ICD-10-CM | POA: Insufficient documentation

## 2014-12-14 DIAGNOSIS — M1990 Unspecified osteoarthritis, unspecified site: Secondary | ICD-10-CM | POA: Diagnosis not present

## 2014-12-14 DIAGNOSIS — Z9889 Other specified postprocedural states: Secondary | ICD-10-CM | POA: Insufficient documentation

## 2014-12-14 DIAGNOSIS — Z7901 Long term (current) use of anticoagulants: Secondary | ICD-10-CM | POA: Insufficient documentation

## 2014-12-14 DIAGNOSIS — G629 Polyneuropathy, unspecified: Secondary | ICD-10-CM | POA: Diagnosis not present

## 2014-12-14 DIAGNOSIS — N189 Chronic kidney disease, unspecified: Secondary | ICD-10-CM | POA: Diagnosis not present

## 2014-12-14 DIAGNOSIS — Z951 Presence of aortocoronary bypass graft: Secondary | ICD-10-CM | POA: Diagnosis not present

## 2014-12-14 DIAGNOSIS — Z85828 Personal history of other malignant neoplasm of skin: Secondary | ICD-10-CM | POA: Insufficient documentation

## 2014-12-14 DIAGNOSIS — R0789 Other chest pain: Secondary | ICD-10-CM | POA: Diagnosis not present

## 2014-12-14 DIAGNOSIS — I499 Cardiac arrhythmia, unspecified: Secondary | ICD-10-CM | POA: Diagnosis not present

## 2014-12-14 LAB — COMPREHENSIVE METABOLIC PANEL
ALBUMIN: 3.3 g/dL — AB (ref 3.5–5.0)
ALT: 15 U/L — ABNORMAL LOW (ref 17–63)
ANION GAP: 8 (ref 5–15)
AST: 20 U/L (ref 15–41)
Alkaline Phosphatase: 64 U/L (ref 38–126)
BUN: 19 mg/dL (ref 6–20)
CALCIUM: 8.8 mg/dL — AB (ref 8.9–10.3)
CO2: 25 mmol/L (ref 22–32)
CREATININE: 1.46 mg/dL — AB (ref 0.61–1.24)
Chloride: 105 mmol/L (ref 101–111)
GFR calc Af Amer: 50 mL/min — ABNORMAL LOW (ref >60)
GFR calc non Af Amer: 43 mL/min — ABNORMAL LOW (ref >60)
Glucose, Bld: 122 mg/dL — ABNORMAL HIGH (ref 65–99)
Potassium: 3.8 mmol/L (ref 3.5–5.1)
Sodium: 138 mmol/L (ref 135–145)
Total Bilirubin: 1 mg/dL (ref 0.3–1.2)
Total Protein: 6.5 g/dL (ref 6.5–8.1)

## 2014-12-14 LAB — I-STAT TROPONIN, ED
Troponin i, poc: 0.02 ng/mL (ref 0.00–0.08)
Troponin i, poc: 0.02 ng/mL (ref 0.00–0.08)

## 2014-12-14 LAB — CBC WITH DIFFERENTIAL/PLATELET
Basophils Absolute: 0 10*3/uL (ref 0.0–0.1)
Basophils Relative: 0 % (ref 0–1)
Eosinophils Absolute: 0.1 10*3/uL (ref 0.0–0.7)
Eosinophils Relative: 2 % (ref 0–5)
HCT: 38.7 % — ABNORMAL LOW (ref 39.0–52.0)
Hemoglobin: 13.4 g/dL (ref 13.0–17.0)
LYMPHS PCT: 25 % (ref 12–46)
Lymphs Abs: 1.8 10*3/uL (ref 0.7–4.0)
MCH: 31.1 pg (ref 26.0–34.0)
MCHC: 34.6 g/dL (ref 30.0–36.0)
MCV: 89.8 fL (ref 78.0–100.0)
Monocytes Absolute: 0.6 10*3/uL (ref 0.1–1.0)
Monocytes Relative: 8 % (ref 3–12)
NEUTROS ABS: 4.8 10*3/uL (ref 1.7–7.7)
Neutrophils Relative %: 65 % (ref 43–77)
PLATELETS: 126 10*3/uL — AB (ref 150–400)
RBC: 4.31 MIL/uL (ref 4.22–5.81)
RDW: 14.3 % (ref 11.5–15.5)
WBC: 7.3 10*3/uL (ref 4.0–10.5)

## 2014-12-14 LAB — PROTIME-INR
INR: 2.84 — AB (ref 0.00–1.49)
PROTHROMBIN TIME: 29.3 s — AB (ref 11.6–15.2)

## 2014-12-14 MED ORDER — NITROGLYCERIN 2 % TD OINT
0.5000 [in_us] | TOPICAL_OINTMENT | Freq: Once | TRANSDERMAL | Status: AC
  Administered 2014-12-14: 0.5 [in_us] via TOPICAL
  Filled 2014-12-14: qty 1

## 2014-12-14 NOTE — Discharge Instructions (Signed)
Continue your regular home medications as prescribed. Follow-up with Dr. Mare Ferrari-- call to schedule appointment. Return to the ED for new or worsening symptoms.

## 2014-12-14 NOTE — Consult Note (Addendum)
Reason for Consult:chest pain  Referring Physician: ER MD  Wesley [name] is an 81 y.o. male.   HPI: The patient is an 81 yo man with known CAD who was admitted with chest pain. He took ntg in the ER and his pain has resolved. He ruled out for MI. He had catheterization 2 years ago after NSTEMI and treated medically. No targets for revascularization. He feels well and wants to go home. He has ntg at home. Unclear why he did not take this medication.   PMH: Past Medical History  Diagnosis Date  . Diabetes mellitus Onset approx 1980  . Hyperlipidemia   . Carotid artery occlusion   . Calf DVT (deep venous thrombosis) 2010    a. hx of right calf - chronic coumadin since 12/2008.  Recurrence shortly after d/c of coumadin 03/2013--coumadin restarted.  Marland Kitchen PVD (peripheral vascular disease)     ?Tibial dz? Re-eval 01/29/13 by Vascular MD concluded that no significant evidence of PAD was present.  . Coronary artery disease     a. s/p MI & CABG in 2000;  b. 04/2012 Cath/PTCA:  VG-> RI patent w/ 99 in native RI (PTCA only );  c.  LHC 09/20/12:  oLM 30%, mid LAD occluded, prox D1 80%, mid CFX 100, OM1 30, prox RCA 100, S-RI, native RI 80% (stable), S-OM patent, native OM2 100 (filled via R collats) - culprit, L-LAD patent, S-RCA mid 50%. - med rx; e. 11/2012 NSTEMI->med rx.  Marland Kitchen Hx pulmonary embolism 2010    a. bilateral;  b. Chronic coumadin since 12/2008  . Stroke 2010    [name]  . Ischemic cardiomyopathy     a. 04/2012 Echo: EF 40-45%.;  b.  Echo 5/14: EF 60-65%, NL WM, Gr 1 DD  . Hypertension   . Osteoarthritis     Low back and left knee and both hips  . CKD (chronic kidney disease)     left sided chronic high grade obstruction on NM renal imaging (09/2011)--9% function compared to 90% function on right side.  . Nephrolithiasis     with left ureterolithiasis; hx of ureteral stent on left--has resulted in markedly decreased function of left kidney.  . Diabetic retinopathy  associated with type 2 diabetes mellitus   . Squamous cell carcinoma 2008    on his back  . Insomnia   . History of pneumonia   . Diabetic peripheral neuropathy     was on neurontin at one time    PSHX: Past Surgical History  Procedure Laterality Date  . Coronary artery bypass graft  2000    4 vessel cabg in [place], [place].  . Cardiac catheterization  04/2012    patent grafts, severe three-vessel CAD, severe diffuse small vessel disease and residual CAD. He underwent angioplasty alone to a 99% ramus intermedius lesion with resultant dissection and 70% restenosis.  . Carotid endarterectomy  2000    bilateral  . Lithotripsy      (Wesley [name])  . Cardiac catheterization  09/2012    Occlusion of the native OM 2 with right-to-left collateral filling, prior balloon angioplasty site unchanged, SVG-RCA was unchanged, mildly elevated LVEDP  . Colonoscopy  2004    [name] in [place], [place].  . Transthoracic echocardiogram  01/2014    Mild LVH, normal LV systolic fxn ([facility])  . Carot dopp  01/2014    1-39% sten of ICA's bilat ([facility])  . Left heart catheterization with coronary/graft angiogram N/A  05/01/2012    Procedure: LEFT HEART CATHETERIZATION WITH Wesley [name];  Surgeon: Wesley Mocha, MD;  Location[facility] C[facility] LAB;  Service: Cardiovascular;  Laterality: N/A;  Possible angioplasty  . Left heart catheterization with coronary/graft angiogram N/A 09/20/2012    Procedure: LEFT HEART CATHETERIZATION WITH Wesley [name];  Surgeon: Wesley Hampshire, MD;  Location: [facility] CATH LAB;  Service: Cardiovascular;  Laterality: N/A;    FAMHX: Family History  Problem Relation Age of Onset  . Stroke Mother     age 53  . Other Father     pulmonary embolus age 89  . Diabetes Sister   . Stroke Sister   . Diabetes Brother     brother was murdered  . Diabetes Sister     Social History:  reports that he has never smoked. He has never used smokeless  tobacco. He reports that he does not drink alcohol or use illicit drugs.  Allergies:  Allergies  Allergen Reactions  . Doxycycline Swelling    swelling  . Morphine And Related Nausea Only    NAUSEA    . Niacin And Related Other (See Comments)    Hot flashes  . Zoloft [Sertraline Hcl]     Listed on MAR    Medications: I have reviewed the patient's current medications.  Dg Chest 2 View  12/14/2014   CLINICAL DATA:  Chest pain.  EXAM: CHEST  2 VIEW  COMPARISON:  Oct 21, 2014.  FINDINGS: The heart size and mediastinal contours are within normal limits. No pneumothorax or pleural effusion is noted. Sternotomy wires are noted. Right lung is clear. Minimal left basilar subsegmental atelectasis is noted. The visualized skeletal structures are unremarkable.  IMPRESSION: Minimal left basilar subsegmental atelectasis.   Electronically Signed   By: Wesley [name], M.D.   On: 12/14/2014 15:02    ROS  As stated in the HPI and negative for all other systems.  Physical Exam  Vitals:Blood pressure 185/75, pulse 63, temperature 98.3 F (36.8 C), temperature source Oral, resp. rate 17, height 6\' 2"  (1.88 m), weight 196 lb (88.905 kg), SpO2 100 %.  Well appearing 81 yo man looking younger than stated age, NAD HEENT: Unremarkable Neck:  7 cm JVD, no thyromegally Back:  No CVA tenderness Lungs:  Clear with no wheezes HEART:  Regular rate rhythm, no murmurs, no rubs, no clicks Abd:  Flat, positive bowel sounds, no organomegally, no rebound, no guarding Ext:  2 plus pulses, no edema, no cyanosis, no clubbing Skin:  No rashes no nodules Neuro:  CN II through XII intact, motor grossly intact  ECG - nsr, no acute STT changes  Assessment/Plan: 1. Chest pain relieved with sl ntg - he has ruled out for MI and would like to go home. I have asked him to take his medications and to use sublingual ntg as needed for recurrent chest pain. He is instructed to call Wesley [name] to schedule followup in a few  weeks. 2. Known CAD - he will continue his current medications. He is likely medical therapy only.  Wesley Overlie TaylorMD 12/14/2014, 5:07 PM

## 2014-12-14 NOTE — ED Notes (Signed)
Pt eating happy meal.  Family called to pick up pt.

## 2014-12-14 NOTE — ED Notes (Signed)
Pt sent here from [facility] facility for L sided chest pain that radiates to L arm.  Pain was acute onset at 1245.  324 asa and 2 0.4 SL nitro given by EMS that reduced pain from 10/10 to 5/10.  Pt also c/o nausea and some sob.

## 2014-12-14 NOTE — ED Provider Notes (Signed)
CSN: [id number]     Arrival date & time 12/14/14  1351 History   First MD Initiated Contact with Patient 12/14/14 1352     Chief Complaint  Patient presents with  . Chest Pain     (Consider location/radiation/quality/duration/timing/severity/associated sxs/prior Treatment) Patient is a 81 y.o. male presenting with chest pain. The history is provided by the patient and medical records.  Chest Pain   This is a 81 year old male with past medical history significant for diabetes, hyperlipidemia, coronary artery disease, history of DVT and PE on chronic Coumadin therapy, prior stroke, hypertension, MI x5, presenting to the ED for chest pain. Patient states he sat down to eat his lunch and before he could begin eating he developed pain in the left side of his chest with radiation to his left shoulder. He reports he had some shortness of breath, states this has improved at this time. He continues to have some nausea but denies vomiting. He denies dizziness or weakness. No recent cough, fever, chills. Patient with extensive cardiac history, followed by Dr. Mare Ferrari.  States current pain is very similar to pain with his prior heart attacks.  Patient was given 325 mg aspirin and 2 sublingual nitroglycerin and route with EMS, improvement of his pain from 10/10 to 5/10 currently.  Vital signs stable on arrival.  Past Medical History  Diagnosis Date  . Diabetes mellitus Onset approx 1980  . Hyperlipidemia   . Carotid artery occlusion   . Calf DVT (deep venous thrombosis) 2010    a. hx of right calf - chronic coumadin since 12/2008.  Recurrence shortly after d/c of coumadin 03/2013--coumadin restarted.  Marland Kitchen PVD (peripheral vascular disease)     ?Tibial dz? Re-eval 01/29/13 by Vascular MD concluded that no significant evidence of PAD was present.  . Coronary artery disease     a. s/p MI & CABG in 2000;  b. 04/2012 Cath/PTCA:  VG-> RI patent w/ 99 in native RI (PTCA only );  c.  LHC 09/20/12:  oLM 30%, mid LAD  occluded, prox D1 80%, mid CFX 100, OM1 30, prox RCA 100, S-RI, native RI 80% (stable), S-OM patent, native OM2 100 (filled via R collats) - culprit, L-LAD patent, S-RCA mid 50%. - med rx; e. 11/2012 NSTEMI->med rx.  Marland Kitchen Hx pulmonary embolism 2010    a. bilateral;  b. Chronic coumadin since 12/2008  . Stroke 2010    [facility].  . Ischemic cardiomyopathy     a. 04/2012 Echo: EF 40-45%.;  b.  Echo 5/14: EF 60-65%, NL WM, Gr 1 DD  . Hypertension   . Osteoarthritis     Low back and left knee and both hips  . CKD (chronic kidney disease)     left sided chronic high grade obstruction on NM renal imaging (09/2011)--9% function compared to 90% function on right side.  . Nephrolithiasis     with left ureterolithiasis; hx of ureteral stent on left--has resulted in markedly decreased function of left kidney.  . Diabetic retinopathy associated with type 2 diabetes mellitus   . Squamous cell carcinoma 2008    on his back  . Insomnia   . History of pneumonia   . Diabetic peripheral neuropathy     was on neurontin at one time   Past Surgical History  Procedure Laterality Date  . Coronary artery bypass graft  2000    4 vessel cabg in [place], [place].  . Cardiac catheterization  04/2012    patent grafts, severe three-vessel  CAD, severe diffuse small vessel disease and residual CAD. He underwent angioplasty alone to a 99% ramus intermedius lesion with resultant dissection and 70% restenosis.  . Carotid endarterectomy  2000    bilateral  . Lithotripsy      (Dr. Jeffie Pollock)  . Cardiac catheterization  09/2012    Occlusion of the native OM 2 with right-to-left collateral filling, prior balloon angioplasty site unchanged, SVG-RCA was unchanged, mildly elevated LVEDP  . Colonoscopy  2004    Dr. Ysidro Evert in [place], [place].  . Transthoracic echocardiogram  01/2014    Mild LVH, normal LV systolic fxn ([facility])  . Carot dopp  01/2014    1-39% sten of ICA's bilat ([facility])  . Left heart catheterization with coronary/graft angiogram N/A 05/01/2012    Procedure: LEFT HEART CATHETERIZATION WITH Beatrix Fetters;  Surgeon: Sherren Mocha, MD;  Location: [facility] CATH LAB;  Service: Cardiovascular;  Laterality: N/A;  Possible angioplasty  . Left heart catheterization with coronary/graft angiogram N/A 09/20/2012    Procedure: LEFT HEART CATHETERIZATION WITH Beatrix Fetters;  Surgeon: Wellington Hampshire, MD;  Location: [facility] CATH LAB;  Service: Cardiovascular;  Laterality: N/A;   Family History  Problem Relation Age of Onset  . Stroke Mother     age 70  . Other Father     pulmonary embolus age 82  . Diabetes Sister   . Stroke Sister   . Diabetes Brother     brother was murdered  . Diabetes Sister    History  Substance Use Topics  . Smoking status: Never Smoker   . Smokeless tobacco: Never Used  . Alcohol Use: No    Review of Systems  Cardiovascular: Positive for chest pain.  All other systems reviewed and are negative.     Allergies  Doxycycline; Morphine and related; and Niacin and related  Home Medications   Prior to Admission medications   Medication Sig Start Date End Date Taking? Authorizing Provider  atorvastatin (LIPITOR) 80 MG tablet TAKE 1 TABLET BY MOUTH EVERY DAY 09/30/14   Darlin Coco, MD  carvedilol (COREG) 6.25 MG tablet TAKE 1 TABLET (6.25 MG TOTAL) BY MOUTH 2 (TWO) TIMES DAILY WITH A MEAL. 06/25/14   Darlin Coco, MD  diphenhydramine-acetaminophen (TYLENOL PM) 25-500 MG TABS Take 2 tablets by mouth at bedtime.    Historical Provider, MD  docusate sodium (COLACE) 100 MG capsule Take 200 mg by mouth daily.    Historical Provider, MD  fish oil-omega-3 fatty acids 1000 MG capsule Take 1 g by mouth daily.     Historical Provider, MD  hydrALAZINE (APRESOLINE) 25 MG tablet TAKE 1 TABLET (25 MG TOTAL) BY MOUTH 2 (TWO) TIMES DAILY. 06/25/14   Darlin Coco, MD  insulin glargine (LANTUS) 100 UNIT/ML injection Inject  0.2 mLs (20 Units total) into the skin every morning. 12/04/14   Irene Pap, NP  isosorbide mononitrate (IMDUR) 60 MG 24 hr tablet Take 1 tablet (60 mg total) by mouth daily. 12/04/14   Irene Pap, NP  nitroGLYCERIN (NITROSTAT) 0.4 MG SL tablet Place 0.4 mg under the tongue every 5 (five) minutes x 3 doses as needed for chest pain. 05/07/12   Rogelia Mire, NP  polyethylene glycol (MIRALAX / GLYCOLAX) packet Take 17 g by mouth daily. 10/01/14   Debby Freiberg, MD  sertraline (ZOLOFT) 25 MG tablet Take 1 tablet (25 mg total) by mouth daily. 12/04/14   Irene Pap, NP  warfarin (COUMADIN) 5 MG tablet TAKE 1 TABLET (  5 MG TOTAL) BY MOUTH EVERY EVENING. AS DIRECTED BY DR. Modena Morrow 06/24/14   Tammi Sou, MD   BP 166/73 mmHg  Pulse 66  Temp(Src) 98.3 F (36.8 C) (Oral)  Resp 17  Ht 6\' 2"  (1.88 m)  Wt 196 lb (88.905 kg)  BMI 25.15 kg/m2  SpO2 99%   Physical Exam  Constitutional: He is oriented to person, place, and time. He appears well-developed and well-nourished. No distress.  Elderly, no acute distress  HENT:  Head: Normocephalic and atraumatic.  Mouth/Throat: Oropharynx is clear and moist.  Eyes: Conjunctivae and EOM are normal. Pupils are equal, round, and reactive to light.  Neck: Normal range of motion. Neck supple.  Cardiovascular: Normal rate, regular rhythm and normal heart sounds.   Pulmonary/Chest: Effort normal and breath sounds normal. No respiratory distress. He has no wheezes.  Abdominal: Soft. Bowel sounds are normal. There is no tenderness. There is no guarding.  Musculoskeletal: Normal range of motion.  No pitting edema  Neurological: He is alert and oriented to person, place, and time.  Skin: Skin is warm and dry. He is not diaphoretic.  Psychiatric: He has a normal mood and affect.  Nursing note and vitals reviewed.   ED Course  Procedures (including critical care time) Labs Review Labs Reviewed  CBC WITH DIFFERENTIAL/PLATELET - Abnormal; Notable  for the following:    HCT 38.7 (*)    Platelets 126 (*)    All other components within normal limits  COMPREHENSIVE METABOLIC PANEL - Abnormal; Notable for the following:    Glucose, Bld 122 (*)    Creatinine, Ser 1.46 (*)    Calcium 8.8 (*)    Albumin 3.3 (*)    ALT 15 (*)    GFR calc non Af Amer 43 (*)    GFR calc Af Amer 50 (*)    All other components within normal limits  PROTIME-INR - Abnormal; Notable for the following:    Prothrombin Time 29.3 (*)    INR 2.84 (*)    All other components within normal limits  I-STAT TROPOININ, ED    Imaging Review Dg Chest 2 View  12/14/2014   CLINICAL DATA:  Chest pain.  EXAM: CHEST  2 VIEW  COMPARISON:  Oct 21, 2014.  FINDINGS: The heart size and mediastinal contours are within normal limits. No pneumothorax or pleural effusion is noted. Sternotomy wires are noted. Right lung is clear. Minimal left basilar subsegmental atelectasis is noted. The visualized skeletal structures are unremarkable.  IMPRESSION: Minimal left basilar subsegmental atelectasis.   Electronically Signed   By: Marijo Conception, M.D.   On: 12/14/2014 15:02     EKG Interpretation   Date/Time:  Sunday December 14 2014 14:08:56 EDT Ventricular Rate:  66 PR Interval:  193 QRS Duration: 97 QT Interval:  443 QTC Calculation: 464 R Axis:   -18 Text Interpretation:  Sinus rhythm Ventricular premature complex  Borderline left axis deviation Borderline low voltage, extremity leads No  significant change was found Confirmed by [facility]  MD, TREY ([id number]) on  12/14/2014 3:27:57 PM      MDM   Final diagnoses:  Chest pain   81 year old male with left-sided chest pain with radiation into left shoulder, onset prior to arrival. Pain is decreased with sublingual nitroglycerin and aspirin. Patient afebrile, nontoxic. He is in no acute distress. States current symptoms are similar to his prior MI's.  EKG is largely unchanged from previous. Lab work and chest x-ray pending. Nitropaste  applied.  3:24 PM After nitro paste, patient now pain free.  States he is feeling much better.  VS remain stable.  Labwork is overall reassuring. INR is therapeutic. Chest x-ray is clear. Given patient's cardiac hx and nature of symptoms today, feel that cardiology input warranted.  Patient states he would really like to go home today if possible-- may be able to do delta trop but will await cardiology recommendations.  3:44 PM Case discussed with cardiology, Dr. Lovena Le-- feels that given negative workup in the ED this for and patient is currently pain-free, can obtain delta troponin and if negative okay to discharge home with close cardiology follow-up this week.  This was discussed with patient, agrees to plan.  Care signed out to Dr. Betsey Holiday pending delta trop-- if negative, may discharge home with close cardiology follow-up, if troponin elevated will need admission.  Larene Pickett, PA-C 12/14/14 [place], MD 12/16/14 [phone]

## 2014-12-14 NOTE — ED Notes (Addendum)
Nitro paste removed per verbal order of Dr Doy Mince.

## 2014-12-15 ENCOUNTER — Telehealth: Payer: Self-pay | Admitting: *Deleted

## 2014-12-15 DIAGNOSIS — S2241XA Multiple fractures of ribs, right side, initial encounter for closed fracture: Secondary | ICD-10-CM | POA: Diagnosis not present

## 2014-12-15 NOTE — Telephone Encounter (Signed)
Dedrica from [facility] called stating that pt fell yesterday while taking a shower, she stated that he did have assistance while showering. She stated that pt c/o chest pain so he was sent to ER for evaluation and he was d/c same day with normal work up. She stated that today pt c/o RUQ pain and swelling she is requesting order from Dr. Anitra Lauth for Right rib series. Per Dr. Anitra Lauth okay to order, order faxed to number provided.

## 2014-12-16 ENCOUNTER — Telehealth: Payer: Self-pay | Admitting: Family Medicine

## 2014-12-16 NOTE — Telephone Encounter (Signed)
Evette at [facility] advised and voiced understanding. She stated that she will talk to one of the providers there and see if they will do the FL2 addendum for pt. She did stated that she will need a order from Dr. Anitra Lauth for 2nd step PPD. Please fax order to [phone]. Thanks.

## 2014-12-16 NOTE — Telephone Encounter (Signed)
Pls notify pt and his caregivers that his x-ray showed rib fractures of the right 10th and 11th ribs. Nothing can be done except give them time to heal on their own. I recommend tylenol for pain.  Anything stronger will have adverse cognitive effects on him and make him more likely to fall again. -thx

## 2014-12-16 NOTE — Telephone Encounter (Signed)
Left message for Dedrica at [facility] to call back about this and FL2 addendum.

## 2014-12-17 ENCOUNTER — Encounter: Payer: Self-pay | Admitting: Family Medicine

## 2014-12-17 DIAGNOSIS — Z111 Encounter for screening for respiratory tuberculosis: Secondary | ICD-10-CM | POA: Insufficient documentation

## 2014-12-17 NOTE — Telephone Encounter (Signed)
Order for PPD placed on your keyboard.

## 2014-12-17 NOTE — Telephone Encounter (Signed)
Order faxed to [facility] 

## 2014-12-18 ENCOUNTER — Telehealth: Payer: Self-pay | Admitting: *Deleted

## 2014-12-18 NOTE — Telephone Encounter (Signed)
Baxter Flattery advised.

## 2014-12-18 NOTE — Telephone Encounter (Signed)
Baxter Flattery with [facility] called requesting verbal order for [facility] due to Falls and Chest Pain. She stated that their MDs are not scheduled to start seeing pt til sometime next week. Please advise. Thanks.

## 2014-12-18 NOTE — Telephone Encounter (Signed)
Verbal order for this is fine with me.-thx

## 2014-12-19 ENCOUNTER — Telehealth: Payer: Self-pay | Admitting: *Deleted

## 2014-12-19 DIAGNOSIS — Z7901 Long term (current) use of anticoagulants: Secondary | ICD-10-CM | POA: Diagnosis not present

## 2014-12-19 DIAGNOSIS — M25562 Pain in left knee: Secondary | ICD-10-CM | POA: Diagnosis not present

## 2014-12-19 DIAGNOSIS — I2510 Atherosclerotic heart disease of native coronary artery without angina pectoris: Secondary | ICD-10-CM | POA: Diagnosis not present

## 2014-12-19 DIAGNOSIS — I255 Ischemic cardiomyopathy: Secondary | ICD-10-CM | POA: Diagnosis not present

## 2014-12-19 DIAGNOSIS — Z86718 Personal history of other venous thrombosis and embolism: Secondary | ICD-10-CM | POA: Diagnosis not present

## 2014-12-19 DIAGNOSIS — I739 Peripheral vascular disease, unspecified: Secondary | ICD-10-CM | POA: Diagnosis not present

## 2014-12-19 DIAGNOSIS — Z8673 Personal history of transient ischemic attack (TIA), and cerebral infarction without residual deficits: Secondary | ICD-10-CM | POA: Diagnosis not present

## 2014-12-19 DIAGNOSIS — Z9181 History of falling: Secondary | ICD-10-CM | POA: Diagnosis not present

## 2014-12-19 DIAGNOSIS — Z86711 Personal history of pulmonary embolism: Secondary | ICD-10-CM | POA: Diagnosis not present

## 2014-12-19 DIAGNOSIS — E119 Type 2 diabetes mellitus without complications: Secondary | ICD-10-CM | POA: Diagnosis not present

## 2014-12-19 DIAGNOSIS — S2241XA Multiple fractures of ribs, right side, initial encounter for closed fracture: Secondary | ICD-10-CM | POA: Diagnosis not present

## 2014-12-19 DIAGNOSIS — Z794 Long term (current) use of insulin: Secondary | ICD-10-CM | POA: Diagnosis not present

## 2014-12-19 DIAGNOSIS — S51801A Unspecified open wound of right forearm, initial encounter: Secondary | ICD-10-CM | POA: Diagnosis not present

## 2014-12-19 DIAGNOSIS — M25561 Pain in right knee: Secondary | ICD-10-CM | POA: Diagnosis not present

## 2014-12-19 NOTE — Telephone Encounter (Signed)
Wayne Both at [facility] requesting verbal order to draw HgA1c. Per Dr. Anitra Lauth okay to order lab for HgA1c, last HgA1c was 11/29/13. Baxter Flattery B advised and voiced understanding.

## 2014-12-19 NOTE — Telephone Encounter (Signed)
Judson Roch called back stating that pts cousin was visiting him yesterday (12/18/14) and pt made a comment to his cousin that he was just going to walk across the street and jump in the river. They have pt hooked up to a wonderguard so that he will not be able to leave his room. She states that she received the order to draw blood for HgA1c but pt would not let her he was very agitated , so now she is requesting a new order be faxed to [phone] for re-attempt HgA1c next Thursday when the lab person is there. Also need a order for triple antibiotic and dry dressing daily to right skin tare. Please advise. Thanks.

## 2014-12-19 NOTE — Telephone Encounter (Signed)
Order and lab results faxed to number provided below.

## 2014-12-19 NOTE — Telephone Encounter (Signed)
Noted. Orders written.

## 2014-12-19 NOTE — Telephone Encounter (Signed)
Order faxed to number provided below

## 2014-12-19 NOTE — Telephone Encounter (Signed)
Recheck PT/INR 01/05/15. Order written on rx paper. Pls send them a copy of his most recent INR result.-thx

## 2014-12-19 NOTE — Telephone Encounter (Signed)
[name] called wanting to know when should they recheck pts PT/INR. His last PT/INR was done at ER on 12/14/14 results: 2.84INR 29.3PT. Needs written order. She also would like a copy of PT/INR report faxed to [phone]. Please advise.Thanks.

## 2014-12-20 ENCOUNTER — Emergency Department (HOSPITAL_BASED_OUTPATIENT_CLINIC_OR_DEPARTMENT_OTHER)
Admission: EM | Admit: 2014-12-20 | Discharge: 2014-12-20 | Disposition: A | Discharge status: Home / Self Care | Payer: Medicare Other | Attending: Emergency Medicine | Admitting: Emergency Medicine

## 2014-12-20 ENCOUNTER — Encounter (HOSPITAL_BASED_OUTPATIENT_CLINIC_OR_DEPARTMENT_OTHER): Payer: Self-pay | Admitting: *Deleted

## 2014-12-20 DIAGNOSIS — Z86718 Personal history of other venous thrombosis and embolism: Secondary | ICD-10-CM | POA: Insufficient documentation

## 2014-12-20 DIAGNOSIS — Z8701 Personal history of pneumonia (recurrent): Secondary | ICD-10-CM | POA: Diagnosis not present

## 2014-12-20 DIAGNOSIS — Z85828 Personal history of other malignant neoplasm of skin: Secondary | ICD-10-CM | POA: Diagnosis not present

## 2014-12-20 DIAGNOSIS — Z8739 Personal history of other diseases of the musculoskeletal system and connective tissue: Secondary | ICD-10-CM | POA: Insufficient documentation

## 2014-12-20 DIAGNOSIS — E1142 Type 2 diabetes mellitus with diabetic polyneuropathy: Secondary | ICD-10-CM | POA: Diagnosis not present

## 2014-12-20 DIAGNOSIS — E785 Hyperlipidemia, unspecified: Secondary | ICD-10-CM | POA: Diagnosis not present

## 2014-12-20 DIAGNOSIS — I129 Hypertensive chronic kidney disease with stage 1 through stage 4 chronic kidney disease, or unspecified chronic kidney disease: Secondary | ICD-10-CM | POA: Diagnosis not present

## 2014-12-20 DIAGNOSIS — Z794 Long term (current) use of insulin: Secondary | ICD-10-CM | POA: Diagnosis not present

## 2014-12-20 DIAGNOSIS — F32A Depression, unspecified: Secondary | ICD-10-CM

## 2014-12-20 DIAGNOSIS — Z86711 Personal history of pulmonary embolism: Secondary | ICD-10-CM | POA: Insufficient documentation

## 2014-12-20 DIAGNOSIS — Z8673 Personal history of transient ischemic attack (TIA), and cerebral infarction without residual deficits: Secondary | ICD-10-CM | POA: Insufficient documentation

## 2014-12-20 DIAGNOSIS — N189 Chronic kidney disease, unspecified: Secondary | ICD-10-CM | POA: Diagnosis not present

## 2014-12-20 DIAGNOSIS — I2510 Atherosclerotic heart disease of native coronary artery without angina pectoris: Secondary | ICD-10-CM | POA: Diagnosis not present

## 2014-12-20 DIAGNOSIS — Z87442 Personal history of urinary calculi: Secondary | ICD-10-CM | POA: Insufficient documentation

## 2014-12-20 DIAGNOSIS — Z008 Encounter for other general examination: Secondary | ICD-10-CM | POA: Diagnosis present

## 2014-12-20 DIAGNOSIS — F329 Major depressive disorder, single episode, unspecified: Secondary | ICD-10-CM | POA: Insufficient documentation

## 2014-12-20 DIAGNOSIS — Z7901 Long term (current) use of anticoagulants: Secondary | ICD-10-CM | POA: Diagnosis not present

## 2014-12-20 DIAGNOSIS — Z8669 Personal history of other diseases of the nervous system and sense organs: Secondary | ICD-10-CM | POA: Diagnosis not present

## 2014-12-20 DIAGNOSIS — E11319 Type 2 diabetes mellitus with unspecified diabetic retinopathy without macular edema: Secondary | ICD-10-CM | POA: Insufficient documentation

## 2014-12-20 DIAGNOSIS — Z79899 Other long term (current) drug therapy: Secondary | ICD-10-CM | POA: Diagnosis not present

## 2014-12-20 LAB — URINALYSIS, ROUTINE W REFLEX MICROSCOPIC
BILIRUBIN URINE: NEGATIVE
Glucose, UA: NEGATIVE mg/dL
HGB URINE DIPSTICK: NEGATIVE
Ketones, ur: NEGATIVE mg/dL
Nitrite: NEGATIVE
Protein, ur: 30 mg/dL — AB
Specific Gravity, Urine: 1.021 (ref 1.005–1.030)
UROBILINOGEN UA: 1 mg/dL (ref 0.0–1.0)
pH: 5.5 (ref 5.0–8.0)

## 2014-12-20 LAB — COMPREHENSIVE METABOLIC PANEL
ALBUMIN: 3.3 g/dL — AB (ref 3.5–5.0)
ALT: 18 U/L (ref 17–63)
AST: 19 U/L (ref 15–41)
Alkaline Phosphatase: 67 U/L (ref 38–126)
Anion gap: 10 (ref 5–15)
BUN: 22 mg/dL — ABNORMAL HIGH (ref 6–20)
CHLORIDE: 104 mmol/L (ref 101–111)
CO2: 25 mmol/L (ref 22–32)
CREATININE: 1.32 mg/dL — AB (ref 0.61–1.24)
Calcium: 8.8 mg/dL — ABNORMAL LOW (ref 8.9–10.3)
GFR calc Af Amer: 57 mL/min — ABNORMAL LOW (ref >60)
GFR calc non Af Amer: 49 mL/min — ABNORMAL LOW (ref >60)
Glucose, Bld: 127 mg/dL — ABNORMAL HIGH (ref 65–99)
Potassium: 3.8 mmol/L (ref 3.5–5.1)
Sodium: 139 mmol/L (ref 135–145)
TOTAL PROTEIN: 6.5 g/dL (ref 6.5–8.1)
Total Bilirubin: 0.7 mg/dL (ref 0.3–1.2)

## 2014-12-20 LAB — URINE MICROSCOPIC-ADD ON

## 2014-12-20 LAB — CBC WITH DIFFERENTIAL/PLATELET
Basophils Absolute: 0 10*3/uL (ref 0.0–0.1)
Basophils Relative: 0 % (ref 0–1)
EOS ABS: 0.2 10*3/uL (ref 0.0–0.7)
Eosinophils Relative: 3 % (ref 0–5)
HEMATOCRIT: 37.4 % — AB (ref 39.0–52.0)
HEMOGLOBIN: 12.7 g/dL — AB (ref 13.0–17.0)
LYMPHS ABS: 1.9 10*3/uL (ref 0.7–4.0)
LYMPHS PCT: 31 % (ref 12–46)
MCH: 31.1 pg (ref 26.0–34.0)
MCHC: 34 g/dL (ref 30.0–36.0)
MCV: 91.7 fL (ref 78.0–100.0)
MONOS PCT: 10 % (ref 3–12)
Monocytes Absolute: 0.6 10*3/uL (ref 0.1–1.0)
NEUTROS PCT: 56 % (ref 43–77)
Neutro Abs: 3.5 10*3/uL (ref 1.7–7.7)
PLATELETS: 144 10*3/uL — AB (ref 150–400)
RBC: 4.08 MIL/uL — ABNORMAL LOW (ref 4.22–5.81)
RDW: 14.1 % (ref 11.5–15.5)
WBC: 6.1 10*3/uL (ref 4.0–10.5)

## 2014-12-20 LAB — PROTIME-INR
INR: 2.22 — ABNORMAL HIGH (ref 0.00–1.49)
Prothrombin Time: 24.4 seconds — ABNORMAL HIGH (ref 11.6–15.2)

## 2014-12-20 LAB — RAPID URINE DRUG SCREEN, HOSP PERFORMED
AMPHETAMINES: NOT DETECTED
BENZODIAZEPINES: NOT DETECTED
Barbiturates: NOT DETECTED
Cocaine: NOT DETECTED
Opiates: NOT DETECTED
Tetrahydrocannabinol: NOT DETECTED

## 2014-12-20 LAB — ETHANOL: Alcohol, Ethyl (B): <5 mg/dL (ref <5)

## 2014-12-20 NOTE — ED Notes (Signed)
FAST ASSESSMENT: negative

## 2014-12-20 NOTE — ED Notes (Signed)
Pt waiting for Tele-Psych conference call consult

## 2014-12-20 NOTE — ED Notes (Signed)
Pt requesting PO fluids, provided per EDP verbal orders

## 2014-12-20 NOTE — ED Notes (Signed)
MD at bedside. 

## 2014-12-20 NOTE — ED Notes (Signed)
Pt notified of need of urine sample, states will notify this RN when able to void

## 2014-12-20 NOTE — ED Provider Notes (Signed)
CSN: [id number]     Arrival date & time 12/20/14  1653 History  This chart was scribed for Malvin Johns, MD by Terressa Koyanagi, ED Scribe. This patient was seen in room MH02/MH02 and the patient's care was started at 5:44 PM.   Chief Complaint  Patient presents with  . Medical Clearance   The history is provided by the patient. No language interpreter was used.   PCP: Tammi Sou, MD HPI Comments: Wesley [name] is a 81 y.o. male, who is a resident at [facility], with PMHx noted below, who presents to the Emergency Department, accompanied by his nephew and friend, for a psych evaluation. Pt's nephew reports he was contacted by Nanine Means today and informed that he must bring pt to the ED for medical clearance confirming that pt is not a danger to himself or to others because pt stated to [facility] staff that he was going to jump out of the window. Pt states he cannot recall making any such statement. Pt notes that while he had multiple episodes of depression throughout his life and has been depressed for the past week, he denies any SI, suicide attempt or any plans to harm himself. Pt's nephew states that pt has been depressed which has been for a long time.  Has increasing confusion over last year, but no recent change. Both pt's friend and nephew state that he is acting at baseline today. Pt denies any other complaints at this time.   Past Medical History  Diagnosis Date  . Diabetes mellitus Onset approx 1980  . Hyperlipidemia   . Carotid artery occlusion   . Calf DVT (deep venous thrombosis) 2010    a. hx of right calf - chronic coumadin since 12/2008.  Recurrence shortly after d/c of coumadin 03/2013--coumadin restarted.  Marland Kitchen PVD (peripheral vascular disease)     ?Tibial dz? Re-eval 01/29/13 by Vascular MD concluded that no significant evidence of PAD was present.  . Coronary artery disease     a. s/p MI & CABG in 2000;  b. 04/2012 Cath/PTCA:  VG-> RI patent w/ 99 in native RI  (PTCA only );  c.  LHC 09/20/12:  oLM 30%, mid LAD occluded, prox D1 80%, mid CFX 100, OM1 30, prox RCA 100, S-RI, native RI 80% (stable), S-OM patent, native OM2 100 (filled via R collats) - culprit, L-LAD patent, S-RCA mid 50%. - med rx; e. 11/2012 NSTEMI->med rx.  Marland Kitchen Hx pulmonary embolism 2010    a. bilateral;  b. Chronic coumadin since 12/2008  . Stroke 2010    [facility].  . Ischemic cardiomyopathy     a. 04/2012 Echo: EF 40-45%.;  b.  Echo 5/14: EF 60-65%, NL WM, Gr 1 DD  . Hypertension   . Osteoarthritis     Low back and left knee and both hips  . CKD (chronic kidney disease)     left sided chronic high grade obstruction on NM renal imaging (09/2011)--9% function compared to 90% function on right side.  . Nephrolithiasis     with left ureterolithiasis; hx of ureteral stent on left--has resulted in markedly decreased function of left kidney.  . Diabetic retinopathy associated with type 2 diabetes mellitus   . Squamous cell carcinoma 2008    on his back  . Insomnia   . History of pneumonia   . Diabetic peripheral neuropathy     was on neurontin at one time   Past Surgical History  Procedure Laterality Date  .  Coronary artery bypass graft  2000    4 vessel cabg in [place], [place].  . Cardiac catheterization  04/2012    patent grafts, severe three-vessel CAD, severe diffuse small vessel disease and residual CAD. He underwent angioplasty alone to a 99% ramus intermedius lesion with resultant dissection and 70% restenosis.  . Carotid endarterectomy  2000    bilateral  . Lithotripsy      (Dr. Jeffie Pollock)  . Cardiac catheterization  09/2012    Occlusion of the native OM 2 with right-to-left collateral filling, prior balloon angioplasty site unchanged, SVG-RCA was unchanged, mildly elevated LVEDP  . Colonoscopy  2004    Dr. Ysidro Evert in [place], [place].  . Transthoracic echocardiogram  01/2014    Mild LVH, normal LV systolic fxn ([facility])  . Carot dopp  01/2014     1-39% sten of ICA's bilat ([facility])  . Left heart catheterization with coronary/graft angiogram N/A 05/01/2012    Procedure: LEFT HEART CATHETERIZATION WITH Beatrix Fetters;  Surgeon: Sherren Mocha, MD;  Location[facility] CATH LAB;  Service: Cardiovascular;  Laterality: N/A;  Possible angioplasty  . Left heart catheterization with coronary/graft angiogram N/A 09/20/2012    Procedure: LEFT HEART CATHETERIZATION WITH Beatrix Fetters;  Surgeon: Wellington Hampshire, MD;  Location: [facility] CATH LAB;  Service: Cardiovascular;  Laterality: N/A;   Family History  Problem Relation Age of Onset  . Stroke Mother     age 28  . Other Father     pulmonary embolus age 39  . Diabetes Sister   . Stroke Sister   . Diabetes Brother     brother was murdered  . Diabetes Sister    History  Substance Use Topics  . Smoking status: Never Smoker   . Smokeless tobacco: Never Used  . Alcohol Use: No    Review of Systems  Constitutional: Negative for fever, chills, diaphoresis and fatigue.  HENT: Negative for congestion, rhinorrhea and sneezing.   Eyes: Negative.   Respiratory: Negative for cough, chest tightness and shortness of breath.   Cardiovascular: Negative for chest pain and leg swelling.  Gastrointestinal: Negative for nausea, vomiting, abdominal pain, diarrhea and blood in stool.  Genitourinary: Negative for frequency, hematuria, flank pain and difficulty urinating.  Musculoskeletal: Negative for back pain and neck pain.  Skin: Negative for rash and wound.  Neurological: Negative for dizziness, speech difficulty, weakness, numbness and headaches.  Psychiatric/Behavioral: Positive for confusion. Negative for suicidal ideas and self-injury.   Allergies  Doxycycline; Morphine and related; Niacin and related; and Zoloft  Home Medications   Prior to Admission medications   Medication Sig Start Date End Date Taking? Authorizing Provider  atorvastatin (LIPITOR) 80 MG tablet  TAKE 1 TABLET BY MOUTH EVERY DAY 09/30/14  Yes Darlin Coco, MD  carvedilol (COREG) 6.25 MG tablet TAKE 1 TABLET (6.25 MG TOTAL) BY MOUTH 2 (TWO) TIMES DAILY WITH A MEAL. Patient taking differently: TAKE 1 TABLET (6.25 MG TOTAL) BY MOUTH DAILY WITH A MEAL. 06/25/14  Yes Darlin Coco, MD  diphenhydramine-acetaminophen (TYLENOL PM) 25-500 MG TABS Take 2 tablets by mouth at bedtime.   Yes Historical Provider, MD  docusate sodium (COLACE) 100 MG capsule Take 200 mg by mouth daily.   Yes Historical Provider, MD  hydrALAZINE (APRESOLINE) 25 MG tablet TAKE 1 TABLET (25 MG TOTAL) BY MOUTH 2 (TWO) TIMES DAILY. Patient taking differently: TAKE 1 TABLET (25 MG TOTAL) BY MOUTH DAILY. 06/25/14  Yes Darlin Coco, MD  insulin glargine (LANTUS) 100 UNIT/ML injection Inject  0.2 mLs (20 Units total) into the skin every morning. 12/04/14  Yes Irene Pap, NP  isosorbide mononitrate (IMDUR) 60 MG 24 hr tablet Take 1 tablet (60 mg total) by mouth daily. 12/04/14  Yes Irene Pap, NP  nitroGLYCERIN (NITROSTAT) 0.4 MG SL tablet Place 0.4 mg under the tongue every 5 (five) minutes x 3 doses as needed for chest pain. 05/07/12  Yes Rogelia Mire, NP  polyethylene glycol (MIRALAX / GLYCOLAX) packet Take 17 g by mouth daily. 10/01/14  Yes Debby Freiberg, MD  sertraline (ZOLOFT) 25 MG tablet Take 1 tablet (25 mg total) by mouth daily. 12/04/14  Yes Irene Pap, NP  warfarin (COUMADIN) 5 MG tablet TAKE 1 TABLET (5 MG TOTAL) BY MOUTH EVERY EVENING. AS DIRECTED BY DR. Modena Morrow Patient taking differently: Take 2.5-5 mg by mouth See admin instructions. Pt takes 5 mg Tues, Wed, Thurs, Sat, & Sun. 2.5 mg Mon and Fri 06/24/14  Yes Tammi Sou, MD   Triage Vitals: BP 189/76 mmHg  Pulse 68  Temp(Src) 97.5 F (36.4 C) (Oral)  Resp 16  Ht 6\' 2"  (1.88 m)  Wt 190 lb (86.183 kg)  BMI 24.38 kg/m2  SpO2 100% Physical Exam  Constitutional: He appears well-developed and well-nourished.  HENT:  Head: Normocephalic and  atraumatic.  Eyes: Pupils are equal, round, and reactive to light.  Neck: Normal range of motion. Neck supple.  Cardiovascular: Normal rate, regular rhythm and normal heart sounds.   Pulmonary/Chest: Effort normal and breath sounds normal. No respiratory distress. He has no wheezes. He has no rales. He exhibits no tenderness.  Abdominal: Soft. Bowel sounds are normal. There is no tenderness. There is no rebound and no guarding.  Musculoskeletal: Normal range of motion. He exhibits no edema.  Lymphadenopathy:    He has no cervical adenopathy.  Neurological: He is alert. He has normal strength.  Finger to nose intact. No pronator drift. A&O to place and people only. Does not know year or date.   Skin: Skin is warm and dry. No rash noted.  Psychiatric: He has a normal mood and affect.    ED Course  Procedures (including critical care time) DIAGNOSTIC STUDIES: Oxygen Saturation is 100% on RA, nl by my interpretation.    COORDINATION OF CARE: 5:52 PM-Discussed treatment plan which includes psych consult with pt at bedside and pt agreed to plan.   Results for orders placed or performed during the hospital encounter of 12/20/14  CBC WITH DIFFERENTIAL  Result Value Ref Range   WBC 6.1 4.0 - 10.5 K/uL   RBC 4.08 (L) 4.22 - 5.81 MIL/uL   Hemoglobin 12.7 (L) 13.0 - 17.0 g/dL   HCT 37.4 (L) 39.0 - 52.0 %   MCV 91.7 78.0 - 100.0 fL   MCH 31.1 26.0 - 34.0 pg   MCHC 34.0 30.0 - 36.0 g/dL   RDW 14.1 11.5 - 15.5 %   Platelets 144 (L) 150 - 400 K/uL   Neutrophils Relative % 56 43 - 77 %   Neutro Abs 3.5 1.7 - 7.7 K/uL   Lymphocytes Relative 31 12 - 46 %   Lymphs Abs 1.9 0.7 - 4.0 K/uL   Monocytes Relative 10 3 - 12 %   Monocytes Absolute 0.6 0.1 - 1.0 K/uL   Eosinophils Relative 3 0 - 5 %   Eosinophils Absolute 0.2 0.0 - 0.7 K/uL   Basophils Relative 0 0 - 1 %   Basophils Absolute 0.0 0.0 - 0.1 K/uL  Comprehensive metabolic panel  Result Value Ref Range   Sodium 139 135 - 145 mmol/L    Potassium 3.8 3.5 - 5.1 mmol/L   Chloride 104 101 - 111 mmol/L   CO2 25 22 - 32 mmol/L   Glucose, Bld 127 (H) 65 - 99 mg/dL   BUN 22 (H) 6 - 20 mg/dL   Creatinine, Ser 1.32 (H) 0.61 - 1.24 mg/dL   Calcium 8.8 (L) 8.9 - 10.3 mg/dL   Total Protein 6.5 6.5 - 8.1 g/dL   Albumin 3.3 (L) 3.5 - 5.0 g/dL   AST 19 15 - 41 U/L   ALT 18 17 - 63 U/L   Alkaline Phosphatase 67 38 - 126 U/L   Total Bilirubin 0.7 0.3 - 1.2 mg/dL   GFR calc non Af Amer 49 (L) >60 mL/min   GFR calc Af Amer 57 (L) >60 mL/min   Anion gap 10 5 - 15  Urine rapid drug screen (hosp performed)not at [facility]  Result Value Ref Range   Opiates NONE DETECTED NONE DETECTED   Cocaine NONE DETECTED NONE DETECTED   Benzodiazepines NONE DETECTED NONE DETECTED   Amphetamines NONE DETECTED NONE DETECTED   Tetrahydrocannabinol NONE DETECTED NONE DETECTED   Barbiturates NONE DETECTED NONE DETECTED  Ethanol  Result Value Ref Range   Alcohol, Ethyl (B) <5 <5 mg/dL  Urinalysis, Routine w reflex microscopic (not at [facility])  Result Value Ref Range   Color, Urine YELLOW YELLOW   APPearance CLEAR CLEAR   Specific Gravity, Urine 1.021 1.005 - 1.030   pH 5.5 5.0 - 8.0   Glucose, UA NEGATIVE NEGATIVE mg/dL   Hgb urine dipstick NEGATIVE NEGATIVE   Bilirubin Urine NEGATIVE NEGATIVE   Ketones, ur NEGATIVE NEGATIVE mg/dL   Protein, ur 30 (A) NEGATIVE mg/dL   Urobilinogen, UA 1.0 0.0 - 1.0 mg/dL   Nitrite NEGATIVE NEGATIVE   Leukocytes, UA TRACE (A) NEGATIVE  Protime-INR  Result Value Ref Range   Prothrombin Time 24.4 (H) 11.6 - 15.2 seconds   INR 2.22 (H) 0.00 - 1.49  Urine microscopic-add on  Result Value Ref Range   Squamous Epithelial / LPF RARE RARE   WBC, UA 3-6 <3 WBC/hpf   RBC / HPF 3-6 <3 RBC/hpf   Bacteria, UA MANY (A) RARE   Urine-Other MUCOUS PRESENT    Dg Chest 2 View  12/14/2014   CLINICAL DATA:  Chest pain.  EXAM: CHEST  2 VIEW  COMPARISON:  Oct 21, 2014.  FINDINGS: The heart size and mediastinal contours are within  normal limits. No pneumothorax or pleural effusion is noted. Sternotomy wires are noted. Right lung is clear. Minimal left basilar subsegmental atelectasis is noted. The visualized skeletal structures are unremarkable.  IMPRESSION: Minimal left basilar subsegmental atelectasis.   Electronically Signed   By: Marijo Conception, M.D.   On: 12/14/2014 15:02      MDM   Final diagnoses:  Depression    Patient presents with complaint that he's been making threats of suicide at the nursing facility. Talking to his nephew, he has been making these statements but they seem to be offhanded remarks. He is currently denying any suicidal ideations. His daughter was contacted by the behavioral health counselor. She states that patient has been making these remarks 4 years and has never attempted suicide. Per her, patient does have a long-standing history of depression. He's currently not on antidepressants as he did have a bad reaction to Zoloft in the past. The patient has been  seen by behavioral health he does not feel that patient meets criteria for inpatient hospitalization. They did make the recommendation that his doctor at the nursing facility may want to consider starting him on antidepressant. At this point they did not feel the patient was a danger to himself or others. Patient may discharged back to the nursing facility.  I personally performed the services described in this documentation, which was scribed in my presence.  The recorded information has been reviewed and considered.    Malvin Johns, MD 12/20/14 2043

## 2014-12-20 NOTE — ED Notes (Signed)
Spoke to Inez [place], [employer], [employer] - Narcissa states "Last week, Makhi told his nephew he was going to kill himself by leaving facility and jumping into a river. His nephew became concerned and notified me. We notified Delynn's doctor and placed a WanderGuard on him. The wanderguard is a charge and his family does not want to pay for it, so in order to remove it we have to have the doctors certify that he is not a threat to himself. Mohanad has not mentioned any further thoughts of wanting to harm hisself within the past week."

## 2014-12-20 NOTE — BH Assessment (Addendum)
Tele Assessment Note   Wesley [name] is an 81 y.o. male.  -Clinician talked to Dr. Tamera Punt regarding need for TTS.  Patient is a resident at [facility].  Staff there have reported that they have heard him say he was depressed and wanted to jump into a river or jump from a window.  Clinician talked to patient about whether he has had any thoughts of suicide.  He said no that he has not.  He said that he has never had those kind of thoughts.  No previous attempt to kill self.  Patient denies having said anything about wanting to jump into a river.  He smiled and said "there's not even a river around that place" referring to [place].  Patient denies any current or recurrent HI or A/V hallucinations.  Patient said that he has a lot of friends that can attest that he would never try to kill himself.  He has no previous outpatient or inpatient psychiatric history per patient.  Patient says that he has been at [facility] for the last few weeks.  He says that some of the people in the dining room gossip and that he thinks that they have made up these accusations.    When asked if he is depressed he says "Yes I get sad but I've been widowed twice and divorced a wife too but that worked out for the best."  He denies any current depressive symptoms.  Patient talked to one of his daughters, Layth Cerezo.  She said that patient has been depressed for a long time and has made statements like what [place] describe before.  He has never attempted to do anything though she reports.  She says that patient has been crying a lot lately and is more confused.  There has been a family issue that has been getting him down too.  She said that he had been prescribed Zoloft before but he had an allergic reaction to it.  She said that she thinks he needs something to make him less agitated.  -Patient care discussed with Julio Sicks, PA who recommends that the physician at [facility] may want to consider a  anti-depressant that he will tolerate.  Otherwise Juanda Crumble sees no need for inpatient psychiatric care at this time.  Clinician talked with Dr. Tamera Punt at [facility] and she agreed that patient should be returned to [facility].   Axis I: See current hospital problem list Axis II: Deferred Axis III:  Past Medical History  Diagnosis Date  . Diabetes mellitus Onset approx 1980  . Hyperlipidemia   . Carotid artery occlusion   . Calf DVT (deep venous thrombosis) 2010    a. hx of right calf - chronic coumadin since 12/2008.  Recurrence shortly after d/c of coumadin 03/2013--coumadin restarted.  Marland Kitchen PVD (peripheral vascular disease)     ?Tibial dz? Re-eval 01/29/13 by Vascular MD concluded that no significant evidence of PAD was present.  . Coronary artery disease     a. s/p MI & CABG in 2000;  b. 04/2012 Cath/PTCA:  VG-> RI patent w/ 99 in native RI (PTCA only );  c.  LHC 09/20/12:  oLM 30%, mid LAD occluded, prox D1 80%, mid CFX 100, OM1 30, prox RCA 100, S-RI, native RI 80% (stable), S-OM patent, native OM2 100 (filled via R collats) - culprit, L-LAD patent, S-RCA mid 50%. - med rx; e. 11/2012 NSTEMI->med rx.  Marland Kitchen Hx pulmonary embolism 2010    a. bilateral;  b. Chronic coumadin since 12/2008  . Stroke 2010    [facility].  . Ischemic cardiomyopathy     a. 04/2012 Echo: EF 40-45%.;  b.  Echo 5/14: EF 60-65%, NL WM, Gr 1 DD  . Hypertension   . Osteoarthritis     Low back and left knee and both hips  . CKD (chronic kidney disease)     left sided chronic high grade obstruction on NM renal imaging (09/2011)--9% function compared to 90% function on right side.  . Nephrolithiasis     with left ureterolithiasis; hx of ureteral stent on left--has resulted in markedly decreased function of left kidney.  . Diabetic retinopathy associated with type 2 diabetes mellitus   . Squamous cell carcinoma 2008    on his back  . Insomnia   . History of pneumonia   . Diabetic peripheral  neuropathy     was on neurontin at one time   Axis IV: housing problems and occupational problems Axis V: 51-60 moderate symptoms  Past Medical History:  Past Medical History  Diagnosis Date  . Diabetes mellitus Onset approx 1980  . Hyperlipidemia   . Carotid artery occlusion   . Calf DVT (deep venous thrombosis) 2010    a. hx of right calf - chronic coumadin since 12/2008.  Recurrence shortly after d/c of coumadin 03/2013--coumadin restarted.  Marland Kitchen PVD (peripheral vascular disease)     ?Tibial dz? Re-eval 01/29/13 by Vascular MD concluded that no significant evidence of PAD was present.  . Coronary artery disease     a. s/p MI & CABG in 2000;  b. 04/2012 Cath/PTCA:  VG-> RI patent w/ 99 in native RI (PTCA only );  c.  LHC 09/20/12:  oLM 30%, mid LAD occluded, prox D1 80%, mid CFX 100, OM1 30, prox RCA 100, S-RI, native RI 80% (stable), S-OM patent, native OM2 100 (filled via R collats) - culprit, L-LAD patent, S-RCA mid 50%. - med rx; e. 11/2012 NSTEMI->med rx.  Marland Kitchen Hx pulmonary embolism 2010    a. bilateral;  b. Chronic coumadin since 12/2008  . Stroke 2010    [facility].  . Ischemic cardiomyopathy     a. 04/2012 Echo: EF 40-45%.;  b.  Echo 5/14: EF 60-65%, NL WM, Gr 1 DD  . Hypertension   . Osteoarthritis     Low back and left knee and both hips  . CKD (chronic kidney disease)     left sided chronic high grade obstruction on NM renal imaging (09/2011)--9% function compared to 90% function on right side.  . Nephrolithiasis     with left ureterolithiasis; hx of ureteral stent on left--has resulted in markedly decreased function of left kidney.  . Diabetic retinopathy associated with type 2 diabetes mellitus   . Squamous cell carcinoma 2008    on his back  . Insomnia   . History of pneumonia   . Diabetic peripheral neuropathy     was on neurontin at one time    Past Surgical History  Procedure Laterality Date  . Coronary artery bypass graft  2000    4 vessel cabg  in [place], [place].  . Cardiac catheterization  04/2012    patent grafts, severe three-vessel CAD, severe diffuse small vessel disease and residual CAD. He underwent angioplasty alone to a 99% ramus intermedius lesion with resultant dissection and 70% restenosis.  . Carotid endarterectomy  2000    bilateral  . Lithotripsy      (Dr. Jeffie Pollock)  .  Cardiac catheterization  09/2012    Occlusion of the native OM 2 with right-to-left collateral filling, prior balloon angioplasty site unchanged, SVG-RCA was unchanged, mildly elevated LVEDP  . Colonoscopy  2004    Dr. Ysidro Evert in [place], [place].  . Transthoracic echocardiogram  01/2014    Mild LVH, normal LV systolic fxn ([facility])  . Carot dopp  01/2014    1-39% sten of ICA's bilat ([facility])  . Left heart catheterization with coronary/graft angiogram N/A 05/01/2012    Procedure: LEFT HEART CATHETERIZATION WITH Beatrix Fetters;  Surgeon: Sherren Mocha, MD;  Location: [facility] CATH LAB;  Service: Cardiovascular;  Laterality: N/A;  Possible angioplasty  . Left heart catheterization with coronary/graft angiogram N/A 09/20/2012    Procedure: LEFT HEART CATHETERIZATION WITH Beatrix Fetters;  Surgeon: Wellington Hampshire, MD;  Location: [facility] CATH LAB;  Service: Cardiovascular;  Laterality: N/A;    Family History:  Family History  Problem Relation Age of Onset  . Stroke Mother     age 78  . Other Father     pulmonary embolus age 14  . Diabetes Sister   . Stroke Sister   . Diabetes Brother     brother was murdered  . Diabetes Sister     Social History:  reports that he has never smoked. He has never used smokeless tobacco. He reports that he does not drink alcohol or use illicit drugs.  Additional Social History:  Alcohol / Drug Use Pain Medications: See PTA medication list Prescriptions: See PTA medication list Over the Counter: Tylenol PM History of alcohol / drug use?: No history of alcohol / drug abuse  CIWA:  CIWA-Ar BP: 182/71 mmHg Pulse Rate: 63 COWS:    PATIENT STRENGTHS: (choose at least two) Average or above average intelligence Communication skills Supportive family/friends  Allergies:  Allergies  Allergen Reactions  . Doxycycline Swelling    swelling  . Morphine And Related Nausea Only    NAUSEA    . Niacin And Related Other (See Comments)    Hot flashes  . Zoloft [Sertraline Hcl]     Listed on [facility]    Home Medications:  (Not in a hospital admission)  OB/GYN Status:  No LMP for male patient.  General Assessment Data Location of Assessment: [facility] (Pt is at [facility] TTS Assessment: In system Is this a Tele or Face-to-Face Assessment?: Tele Assessment Is this an Initial Assessment or a Re-assessment for this encounter?: Initial Assessment Marital status: Widowed Is patient pregnant?: No Pregnancy Status: No Living Arrangements: Other (Comment) (Pt is a resident of [place]) Can pt return to current living arrangement?: Yes Admission Status: Voluntary Is patient capable of signing voluntary admission?: Yes Referral Source: Other Passenger transport manager Assited Living) Insurance type: [facility]     Crisis Care Plan Living Arrangements: Other (Comment) (Pt is a resident of [place]) Name of Psychiatrist: None Name of Therapist: None  Education Status Is patient currently in school?: No Highest grade of school patient has completed: College graduate  Risk to self with the past 6 months Suicidal Ideation: No Has patient been a risk to self within the past 6 months prior to admission? : No Suicidal Intent: No Has patient had any suicidal intent within the past 6 months prior to admission? : No (Pt denies making any type of suicidal statement.) Is patient at risk for suicide?: No Suicidal Plan?: No Has patient had any suicidal plan within the past 6 months prior  to admission? : No Access to Means: No What has been  your use of drugs/alcohol within the last 12 months?: Denies use Previous Attempts/Gestures: No How many times?: 0 Other Self Harm Risks: "Not at all." Triggers for Past Attempts: None known Intentional Self Injurious Behavior: None Family Suicide History: No Recent stressful life event(s): Other (Comment) (Denies any recent stressors.) Persecutory voices/beliefs?: No Depression: No Depression Symptoms:  (Denies any recent depressive symptoms.) Substance abuse history and/or treatment for substance abuse?: No Suicide prevention information given to non-admitted patients: Not applicable  Risk to Others within the past 6 months Homicidal Ideation: No Does patient have any lifetime risk of violence toward others beyond the six months prior to admission? : No Thoughts of Harm to Others: No Current Homicidal Intent: No Current Homicidal Plan: No Access to Homicidal Means: No Identified Victim: No one History of harm to others?: No Assessment of Violence: None Noted Violent Behavior Description: Pt denies ever having been in a fight Does patient have access to weapons?: No Criminal Charges Pending?: No Does patient have a court date: No Is patient on probation?: No  Psychosis Hallucinations: None noted Delusions: None noted  Mental Status Report Appearance/Hygiene: Unremarkable Eye Contact: Good Motor Activity: Freedom of movement, Unsteady Speech: Logical/coherent Level of Consciousness: Alert Mood: Anxious Affect: Other (Comment) (Pleasant) Anxiety Level: None Thought Processes: Coherent, Relevant Judgement: Unimpaired Orientation: Person, Place, Situation Obsessive Compulsive Thoughts/Behaviors: None  Cognitive Functioning Concentration: Decreased Memory: Remote Impaired, Recent Impaired IQ: Average Insight: Fair Impulse Control: Good Appetite: Good Weight Loss: 0 Weight Gain: 0 Sleep: No Change Total Hours of Sleep: 9 Vegetative Symptoms: None  ADLScreening  [facility] Assessment Services) Patient's cognitive ability adequate to safely complete daily activities?: Yes Patient able to express need for assistance with ADLs?: Yes Independently performs ADLs?: Yes (appropriate for developmental age) (May need help with dressing.)  Prior Inpatient Therapy Prior Inpatient Therapy: No Prior Therapy Dates: None Prior Therapy Facilty/Provider(s): None Reason for Treatment: None  Prior Outpatient Therapy Prior Outpatient Therapy: No Prior Therapy Dates: None Prior Therapy [facility]ilty/Provider(s): [facility] Reason for Treatment: None Does patient have an ACCT team?: No Does patient have Intensive In-House Services?  : No Does patient have [facility] services? : No Does patient have P4CC services?: No  ADL Screening (condition at time of admission) Patient's cognitive ability adequate to safely complete daily activities?: Yes Is the patient deaf or have difficulty hearing?: No Does the patient have difficulty seeing, even when wearing glasses/contacts?: No Does the patient have difficulty concentrating, remembering, or making decisions?: Yes Patient able to express need for assistance with ADLs?: Yes Does the patient have difficulty dressing or bathing?: Yes Independently performs ADLs?: Yes (appropriate for developmental age) (May need help with dressing.) Does the patient have difficulty walking or climbing stairs?: Yes Weakness of Legs: Both (Uses a walker to get around.) Weakness of Arms/Hands: Both  Home Assistive Devices/Equipment Home Assistive Devices/Equipment: Environmental consultant (specify type)    Abuse/Neglect Assessment (Assessment to be complete while patient is alone) Physical Abuse: Denies Verbal Abuse: Denies Sexual Abuse: Denies Exploitation of patient/patient's resources: Denies Self-Neglect: Denies     Regulatory affairs officer (For Healthcare) Does patient have an advance directive?: Yes Would patient like information on creating an advanced directive?:  No - patient declined information Type of Advance Directive: Healthcare Power of Attorney Does patient want to make changes to advanced directive?: No - Patient declined Copy of advanced directive(s) in chart?: Yes    Additional Information 1:1 In Past 12 Months?:  No CIRT Risk: No Elopement Risk: No Does patient have medical clearance?: No     Disposition:  Disposition Initial Assessment Completed for this Encounter: Yes Disposition of Patient: Other dispositions Other disposition(s): Other (Comment) (Pt to be reviewed with NP)  Curlene Dolphin Ray 12/20/2014 8:05 PM

## 2014-12-20 NOTE — Discharge Instructions (Signed)

## 2014-12-20 NOTE — ED Notes (Signed)
Pt presents with his 2 nephews, pt is a resident at [facility], Wesley [name] reports that he was sent here for a psych evaluation to determine if he can stay at [facility] staff report hearing patient state that he wanted to jump into a lake or jump out of his window - pt currently denies suicidal ideations, Denies any thoughts of wanting to hurt others, denies any suicidal attempts. Pt calm and cooperative.

## 2014-12-22 DIAGNOSIS — L600 Ingrowing nail: Secondary | ICD-10-CM | POA: Diagnosis not present

## 2014-12-22 DIAGNOSIS — M2010 Hallux valgus (acquired), unspecified foot: Secondary | ICD-10-CM | POA: Diagnosis not present

## 2014-12-22 DIAGNOSIS — M79673 Pain in unspecified foot: Secondary | ICD-10-CM | POA: Diagnosis not present

## 2014-12-22 DIAGNOSIS — B351 Tinea unguium: Secondary | ICD-10-CM | POA: Diagnosis not present

## 2014-12-23 ENCOUNTER — Telehealth: Payer: Self-pay | Admitting: *Deleted

## 2014-12-23 DIAGNOSIS — I2510 Atherosclerotic heart disease of native coronary artery without angina pectoris: Secondary | ICD-10-CM | POA: Diagnosis not present

## 2014-12-23 DIAGNOSIS — M6281 Muscle weakness (generalized): Secondary | ICD-10-CM | POA: Diagnosis not present

## 2014-12-23 DIAGNOSIS — K5900 Constipation, unspecified: Secondary | ICD-10-CM | POA: Diagnosis not present

## 2014-12-23 DIAGNOSIS — E1159 Type 2 diabetes mellitus with other circulatory complications: Secondary | ICD-10-CM | POA: Diagnosis not present

## 2014-12-23 DIAGNOSIS — I10 Essential (primary) hypertension: Secondary | ICD-10-CM | POA: Diagnosis not present

## 2014-12-23 NOTE — Telephone Encounter (Signed)
Noted  

## 2014-12-23 NOTE — Telephone Encounter (Signed)
[name] with PT [facility] stating that they did pts PT evaluation but pt has refused any further visits.

## 2014-12-24 DIAGNOSIS — I252 Old myocardial infarction: Secondary | ICD-10-CM | POA: Diagnosis not present

## 2014-12-24 DIAGNOSIS — R079 Chest pain, unspecified: Secondary | ICD-10-CM | POA: Diagnosis not present

## 2014-12-25 DIAGNOSIS — E039 Hypothyroidism, unspecified: Secondary | ICD-10-CM | POA: Diagnosis not present

## 2014-12-25 DIAGNOSIS — E559 Vitamin D deficiency, unspecified: Secondary | ICD-10-CM | POA: Diagnosis not present

## 2014-12-25 DIAGNOSIS — D519 Vitamin B12 deficiency anemia, unspecified: Secondary | ICD-10-CM | POA: Diagnosis not present

## 2014-12-25 DIAGNOSIS — D649 Anemia, unspecified: Secondary | ICD-10-CM | POA: Diagnosis not present

## 2014-12-25 DIAGNOSIS — E1159 Type 2 diabetes mellitus with other circulatory complications: Secondary | ICD-10-CM | POA: Diagnosis not present

## 2014-12-25 DIAGNOSIS — I10 Essential (primary) hypertension: Secondary | ICD-10-CM | POA: Diagnosis not present

## 2015-01-01 DIAGNOSIS — I10 Essential (primary) hypertension: Secondary | ICD-10-CM | POA: Diagnosis not present

## 2015-01-22 DIAGNOSIS — Z7901 Long term (current) use of anticoagulants: Secondary | ICD-10-CM | POA: Diagnosis not present

## 2015-02-02 ENCOUNTER — Other Ambulatory Visit: Payer: Self-pay

## 2015-02-02 DIAGNOSIS — R413 Other amnesia: Secondary | ICD-10-CM | POA: Diagnosis not present

## 2015-02-02 DIAGNOSIS — F0151 Vascular dementia with behavioral disturbance: Secondary | ICD-10-CM | POA: Diagnosis not present

## 2015-02-02 DIAGNOSIS — Z7901 Long term (current) use of anticoagulants: Secondary | ICD-10-CM | POA: Diagnosis not present

## 2015-02-02 DIAGNOSIS — I10 Essential (primary) hypertension: Secondary | ICD-10-CM

## 2015-02-02 DIAGNOSIS — F4321 Adjustment disorder with depressed mood: Secondary | ICD-10-CM

## 2015-02-02 MED ORDER — ISOSORBIDE MONONITRATE ER 60 MG PO TB24: 60.0000 mg | ORAL_TABLET | Freq: Every day | ORAL | Status: AC

## 2015-02-02 MED ORDER — SERTRALINE HCL 25 MG PO TABS: 25.0000 mg | ORAL_TABLET | Freq: Every day | ORAL | Status: DC

## 2015-02-03 DIAGNOSIS — F0150 Vascular dementia without behavioral disturbance: Secondary | ICD-10-CM | POA: Diagnosis not present

## 2015-02-03 DIAGNOSIS — E559 Vitamin D deficiency, unspecified: Secondary | ICD-10-CM | POA: Diagnosis not present

## 2015-02-11 DIAGNOSIS — I6782 Cerebral ischemia: Secondary | ICD-10-CM | POA: Diagnosis not present

## 2015-02-18 DIAGNOSIS — F0151 Vascular dementia with behavioral disturbance: Secondary | ICD-10-CM | POA: Diagnosis not present

## 2015-02-25 DIAGNOSIS — R260 Ataxic gait: Secondary | ICD-10-CM | POA: Diagnosis not present

## 2015-02-25 DIAGNOSIS — M79673 Pain in unspecified foot: Secondary | ICD-10-CM | POA: Diagnosis not present

## 2015-02-25 DIAGNOSIS — B351 Tinea unguium: Secondary | ICD-10-CM | POA: Diagnosis not present

## 2015-02-25 DIAGNOSIS — L600 Ingrowing nail: Secondary | ICD-10-CM | POA: Diagnosis not present

## 2015-03-03 DIAGNOSIS — E119 Type 2 diabetes mellitus without complications: Secondary | ICD-10-CM | POA: Diagnosis not present

## 2015-03-03 DIAGNOSIS — I82409 Acute embolism and thrombosis of unspecified deep veins of unspecified lower extremity: Secondary | ICD-10-CM | POA: Diagnosis not present

## 2015-03-03 DIAGNOSIS — M6281 Muscle weakness (generalized): Secondary | ICD-10-CM | POA: Diagnosis not present

## 2015-03-03 DIAGNOSIS — I10 Essential (primary) hypertension: Secondary | ICD-10-CM | POA: Diagnosis not present

## 2015-03-04 ENCOUNTER — Emergency Department (HOSPITAL_COMMUNITY): Payer: Medicare Other

## 2015-03-04 ENCOUNTER — Encounter (HOSPITAL_COMMUNITY): Payer: Self-pay | Admitting: Emergency Medicine

## 2015-03-04 ENCOUNTER — Emergency Department (HOSPITAL_COMMUNITY)
Admission: EM | Admit: 2015-03-04 | Discharge: 2015-03-04 | Disposition: A | Discharge status: Skilled Nursing Facility | Payer: Medicare Other | Attending: Emergency Medicine | Admitting: Emergency Medicine

## 2015-03-04 DIAGNOSIS — M158 Other polyosteoarthritis: Secondary | ICD-10-CM | POA: Insufficient documentation

## 2015-03-04 DIAGNOSIS — R531 Weakness: Secondary | ICD-10-CM | POA: Diagnosis not present

## 2015-03-04 DIAGNOSIS — Z951 Presence of aortocoronary bypass graft: Secondary | ICD-10-CM | POA: Insufficient documentation

## 2015-03-04 DIAGNOSIS — Z7901 Long term (current) use of anticoagulants: Secondary | ICD-10-CM | POA: Insufficient documentation

## 2015-03-04 DIAGNOSIS — E119 Type 2 diabetes mellitus without complications: Secondary | ICD-10-CM | POA: Insufficient documentation

## 2015-03-04 DIAGNOSIS — I129 Hypertensive chronic kidney disease with stage 1 through stage 4 chronic kidney disease, or unspecified chronic kidney disease: Secondary | ICD-10-CM | POA: Diagnosis not present

## 2015-03-04 DIAGNOSIS — E785 Hyperlipidemia, unspecified: Secondary | ICD-10-CM | POA: Insufficient documentation

## 2015-03-04 DIAGNOSIS — N189 Chronic kidney disease, unspecified: Secondary | ICD-10-CM | POA: Diagnosis not present

## 2015-03-04 DIAGNOSIS — Z794 Long term (current) use of insulin: Secondary | ICD-10-CM | POA: Insufficient documentation

## 2015-03-04 DIAGNOSIS — Z8701 Personal history of pneumonia (recurrent): Secondary | ICD-10-CM | POA: Diagnosis not present

## 2015-03-04 DIAGNOSIS — R29898 Other symptoms and signs involving the musculoskeletal system: Secondary | ICD-10-CM

## 2015-03-04 DIAGNOSIS — Z86711 Personal history of pulmonary embolism: Secondary | ICD-10-CM | POA: Diagnosis not present

## 2015-03-04 DIAGNOSIS — M6281 Muscle weakness (generalized): Secondary | ICD-10-CM | POA: Insufficient documentation

## 2015-03-04 DIAGNOSIS — Z8673 Personal history of transient ischemic attack (TIA), and cerebral infarction without residual deficits: Secondary | ICD-10-CM | POA: Diagnosis not present

## 2015-03-04 DIAGNOSIS — R05 Cough: Secondary | ICD-10-CM | POA: Diagnosis not present

## 2015-03-04 DIAGNOSIS — I2510 Atherosclerotic heart disease of native coronary artery without angina pectoris: Secondary | ICD-10-CM | POA: Diagnosis not present

## 2015-03-04 DIAGNOSIS — R404 Transient alteration of awareness: Secondary | ICD-10-CM | POA: Diagnosis not present

## 2015-03-04 DIAGNOSIS — Z87442 Personal history of urinary calculi: Secondary | ICD-10-CM | POA: Insufficient documentation

## 2015-03-04 DIAGNOSIS — Z79899 Other long term (current) drug therapy: Secondary | ICD-10-CM | POA: Insufficient documentation

## 2015-03-04 DIAGNOSIS — Z9889 Other specified postprocedural states: Secondary | ICD-10-CM | POA: Diagnosis not present

## 2015-03-04 DIAGNOSIS — Z86718 Personal history of other venous thrombosis and embolism: Secondary | ICD-10-CM | POA: Diagnosis not present

## 2015-03-04 DIAGNOSIS — M79606 Pain in leg, unspecified: Secondary | ICD-10-CM | POA: Diagnosis not present

## 2015-03-04 LAB — I-STAT TROPONIN, ED: TROPONIN I, POC: 0.01 ng/mL (ref 0.00–0.08)

## 2015-03-04 LAB — BASIC METABOLIC PANEL
ANION GAP: 8 (ref 5–15)
BUN: 21 mg/dL — ABNORMAL HIGH (ref 6–20)
CALCIUM: 8.9 mg/dL (ref 8.9–10.3)
CO2: 24 mmol/L (ref 22–32)
CREATININE: 1.24 mg/dL (ref 0.61–1.24)
Chloride: 105 mmol/L (ref 101–111)
GFR calc non Af Amer: 53 mL/min — ABNORMAL LOW (ref >60)
GLUCOSE: 152 mg/dL — AB (ref 65–99)
Potassium: 3.4 mmol/L — ABNORMAL LOW (ref 3.5–5.1)
Sodium: 137 mmol/L (ref 135–145)

## 2015-03-04 LAB — URINALYSIS, ROUTINE W REFLEX MICROSCOPIC
BILIRUBIN URINE: NEGATIVE
Glucose, UA: NEGATIVE mg/dL
KETONES UR: NEGATIVE mg/dL
LEUKOCYTES UA: NEGATIVE
Nitrite: NEGATIVE
PH: 5.5 (ref 5.0–8.0)
Protein, ur: 30 mg/dL — AB
Specific Gravity, Urine: 1.013 (ref 1.005–1.030)
UROBILINOGEN UA: 1 mg/dL (ref 0.0–1.0)

## 2015-03-04 LAB — CBC
HCT: 38.7 % — ABNORMAL LOW (ref 39.0–52.0)
HEMOGLOBIN: 13 g/dL (ref 13.0–17.0)
MCH: 30.6 pg (ref 26.0–34.0)
MCHC: 33.6 g/dL (ref 30.0–36.0)
MCV: 91.1 fL (ref 78.0–100.0)
Platelets: 140 10*3/uL — ABNORMAL LOW (ref 150–400)
RBC: 4.25 MIL/uL (ref 4.22–5.81)
RDW: 13.7 % (ref 11.5–15.5)
WBC: 8 10*3/uL (ref 4.0–10.5)

## 2015-03-04 LAB — I-STAT CG4 LACTIC ACID, ED: Lactic Acid, Venous: 1.11 mmol/L (ref 0.5–2.0)

## 2015-03-04 LAB — CBG MONITORING, ED: Glucose-Capillary: 168 mg/dL — ABNORMAL HIGH (ref 65–99)

## 2015-03-04 LAB — URINE MICROSCOPIC-ADD ON

## 2015-03-04 NOTE — ED Notes (Signed)
Patient ambulated 30 feet with no assistance.

## 2015-03-04 NOTE — ED Provider Notes (Signed)
CSN: [id number]     Arrival date & time 03/04/15  1043 History   First MD Initiated Contact with Patient 03/04/15 1055     Chief Complaint  Patient presents with  . bilat leg weakness      (Consider location/radiation/quality/duration/timing/severity/associated sxs/prior Treatment) HPI Comments: Patient states weakness for the past 3-4 days. He cannot get out of a chair by himself. He normally relates a with a walker but is unable to do that also.  Patient is a 81 y.o. male presenting with weakness. The history is provided by the patient.  Weakness This is a new problem. The current episode started more than 2 days ago. The problem occurs constantly. The problem has not changed since onset.Pertinent negatives include no chest pain, no abdominal pain, no headaches and no shortness of breath. Nothing aggravates the symptoms. Nothing relieves the symptoms. He has tried nothing for the symptoms.    Past Medical History  Diagnosis Date  . Diabetes mellitus Onset approx 1980  . Hyperlipidemia   . Carotid artery occlusion   . Calf DVT (deep venous thrombosis) 2010    a. hx of right calf - chronic coumadin since 12/2008.  Recurrence shortly after d/c of coumadin 03/2013--coumadin restarted.  Marland Kitchen PVD (peripheral vascular disease)     ?Tibial dz? Re-eval 01/29/13 by Vascular MD concluded that no significant evidence of PAD was present.  . Coronary artery disease     a. s/p MI & CABG in 2000;  b. 04/2012 Cath/PTCA:  VG-> RI patent w/ 99 in native RI (PTCA only );  c.  LHC 09/20/12:  oLM 30%, mid LAD occluded, prox D1 80%, mid CFX 100, OM1 30, prox RCA 100, S-RI, native RI 80% (stable), S-OM patent, native OM2 100 (filled via R collats) - culprit, L-LAD patent, S-RCA mid 50%. - med rx; e. 11/2012 NSTEMI->med rx.  Marland Kitchen Hx pulmonary embolism 2010    a. bilateral;  b. Chronic coumadin since 12/2008  . Stroke 2010    [facility].  . Ischemic cardiomyopathy     a. 04/2012 Echo: EF 40-45%.;   b.  Echo 5/14: EF 60-65%, NL WM, Gr 1 DD  . Hypertension   . Osteoarthritis     Low back and left knee and both hips  . CKD (chronic kidney disease)     left sided chronic high grade obstruction on NM renal imaging (09/2011)--9% function compared to 90% function on right side.  . Nephrolithiasis     with left ureterolithiasis; hx of ureteral stent on left--has resulted in markedly decreased function of left kidney.  . Diabetic retinopathy associated with type 2 diabetes mellitus   . Squamous cell carcinoma 2008    on his back  . Insomnia   . History of pneumonia   . Diabetic peripheral neuropathy     was on neurontin at one time   Past Surgical History  Procedure Laterality Date  . Coronary artery bypass graft  2000    4 vessel cabg in [place], [place].  . Cardiac catheterization  04/2012    patent grafts, severe three-vessel CAD, severe diffuse small vessel disease and residual CAD. He underwent angioplasty alone to a 99% ramus intermedius lesion with resultant dissection and 70% restenosis.  . Carotid endarterectomy  2000    bilateral  . Lithotripsy      (Dr. Jeffie Pollock)  . Cardiac catheterization  09/2012    Occlusion of the native OM 2 with right-to-left collateral filling, prior balloon angioplasty site unchanged,  SVG-RCA was unchanged, mildly elevated LVEDP  . Colonoscopy  2004    Dr. Ysidro Evert in [place], [place].  . Transthoracic echocardiogram  01/2014    Mild LVH, normal LV systolic fxn ([facility])  . Carot dopp  01/2014    1-39% sten of ICA's bilat ([facility])  . Left heart catheterization with coronary/graft angiogram N/A 05/01/2012    Procedure: LEFT HEART CATHETERIZATION WITH Beatrix Fetters;  Surgeon: Sherren Mocha, MD;  Location: [facility] CATH LAB;  Service: Cardiovascular;  Laterality: N/A;  Possible angioplasty  . Left heart catheterization with coronary/graft angiogram N/A 09/20/2012    Procedure: LEFT HEART CATHETERIZATION WITH Beatrix Fetters;  Surgeon: Wellington Hampshire, MD;  Location: [facility] CATH LAB;  Service: Cardiovascular;  Laterality: N/A;   Family History  Problem Relation Age of Onset  . Stroke Mother     age 62  . Other Father     pulmonary embolus age 53  . Diabetes Sister   . Stroke Sister   . Diabetes Brother     brother was murdered  . Diabetes Sister    Social History  Substance Use Topics  . Smoking status: Never Smoker   . Smokeless tobacco: Never Used  . Alcohol Use: No    Review of Systems  Constitutional: Negative for fever.  Respiratory: Negative for cough and shortness of breath.   Cardiovascular: Negative for chest pain.  Gastrointestinal: Negative for abdominal pain.  Neurological: Positive for weakness. Negative for headaches.  All other systems reviewed and are negative.     Allergies  Doxycycline; Morphine and related; Niacin and related; and Zoloft  Home Medications   Prior to Admission medications   Medication Sig Start Date End Date Taking? Authorizing Provider  atorvastatin (LIPITOR) 80 MG tablet TAKE 1 TABLET BY MOUTH EVERY DAY Patient taking differently: TAKE 80 MG BY MOUTH DAILY 09/30/14  Yes Darlin Coco, MD  carvedilol (COREG) 6.25 MG tablet TAKE 1 TABLET (6.25 MG TOTAL) BY MOUTH 2 (TWO) TIMES DAILY WITH A MEAL. Patient taking differently: TAKE 1 TABLET (6.25 MG TOTAL) BY MOUTH DAILY WITH A MEAL. 06/25/14  Yes Darlin Coco, MD  Cholecalciferol (VITAMIN D-3) 1000 UNITS CAPS Take 2,000 Units by mouth daily.   Yes Historical Provider, MD  citalopram (CELEXA) 20 MG tablet Take 20 mg by mouth daily.   Yes Historical Provider, MD  diphenhydramine-acetaminophen (TYLENOL PM) 25-500 MG TABS Take 2 tablets by mouth at bedtime.   Yes Historical Provider, MD  docusate sodium (COLACE) 100 MG capsule Take 100 mg by mouth daily.    Yes Historical Provider, MD  donepezil (ARICEPT) 5 MG tablet Take 5 mg by mouth at bedtime.   Yes Historical Provider, MD  folic acid (FOLVITE) 1  MG tablet Take 1 mg by mouth daily.   Yes Historical Provider, MD  hydrALAZINE (APRESOLINE) 25 MG tablet TAKE 1 TABLET (25 MG TOTAL) BY MOUTH 2 (TWO) TIMES DAILY. Patient taking differently: TAKE 1 TABLET (25 MG TOTAL) BY MOUTH DAILY. 06/25/14  Yes Darlin Coco, MD  insulin glargine (LANTUS) 100 UNIT/ML injection Inject 0.2 mLs (20 Units total) into the skin every morning. 12/04/14  Yes Irene Pap, NP  isosorbide mononitrate (IMDUR) 60 MG 24 hr tablet Take 1 tablet (60 mg total) by mouth daily. 02/02/15  Yes Tammi Sou, MD  nitroGLYCERIN (NITROSTAT) 0.4 MG SL tablet Place 0.4 mg under the tongue every 5 (five) minutes x 3 doses as needed for chest pain. 05/07/12  Yes Harrell Gave  Melburn Hake, NP  OMEGA-3 FATTY ACIDS PO Take 1,000 mg by mouth daily.   Yes Historical Provider, MD  polyethylene glycol (MIRALAX / GLYCOLAX) packet Take 17 g by mouth daily. Patient taking differently: Take 17 g by mouth daily as needed for mild constipation or moderate constipation.  10/01/14  Yes Debby Freiberg, MD  warfarin (COUMADIN) 5 MG tablet TAKE 1 TABLET (5 MG TOTAL) BY MOUTH EVERY EVENING. AS DIRECTED BY DR. Modena Morrow Patient taking differently: Take 5 mg by mouth daily.  06/24/14  Yes Tammi Sou, MD  sertraline (ZOLOFT) 25 MG tablet Take 1 tablet (25 mg total) by mouth daily. Patient not taking: Reported on 03/04/2015 02/02/15   Tammi Sou, MD   There were no vitals taken for this visit. Physical Exam  Constitutional: He is oriented to person, place, and time. He appears well-developed and well-nourished. No distress.  HENT:  Head: Normocephalic and atraumatic.  Mouth/Throat: Oropharynx is clear and moist. No oropharyngeal exudate.  Eyes: EOM are normal. Pupils are equal, round, and reactive to light.  Neck: Normal range of motion. Neck supple.  Cardiovascular: Normal rate and regular rhythm.  Exam reveals no friction rub.   No murmur heard. Pulmonary/Chest: Effort normal and breath sounds  normal. No respiratory distress. He has no wheezes. He has no rales.  Abdominal: Soft. He exhibits no distension. There is no tenderness. There is no rebound.  Musculoskeletal: Normal range of motion. He exhibits no edema.  Neurological: He is alert and oriented to person, place, and time. No cranial nerve deficit or sensory deficit. He exhibits normal muscle tone. Coordination normal. GCS eye subscore is 4. GCS verbal subscore is 5. GCS motor subscore is 6.  Skin: No rash noted. He is not diaphoretic.  Nursing note and vitals reviewed.   ED Course  Procedures (including critical care time) Labs Review Labs Reviewed  CBG MONITORING, ED - Abnormal; Notable for the following:    Glucose-Capillary 168 (*)    All other components within normal limits  CBC  BASIC METABOLIC PANEL  URINALYSIS, ROUTINE W REFLEX MICROSCOPIC (NOT AT [facility])  I-STAT CG4 LACTIC ACID, ED  I-STAT TROPOININ, ED    Imaging Review No results found. I have personally reviewed and evaluated these images and lab results as part of my medical decision-making.   EKG Interpretation   Date/Time:  Wednesday March 04 2015 11:04:25 EDT Ventricular Rate:  63 PR Interval:  220 QRS Duration: 103 QT Interval:  467 QTC Calculation: 478 R Axis:   21 Text Interpretation:  Sinus rhythm Prolonged PR interval Inferior infarct,  old No significant change since last tracing Confirmed by Mingo Amber  MD,  BLAIR ([id number]) on 03/04/2015 11:07:36 AM      MDM   Final diagnoses:  Bilateral leg weakness    81 year old male here with bilateral leg weakness. Began 3 or 4 days ago. He states progressive weakness for the past several years. He had a stroke to cause bilateral weakness once before. He denies chest pain, shortness of breath, vomiting, abdominal pain, dysuria. He states some chronic low back pain and a recent sore throat with mild cough. Here he is well appearing, he has normal strength in all his extremities. Per EMS he was  unable to get out of a chair. He normally walks with a walker. We'll check labs, head CT, chest x-ray.  All imaging ok. All labs ok. I spoke with the nursing home, they stated he hadn't been getting for the past few  days, but when EMS got there today on the stretcher. Here we walked him and he stood up out of bed on his own volition and walk to the desk and back with attack. Patient discharged back to his nursing home.  Evelina Bucy, MD 03/04/15 1534

## 2015-03-04 NOTE — ED Notes (Signed)
Pt from [facility]. Per EMS pt comes in from generalized weakness in bilat legs over the past few days.  Pt had stroke few years ago which caused weakness to legs, but could get around with walker until Sunday. Pt states that he wasn't able to get up out of his chair by himself.  Pt denies any fall or other trauma.

## 2015-03-04 NOTE — ED Notes (Signed)
[place], was on hold approx 8 mins for a nurse.

## 2015-03-04 NOTE — ED Notes (Signed)
Made ptar aware of pt needing transport back to [facility].

## 2015-03-04 NOTE — ED Notes (Signed)
Bed: GY18 Expected date:  Expected time:  Means of arrival:  Comments: Ems- leg weakness

## 2015-03-12 DIAGNOSIS — Z7901 Long term (current) use of anticoagulants: Secondary | ICD-10-CM | POA: Diagnosis not present

## 2015-03-17 DIAGNOSIS — M6281 Muscle weakness (generalized): Secondary | ICD-10-CM | POA: Diagnosis not present

## 2015-04-27 DIAGNOSIS — Z7901 Long term (current) use of anticoagulants: Secondary | ICD-10-CM | POA: Diagnosis not present

## 2015-04-27 DIAGNOSIS — B351 Tinea unguium: Secondary | ICD-10-CM | POA: Diagnosis not present

## 2015-04-27 DIAGNOSIS — L600 Ingrowing nail: Secondary | ICD-10-CM | POA: Diagnosis not present

## 2015-04-27 DIAGNOSIS — M79673 Pain in unspecified foot: Secondary | ICD-10-CM | POA: Diagnosis not present

## 2015-04-27 DIAGNOSIS — R260 Ataxic gait: Secondary | ICD-10-CM | POA: Diagnosis not present

## 2015-05-28 DIAGNOSIS — Z7901 Long term (current) use of anticoagulants: Secondary | ICD-10-CM | POA: Diagnosis not present

## 2015-06-09 ENCOUNTER — Emergency Department (HOSPITAL_COMMUNITY): Payer: Medicare Other

## 2015-06-09 ENCOUNTER — Encounter (HOSPITAL_COMMUNITY): Payer: Self-pay | Admitting: Emergency Medicine

## 2015-06-09 ENCOUNTER — Other Ambulatory Visit: Payer: Self-pay

## 2015-06-09 ENCOUNTER — Inpatient Hospital Stay (HOSPITAL_COMMUNITY)
Admission: EM | Admit: 2015-06-09 | Discharge: 2015-06-12 | DRG: 470 | Disposition: A | Discharge status: Skilled Nursing Facility | Payer: Medicare Other | Source: Skilled Nursing Facility | Attending: Internal Medicine | Admitting: Internal Medicine

## 2015-06-09 DIAGNOSIS — S72009A Fracture of unspecified part of neck of unspecified femur, initial encounter for closed fracture: Secondary | ICD-10-CM

## 2015-06-09 DIAGNOSIS — I10 Essential (primary) hypertension: Secondary | ICD-10-CM | POA: Diagnosis not present

## 2015-06-09 DIAGNOSIS — I69351 Hemiplegia and hemiparesis following cerebral infarction affecting right dominant side: Secondary | ICD-10-CM | POA: Diagnosis not present

## 2015-06-09 DIAGNOSIS — Z9181 History of falling: Secondary | ICD-10-CM | POA: Diagnosis not present

## 2015-06-09 DIAGNOSIS — M21251 Flexion deformity, right hip: Secondary | ICD-10-CM | POA: Diagnosis not present

## 2015-06-09 DIAGNOSIS — I252 Old myocardial infarction: Secondary | ICD-10-CM

## 2015-06-09 DIAGNOSIS — I129 Hypertensive chronic kidney disease with stage 1 through stage 4 chronic kidney disease, or unspecified chronic kidney disease: Secondary | ICD-10-CM | POA: Diagnosis not present

## 2015-06-09 DIAGNOSIS — T148 Other injury of unspecified body region: Secondary | ICD-10-CM | POA: Diagnosis not present

## 2015-06-09 DIAGNOSIS — N189 Chronic kidney disease, unspecified: Secondary | ICD-10-CM | POA: Diagnosis not present

## 2015-06-09 DIAGNOSIS — Y92129 Unspecified place in nursing home as the place of occurrence of the external cause: Secondary | ICD-10-CM

## 2015-06-09 DIAGNOSIS — W1830XA Fall on same level, unspecified, initial encounter: Secondary | ICD-10-CM | POA: Diagnosis present

## 2015-06-09 DIAGNOSIS — Z951 Presence of aortocoronary bypass graft: Secondary | ICD-10-CM

## 2015-06-09 DIAGNOSIS — Z955 Presence of coronary angioplasty implant and graft: Secondary | ICD-10-CM | POA: Diagnosis not present

## 2015-06-09 DIAGNOSIS — S72091A Other fracture of head and neck of right femur, initial encounter for closed fracture: Secondary | ICD-10-CM | POA: Diagnosis not present

## 2015-06-09 DIAGNOSIS — S72001A Fracture of unspecified part of neck of right femur, initial encounter for closed fracture: Secondary | ICD-10-CM | POA: Diagnosis present

## 2015-06-09 DIAGNOSIS — I255 Ischemic cardiomyopathy: Secondary | ICD-10-CM | POA: Diagnosis not present

## 2015-06-09 DIAGNOSIS — E785 Hyperlipidemia, unspecified: Secondary | ICD-10-CM | POA: Diagnosis present

## 2015-06-09 DIAGNOSIS — R2681 Unsteadiness on feet: Secondary | ICD-10-CM | POA: Diagnosis not present

## 2015-06-09 DIAGNOSIS — S72011A Unspecified intracapsular fracture of right femur, initial encounter for closed fracture: Secondary | ICD-10-CM | POA: Diagnosis not present

## 2015-06-09 DIAGNOSIS — N183 Chronic kidney disease, stage 3 (moderate): Secondary | ICD-10-CM | POA: Diagnosis not present

## 2015-06-09 DIAGNOSIS — Z7901 Long term (current) use of anticoagulants: Secondary | ICD-10-CM | POA: Diagnosis not present

## 2015-06-09 DIAGNOSIS — S72001D Fracture of unspecified part of neck of right femur, subsequent encounter for closed fracture with routine healing: Secondary | ICD-10-CM | POA: Diagnosis not present

## 2015-06-09 DIAGNOSIS — E1122 Type 2 diabetes mellitus with diabetic chronic kidney disease: Secondary | ICD-10-CM | POA: Diagnosis not present

## 2015-06-09 DIAGNOSIS — I69354 Hemiplegia and hemiparesis following cerebral infarction affecting left non-dominant side: Secondary | ICD-10-CM

## 2015-06-09 DIAGNOSIS — E118 Type 2 diabetes mellitus with unspecified complications: Secondary | ICD-10-CM | POA: Diagnosis not present

## 2015-06-09 DIAGNOSIS — E1142 Type 2 diabetes mellitus with diabetic polyneuropathy: Secondary | ICD-10-CM | POA: Diagnosis not present

## 2015-06-09 DIAGNOSIS — N179 Acute kidney failure, unspecified: Secondary | ICD-10-CM | POA: Diagnosis not present

## 2015-06-09 DIAGNOSIS — Z86718 Personal history of other venous thrombosis and embolism: Secondary | ICD-10-CM | POA: Diagnosis not present

## 2015-06-09 DIAGNOSIS — S72041A Displaced fracture of base of neck of right femur, initial encounter for closed fracture: Principal | ICD-10-CM | POA: Diagnosis present

## 2015-06-09 DIAGNOSIS — Z794 Long term (current) use of insulin: Secondary | ICD-10-CM | POA: Diagnosis not present

## 2015-06-09 DIAGNOSIS — I2510 Atherosclerotic heart disease of native coronary artery without angina pectoris: Secondary | ICD-10-CM | POA: Diagnosis not present

## 2015-06-09 DIAGNOSIS — M25511 Pain in right shoulder: Secondary | ICD-10-CM | POA: Diagnosis not present

## 2015-06-09 DIAGNOSIS — E1151 Type 2 diabetes mellitus with diabetic peripheral angiopathy without gangrene: Secondary | ICD-10-CM | POA: Diagnosis not present

## 2015-06-09 DIAGNOSIS — Z86711 Personal history of pulmonary embolism: Secondary | ICD-10-CM

## 2015-06-09 DIAGNOSIS — M25551 Pain in right hip: Secondary | ICD-10-CM | POA: Diagnosis not present

## 2015-06-09 DIAGNOSIS — M6281 Muscle weakness (generalized): Secondary | ICD-10-CM | POA: Diagnosis not present

## 2015-06-09 DIAGNOSIS — I739 Peripheral vascular disease, unspecified: Secondary | ICD-10-CM | POA: Diagnosis not present

## 2015-06-09 DIAGNOSIS — Z5181 Encounter for therapeutic drug level monitoring: Secondary | ICD-10-CM | POA: Diagnosis not present

## 2015-06-09 LAB — CBC WITH DIFFERENTIAL/PLATELET
BASOS ABS: 0 10*3/uL (ref 0.0–0.1)
Basophils Relative: 0 %
EOS ABS: 0.2 10*3/uL (ref 0.0–0.7)
EOS PCT: 1 %
HCT: 41.1 % (ref 39.0–52.0)
Hemoglobin: 13.3 g/dL (ref 13.0–17.0)
Lymphocytes Relative: 11 %
Lymphs Abs: 1.6 10*3/uL (ref 0.7–4.0)
MCH: 30.2 pg (ref 26.0–34.0)
MCHC: 32.4 g/dL (ref 30.0–36.0)
MCV: 93.4 fL (ref 78.0–100.0)
MONOS PCT: 5 %
Monocytes Absolute: 0.8 10*3/uL (ref 0.1–1.0)
Neutro Abs: 11.8 10*3/uL — ABNORMAL HIGH (ref 1.7–7.7)
Neutrophils Relative %: 83 %
PLATELETS: 136 10*3/uL — AB (ref 150–400)
RBC: 4.4 MIL/uL (ref 4.22–5.81)
RDW: 13.8 % (ref 11.5–15.5)
WBC: 14.3 10*3/uL — ABNORMAL HIGH (ref 4.0–10.5)

## 2015-06-09 LAB — PROTIME-INR
INR: 2.23 — ABNORMAL HIGH (ref 0.00–1.49)
PROTHROMBIN TIME: 24.5 s — AB (ref 11.6–15.2)

## 2015-06-09 LAB — URINALYSIS, ROUTINE W REFLEX MICROSCOPIC
Bilirubin Urine: NEGATIVE
Glucose, UA: NEGATIVE mg/dL
Ketones, ur: NEGATIVE mg/dL
Leukocytes, UA: NEGATIVE
Nitrite: NEGATIVE
Protein, ur: 30 mg/dL — AB
Specific Gravity, Urine: 1.013 (ref 1.005–1.030)
pH: 7 (ref 5.0–8.0)

## 2015-06-09 LAB — URINE MICROSCOPIC-ADD ON: WBC UA: NONE SEEN WBC/hpf (ref 0–5)

## 2015-06-09 LAB — BASIC METABOLIC PANEL
Anion gap: 8 (ref 5–15)
BUN: 28 mg/dL — ABNORMAL HIGH (ref 6–20)
CALCIUM: 8.9 mg/dL (ref 8.9–10.3)
CO2: 26 mmol/L (ref 22–32)
CREATININE: 1.4 mg/dL — AB (ref 0.61–1.24)
Chloride: 105 mmol/L (ref 101–111)
GFR calc non Af Amer: 46 mL/min — ABNORMAL LOW (ref >60)
GFR, EST AFRICAN AMERICAN: 53 mL/min — AB (ref >60)
Glucose, Bld: 113 mg/dL — ABNORMAL HIGH (ref 65–99)
Potassium: 4 mmol/L (ref 3.5–5.1)
Sodium: 139 mmol/L (ref 135–145)

## 2015-06-09 LAB — CBG MONITORING, ED: GLUCOSE-CAPILLARY: 119 mg/dL — AB (ref 65–99)

## 2015-06-09 MED ORDER — HYDRALAZINE HCL 20 MG/ML IJ SOLN
10.0000 mg | INTRAMUSCULAR | Status: DC | PRN
Start: 2015-06-09 — End: 2015-06-12
  Administered 2015-06-09: 10 mg via INTRAVENOUS
  Filled 2015-06-09: qty 1

## 2015-06-09 MED ORDER — POLYETHYLENE GLYCOL 3350 17 G PO PACK
17.0000 g | PACK | Freq: Every day | ORAL | Status: DC | PRN
  Administered 2015-06-12: 17 g via ORAL
  Filled 2015-06-09 (×2): qty 1

## 2015-06-09 MED ORDER — CARVEDILOL 6.25 MG PO TABS
6.2500 mg | ORAL_TABLET | Freq: Two times a day (BID) | ORAL | Status: DC
  Administered 2015-06-10 – 2015-06-12 (×4): 6.25 mg via ORAL
  Filled 2015-06-09 (×5): qty 1

## 2015-06-09 MED ORDER — ONDANSETRON HCL 4 MG/2ML IJ SOLN
4.0000 mg | Freq: Four times a day (QID) | INTRAMUSCULAR | Status: DC | PRN
  Administered 2015-06-09: 4 mg via INTRAVENOUS
  Filled 2015-06-09: qty 2

## 2015-06-09 MED ORDER — DOCUSATE SODIUM 100 MG PO CAPS
100.0000 mg | ORAL_CAPSULE | Freq: Every day | ORAL | Status: DC
  Administered 2015-06-09 – 2015-06-10 (×2): 100 mg via ORAL
  Filled 2015-06-09 (×2): qty 1

## 2015-06-09 MED ORDER — HYDRALAZINE HCL 25 MG PO TABS
25.0000 mg | ORAL_TABLET | Freq: Two times a day (BID) | ORAL | Status: DC
  Administered 2015-06-10 – 2015-06-12 (×6): 25 mg via ORAL
  Filled 2015-06-09 (×7): qty 1

## 2015-06-09 MED ORDER — NITROGLYCERIN 0.4 MG SL SUBL
0.4000 mg | SUBLINGUAL_TABLET | SUBLINGUAL | Status: DC | PRN
Start: 2015-06-09 — End: 2015-06-12

## 2015-06-09 MED ORDER — DONEPEZIL HCL 10 MG PO TABS
10.0000 mg | ORAL_TABLET | Freq: Every day | ORAL | Status: DC
  Administered 2015-06-10 – 2015-06-11 (×3): 10 mg via ORAL
  Filled 2015-06-09 (×4): qty 1

## 2015-06-09 MED ORDER — CITALOPRAM HYDROBROMIDE 20 MG PO TABS
20.0000 mg | ORAL_TABLET | Freq: Every day | ORAL | Status: DC
  Administered 2015-06-10 – 2015-06-12 (×3): 20 mg via ORAL
  Filled 2015-06-09 (×3): qty 1

## 2015-06-09 MED ORDER — HYDROCODONE-ACETAMINOPHEN 5-325 MG PO TABS
1.0000 | ORAL_TABLET | Freq: Four times a day (QID) | ORAL | Status: DC | PRN
  Administered 2015-06-10 – 2015-06-11 (×3): 2 via ORAL
  Filled 2015-06-09 (×3): qty 2

## 2015-06-09 MED ORDER — MORPHINE SULFATE (PF) 2 MG/ML IV SOLN
0.5000 mg | INTRAVENOUS | Status: DC | PRN
  Administered 2015-06-10: 0.5 mg via INTRAVENOUS
  Filled 2015-06-09 (×2): qty 1

## 2015-06-09 MED ORDER — INSULIN GLARGINE 100 UNIT/ML ~~LOC~~ SOLN
10.0000 [IU] | Freq: Every day | SUBCUTANEOUS | Status: DC
  Administered 2015-06-10 – 2015-06-12 (×3): 10 [IU] via SUBCUTANEOUS
  Filled 2015-06-09 (×3): qty 0.1

## 2015-06-09 MED ORDER — HYDRALAZINE HCL 20 MG/ML IJ SOLN: 10.0000 mg | INTRAMUSCULAR | Status: DC | PRN

## 2015-06-09 MED ORDER — FENTANYL CITRATE (PF) 100 MCG/2ML IJ SOLN
25.0000 ug | Freq: Once | INTRAMUSCULAR | Status: AC
  Administered 2015-06-09: 25 ug via INTRAVENOUS
  Filled 2015-06-09: qty 2

## 2015-06-09 MED ORDER — FOLIC ACID 1 MG PO TABS
1.0000 mg | ORAL_TABLET | Freq: Every day | ORAL | Status: DC
  Administered 2015-06-09 – 2015-06-12 (×4): 1 mg via ORAL
  Filled 2015-06-09 (×4): qty 1

## 2015-06-09 MED ORDER — ISOSORBIDE MONONITRATE ER 60 MG PO TB24
60.0000 mg | ORAL_TABLET | Freq: Every day | ORAL | Status: DC
Start: 2015-06-09 — End: 2015-06-12
  Administered 2015-06-10 – 2015-06-11 (×3): 60 mg via ORAL
  Filled 2015-06-09 (×4): qty 1

## 2015-06-09 MED ORDER — ATORVASTATIN CALCIUM 80 MG PO TABS
80.0000 mg | ORAL_TABLET | Freq: Every day | ORAL | Status: DC
Start: 2015-06-10 — End: 2015-06-12
  Administered 2015-06-11: 80 mg via ORAL
  Filled 2015-06-09: qty 1

## 2015-06-09 MED ORDER — INSULIN ASPART 100 UNIT/ML ~~LOC~~ SOLN
0.0000 [IU] | SUBCUTANEOUS | Status: DC
  Administered 2015-06-10 (×2): 1 [IU] via SUBCUTANEOUS
  Administered 2015-06-10: 2 [IU] via SUBCUTANEOUS
  Administered 2015-06-10 – 2015-06-11 (×4): 1 [IU] via SUBCUTANEOUS
  Administered 2015-06-12: 2 [IU] via SUBCUTANEOUS

## 2015-06-09 NOTE — ED Notes (Signed)
Patients daughter, Mitchum Urdaneta [phone], would NOT like surgery to be performed until the orthopedic doctor speaks with her. Informed Ivin Booty, RN at [facility].

## 2015-06-09 NOTE — ED Notes (Addendum)
Spoke with patients daughters, she was concerned about having a CT scan performed due to the [place] reporting to her that pt hit his head. Pt reports he didn't hit his head and is refusing the scan. Pt is alert, oriented x 4.

## 2015-06-09 NOTE — H&P (Signed)
[facility] History and Physical  Wesley [name] [id number] DOB: 17-Feb-1932 DOA: 06/09/2015  Referring physician: EDP PCP: Tammi Sou, MD   Chief Complaint: Fall, hip pain   HPI: Wesley [name] is a 82 y.o. male who suffered a mechanical fall while leaning forwards reaching for something at his NH.  He is non-ambulatory at baseline.  Had immediate R hip pain after fall, pain is severe with any movement even being rolled over in bed to clean him up.  Better when lying still.  Review of Systems: Systems reviewed.  As above, otherwise negative  Past Medical History  Diagnosis Date  . Diabetes mellitus Onset approx 1980  . Hyperlipidemia   . Carotid artery occlusion   . Calf DVT (deep venous thrombosis) ([place]) 2010    a. hx of right calf - chronic coumadin since 12/2008.  Recurrence shortly after d/c of coumadin 03/2013--coumadin restarted.  Marland Kitchen PVD (peripheral vascular disease) ([place])     ?Tibial dz? Re-eval 01/29/13 by Vascular MD concluded that no significant evidence of PAD was present.  . Coronary artery disease     a. s/p MI & CABG in 2000;  b. 04/2012 Cath/PTCA:  VG-> RI patent w/ 99 in native RI (PTCA only );  c.  LHC 09/20/12:  oLM 30%, mid LAD occluded, prox D1 80%, mid CFX 100, OM1 30, prox RCA 100, S-RI, native RI 80% (stable), S-OM patent, native OM2 100 (filled via R collats) - culprit, L-LAD patent, S-RCA mid 50%. - med rx; e. 11/2012 NSTEMI->med rx.  Marland Kitchen Hx pulmonary embolism 2010    a. bilateral;  b. Chronic coumadin since 12/2008  . Stroke [facility]) 2010    [facility].  . Ischemic cardiomyopathy     a. 04/2012 Echo: EF 40-45%.;  b.  Echo 5/14: EF 60-65%, NL WM, Gr 1 DD  . Hypertension   . Osteoarthritis     Low back and left knee and both hips  . CKD (chronic kidney disease)     left sided chronic high grade obstruction on NM renal imaging (09/2011)--9% function compared to 90% function on right side.  . Nephrolithiasis     with left  ureterolithiasis; hx of ureteral stent on left--has resulted in markedly decreased function of left kidney.  . Diabetic retinopathy associated with type 2 diabetes mellitus ([place])   . Squamous cell carcinoma ([place]) 2008    on his back  . Insomnia   . History of pneumonia   . Diabetic peripheral neuropathy (HCC)     was on neurontin at one time   Past Surgical History  Procedure Laterality Date  . Coronary artery bypass graft  2000    4 vessel cabg in [place], [place].  . Cardiac catheterization  04/2012    patent grafts, severe three-vessel CAD, severe diffuse small vessel disease and residual CAD. He underwent angioplasty alone to a 99% ramus intermedius lesion with resultant dissection and 70% restenosis.  . Carotid endarterectomy  2000    bilateral  . Lithotripsy      (Dr. Jeffie Pollock)  . Cardiac catheterization  09/2012    Occlusion of the native OM 2 with right-to-left collateral filling, prior balloon angioplasty site unchanged, SVG-RCA was unchanged, mildly elevated LVEDP  . Colonoscopy  2004    Dr. Ysidro Evert in [place], [place].  . Transthoracic echocardiogram  01/2014    Mild LVH, normal LV systolic fxn ([facility])  . Carot dopp  01/2014    1-39% sten of ICA's bilat (  [facility])  . Left heart catheterization with coronary/graft angiogram N/A 05/01/2012    Procedure: LEFT HEART CATHETERIZATION WITH Beatrix Fetters;  Surgeon: Sherren Mocha, MD;  Location: [facility] CATH LAB;  Service: Cardiovascular;  Laterality: N/A;  Possible angioplasty  . Left heart catheterization with coronary/graft angiogram N/A 09/20/2012    Procedure: LEFT HEART CATHETERIZATION WITH Beatrix Fetters;  Surgeon: Wellington Hampshire, MD;  Location: [facility] CATH LAB;  Service: Cardiovascular;  Laterality: N/A;   Social History:  reports that he has never smoked. He has never used smokeless tobacco. He reports that he does not drink alcohol or use illicit drugs.  Allergies  Allergen Reactions  .  Doxycycline Swelling    swelling  . Morphine And Related Nausea Only    NAUSEA    . Niacin And Related Other (See Comments)    Hot flashes  . Zoloft [Sertraline Hcl]     Listed on [facility]    Family History  Problem Relation Age of Onset  . Stroke Mother     age 69  . Other Father     pulmonary embolus age 15  . Diabetes Sister   . Stroke Sister   . Diabetes Brother     brother was murdered  . Diabetes Sister      Prior to Admission medications   Medication Sig Start Date End Date Taking? Authorizing Provider  atorvastatin (LIPITOR) 80 MG tablet TAKE 1 TABLET BY MOUTH EVERY DAY Patient taking differently: TAKE 80 MG BY MOUTH DAILY 09/30/14  Yes Darlin Coco, MD  carvedilol (COREG) 6.25 MG tablet TAKE 1 TABLET (6.25 MG TOTAL) BY MOUTH 2 (TWO) TIMES DAILY WITH A MEAL. Patient taking differently: TAKE 1 TABLET (6.25 MG TOTAL) BY MOUTH DAILY WITH A MEAL. 06/25/14  Yes Darlin Coco, MD  Cholecalciferol (VITAMIN D-3) 1000 UNITS CAPS Take 2,000 Units by mouth daily.   Yes Historical Provider, MD  citalopram (CELEXA) 20 MG tablet Take 20 mg by mouth daily.   Yes Historical Provider, MD  diphenhydramine-acetaminophen (TYLENOL PM) 25-500 MG TABS Take 2 tablets by mouth at bedtime.   Yes Historical Provider, MD  docusate sodium (COLACE) 100 MG capsule Take 100 mg by mouth daily.    Yes Historical Provider, MD  donepezil (ARICEPT) 10 MG tablet Take 10 mg by mouth at bedtime.   Yes Historical Provider, MD  folic acid (FOLVITE) 1 MG tablet Take 1 mg by mouth daily.   Yes Historical Provider, MD  hydrALAZINE (APRESOLINE) 25 MG tablet TAKE 1 TABLET (25 MG TOTAL) BY MOUTH 2 (TWO) TIMES DAILY. Patient taking differently: TAKE 1 TABLET (25 MG TOTAL) BY MOUTH DAILY. 06/25/14  Yes Darlin Coco, MD  insulin glargine (LANTUS) 100 UNIT/ML injection Inject 0.2 mLs (20 Units total) into the skin every morning. 12/04/14  Yes Irene Pap, NP  isosorbide mononitrate (IMDUR) 60 MG 24 hr tablet Take 1  tablet (60 mg total) by mouth daily. Patient taking differently: Take 60 mg by mouth at bedtime.  02/02/15  Yes Tammi Sou, MD  nitroGLYCERIN (NITROSTAT) 0.4 MG SL tablet Place 0.4 mg under the tongue every 5 (five) minutes x 3 doses as needed for chest pain. 05/07/12  Yes Rogelia Mire, NP  OMEGA-3 FATTY ACIDS PO Take 1,000 mg by mouth daily.   Yes Historical Provider, MD  polyethylene glycol (MIRALAX / GLYCOLAX) packet Take 17 g by mouth daily. Patient taking differently: Take 17 g by mouth daily as needed for mild constipation or moderate constipation.  10/01/14  Yes Debby Freiberg, MD  warfarin (COUMADIN) 6 MG tablet Take 6 mg by mouth daily.   Yes Historical Provider, MD   Physical Exam: Filed Vitals:   06/09/15 1900 06/09/15 2026  BP: 209/85 213/81  Pulse: 59 67  Temp:  97.5 F (36.4 C)  Resp:  20    BP 213/81 mmHg  Pulse 67  Temp(Src) 97.5 F (36.4 C) (Oral)  Resp 20  Ht 6\' 2"  (1.88 m)  Wt 95.255 kg (210 lb)  BMI 26.95 kg/m2  SpO2 97%  General Appearance:    Alert, oriented, no distress, appears stated age  Head:    Normocephalic, atraumatic  Eyes:    PERRL, EOMI, sclera non-icteric        Nose:   Nares without drainage or epistaxis. Mucosa, turbinates normal  Throat:   Moist mucous membranes. Oropharynx without erythema or exudate.  Neck:   Supple. No carotid bruits.  No thyromegaly.  No lymphadenopathy.   Back:     No CVA tenderness, no spinal tenderness  Lungs:     Clear to auscultation bilaterally, without wheezes, rhonchi or rales  Chest wall:    No tenderness to palpitation  Heart:    Regular rate and rhythm without murmurs, gallops, rubs  Abdomen:     Soft, non-tender, nondistended, normal bowel sounds, no organomegaly  Genitalia:    deferred  Rectal:    deferred  Extremities:   No clubbing, cyanosis or edema.  Pulses:   2+ and symmetric all extremities  Skin:   Skin color, texture, turgor normal, no rashes or lesions  Lymph nodes:   Cervical,  supraclavicular, and axillary nodes normal  Neurologic:   CNII-XII intact. Normal strength, sensation and reflexes      throughout    Labs on Admission:  Basic Metabolic Panel:  Recent Labs Lab 06/09/15 2004  NA 139  K 4.0  CL 105  CO2 26  GLUCOSE 113*  BUN 28*  CREATININE 1.40*  CALCIUM 8.9   Liver Function Tests: No results for input(s): AST, ALT, ALKPHOS, BILITOT, PROT, ALBUMIN in the last 168 hours. No results for input(s): LIPASE, AMYLASE in the last 168 hours. No results for input(s): AMMONIA in the last 168 hours. CBC:  Recent Labs Lab 06/09/15 2004  WBC 14.3*  NEUTROABS 11.8*  HGB 13.3  HCT 41.1  MCV 93.4  PLT 136*   Cardiac Enzymes: No results for input(s): CKTOTAL, CKMB, CKMBINDEX, TROPONINI in the last 168 hours.  BNP (last 3 results) No results for input(s): PROBNP in the last 8760 hours. CBG: No results for input(s): GLUCAP in the last 168 hours.  Radiological Exams on Admission: Dg Chest 1 View  06/09/2015  CLINICAL DATA:  RIGHT hip pain EXAM: CHEST 1 VIEW COMPARISON:  03/04/2015, CT abdomen 10/01/2014 FINDINGS: Sternotomy wires overlie normal cardiac silhouette. Nodular density at the LEFT lung base which corresponds to pleural-parenchymal scarring and thickening on comparison CT of 10/01/2014. Chronic bronchitic markings centrally. No pulmonary edema or pleural fluid. No pneumothorax. Atherosclerotic calcification of the abdominal aorta. IMPRESSION: 1. No acute cardiopulmonary process. 2. Chronic parenchymal nodular scarring at the LEFT lung base. 3. Chronic bronchitic markings. 4. Atherosclerotic calcification aorta. Electronically Signed   By: Suzy Bouchard M.D.   On: 06/09/2015 18:58   Dg Hip Unilat With Pelvis 2-3 Views Right  06/09/2015  CLINICAL DATA:  Pt states he was reaching for something and fell. Pt complains of right hip pain. EXAM: DG HIP (WITH OR WITHOUT PELVIS)  2-3V RIGHT COMPARISON:  CT 10/01/2014 FINDINGS: There is a comminuted  fracture involving the base of the right femoral neck. No evidence for dislocation. There are degenerative changes involving both hips and lower lumbar spine. There is atherosclerotic calcification of vessels. Pelvis is intact. IMPRESSION: Comminuted fracture of the right femoral neck. Electronically Signed   By: Nolon Nations M.D.   On: 06/09/2015 18:54    EKG: Independently reviewed.  Assessment/Plan Principal Problem:   Fracture of femoral neck, right (HCC) Active Problems:   Essential hypertension   Diabetes mellitus with complication (HCC)   Anticoagulated on Coumadin   Closed right hip fracture ([place])   1. Fracture of R femoral neck - 1. Dr. Sharol Given [facility]. Hip fx pathway 3. Non-ambulatory at baseline, but I told patient that they would usually fix these anyhow because (as he unfortunately has already experienced this evening), just moving around bed is very painful.  Will let ortho discuss further with patient at [facility] 4. NPO after midnight 5. Holding coumadin, INR 2.2, recheck INR in AM ordered 6. EKG pending 2. HTN - 1. Continue home meds 2. PRN hydralazine IV ordered 3. DM - 1. Reduce lantus to 10 units daily while NPO 2. SSI Q4H    Code Status: Full  Family Communication: no family in room Disposition Plan: Admit to inpatient   Time spent: 60 min  Nate Common M. [facility] Pager [phone]  If 7AM-7PM, please contact the day team taking care of the patient [email or web address] Password [email or web address] 06/09/2015, 9:30 PM

## 2015-06-09 NOTE — ED Notes (Signed)
Bed: [id number] Expected date:  Expected time:  Means of arrival:  Comments: 81 y/o M, fall, hip dislocation

## 2015-06-09 NOTE — ED Notes (Signed)
Patient transported to X-ray 

## 2015-06-09 NOTE — ED Notes (Signed)
Attempted to call report to [facility]. Apolonio Schneiders, RN will call back.

## 2015-06-09 NOTE — ED Notes (Signed)
Pt brought back from xray. 

## 2015-06-09 NOTE — ED Provider Notes (Signed)
Medical screening examination/treatment/procedure(s) were conducted as a shared visit with non-physician practitioner(s) and myself.  I personally evaluated the patient during the encounter.   EKG Interpretation None      Results for orders placed or performed during the hospital encounter of 06/09/15  CBC with Differential/Platelet  Result Value Ref Range   WBC 14.3 (H) 4.0 - 10.5 K/uL   RBC 4.40 4.22 - 5.81 MIL/uL   Hemoglobin 13.3 13.0 - 17.0 g/dL   HCT 41.1 39.0 - 52.0 %   MCV 93.4 78.0 - 100.0 fL   MCH 30.2 26.0 - 34.0 pg   MCHC 32.4 30.0 - 36.0 g/dL   RDW 13.8 11.5 - 15.5 %   Platelets 136 (L) 150 - 400 K/uL   Neutrophils Relative % 83 %   Neutro Abs 11.8 (H) 1.7 - 7.7 K/uL   Lymphocytes Relative 11 %   Lymphs Abs 1.6 0.7 - 4.0 K/uL   Monocytes Relative 5 %   Monocytes Absolute 0.8 0.1 - 1.0 K/uL   Eosinophils Relative 1 %   Eosinophils Absolute 0.2 0.0 - 0.7 K/uL   Basophils Relative 0 %   Basophils Absolute 0.0 0.0 - 0.1 K/uL  Urinalysis, Routine w reflex microscopic (not at [facility])  Result Value Ref Range   Color, Urine YELLOW YELLOW   APPearance CLEAR CLEAR   Specific Gravity, Urine 1.013 1.005 - 1.030   pH 7.0 5.0 - 8.0   Glucose, UA NEGATIVE NEGATIVE mg/dL   Hgb urine dipstick TRACE (A) NEGATIVE   Bilirubin Urine NEGATIVE NEGATIVE   Ketones, ur NEGATIVE NEGATIVE mg/dL   Protein, ur 30 (A) NEGATIVE mg/dL   Nitrite NEGATIVE NEGATIVE   Leukocytes, UA NEGATIVE NEGATIVE  Urine microscopic-add on  Result Value Ref Range   Squamous Epithelial / LPF 0-5 (A) NONE SEEN   WBC, UA NONE SEEN 0 - 5 WBC/hpf   RBC / HPF 0-5 0 - 5 RBC/hpf   Bacteria, UA RARE (A) NONE SEEN   Results for orders placed or performed during the hospital encounter of 06/09/15  CBC with Differential/Platelet  Result Value Ref Range   WBC 14.3 (H) 4.0 - 10.5 K/uL   RBC 4.40 4.22 - 5.81 MIL/uL   Hemoglobin 13.3 13.0 - 17.0 g/dL   HCT 41.1 39.0 - 52.0 %   MCV 93.4 78.0 - 100.0 fL   MCH 30.2  26.0 - 34.0 pg   MCHC 32.4 30.0 - 36.0 g/dL   RDW 13.8 11.5 - 15.5 %   Platelets 136 (L) 150 - 400 K/uL   Neutrophils Relative % 83 %   Neutro Abs 11.8 (H) 1.7 - 7.7 K/uL   Lymphocytes Relative 11 %   Lymphs Abs 1.6 0.7 - 4.0 K/uL   Monocytes Relative 5 %   Monocytes Absolute 0.8 0.1 - 1.0 K/uL   Eosinophils Relative 1 %   Eosinophils Absolute 0.2 0.0 - 0.7 K/uL   Basophils Relative 0 %   Basophils Absolute 0.0 0.0 - 0.1 K/uL  Urinalysis, Routine w reflex microscopic (not at [facility])  Result Value Ref Range   Color, Urine YELLOW YELLOW   APPearance CLEAR CLEAR   Specific Gravity, Urine 1.013 1.005 - 1.030   pH 7.0 5.0 - 8.0   Glucose, UA NEGATIVE NEGATIVE mg/dL   Hgb urine dipstick TRACE (A) NEGATIVE   Bilirubin Urine NEGATIVE NEGATIVE   Ketones, ur NEGATIVE NEGATIVE mg/dL   Protein, ur 30 (A) NEGATIVE mg/dL   Nitrite NEGATIVE NEGATIVE   Leukocytes,  UA NEGATIVE NEGATIVE  Urine microscopic-add on  Result Value Ref Range   Squamous Epithelial / LPF 0-5 (A) NONE SEEN   WBC, UA NONE SEEN 0 - 5 WBC/hpf   RBC / HPF 0-5 0 - 5 RBC/hpf   Bacteria, UA RARE (A) NONE SEEN   Dg Chest 1 View  06/09/2015  CLINICAL DATA:  RIGHT hip pain EXAM: CHEST 1 VIEW COMPARISON:  03/04/2015, CT abdomen 10/01/2014 FINDINGS: Sternotomy wires overlie normal cardiac silhouette. Nodular density at the LEFT lung base which corresponds to pleural-parenchymal scarring and thickening on comparison CT of 10/01/2014. Chronic bronchitic markings centrally. No pulmonary edema or pleural fluid. No pneumothorax. Atherosclerotic calcification of the abdominal aorta. IMPRESSION: 1. No acute cardiopulmonary process. 2. Chronic parenchymal nodular scarring at the LEFT lung base. 3. Chronic bronchitic markings. 4. Atherosclerotic calcification aorta. Electronically Signed   By: Suzy Bouchard M.D.   On: 06/09/2015 18:58   Dg Hip Unilat With Pelvis 2-3 Views Right  06/09/2015  CLINICAL DATA:  Pt states he was reaching for  something and fell. Pt complains of right hip pain. EXAM: DG HIP (WITH OR WITHOUT PELVIS) 2-3V RIGHT COMPARISON:  CT 10/01/2014 FINDINGS: There is a comminuted fracture involving the base of the right femoral neck. No evidence for dislocation. There are degenerative changes involving both hips and lower lumbar spine. There is atherosclerotic calcification of vessels. Pelvis is intact. IMPRESSION: Comminuted fracture of the right femoral neck. Electronically Signed   By: Nolon Nations M.D.   On: 06/09/2015 18:54    The patient with right femoral neck fracture. Patient was in a sitting position when he bent over and fell. Nursing home reported that patient hit his head patient denies that he hit his head. The fall apparently was not witnessed. Patient is on Coumadin. We ordered CT head but patient refuses it. Patient states that he sometimes does walk with a walker but does use a wheelchair a lot. We'll have orthopedics to determine whether repair of the hip fracture is appropriate or not. Patient will require medical admission. Patient with tenderness to palpation to the right hip otherwise patient is alert answers questions and follows commands. Refill distally to the right leg is 2 seconds. Good movement of the toes.  Fredia Sorrow, MD 06/09/15 2040

## 2015-06-09 NOTE — ED Notes (Signed)
Bilateral petal pulse

## 2015-06-09 NOTE — ED Notes (Signed)
Per EMS pt reaching for something at [facility] and lost balance. Pt has right hip dislocation.  Pt was given 150 Fentanyl by EMS.

## 2015-06-09 NOTE — ED Notes (Signed)
Pt hygiene performed. Pt had a soiled diaper. Changed sheets, incont pad, and diaper. Pt tolerated it fair. Dr. Alcario Drought at bedside.

## 2015-06-09 NOTE — Progress Notes (Signed)
CSW attempted to speak with patient. However, nurse and nurse tech conducting procedure at bedside. CSW will attempt to see later.  Nurse states that patient will be transported to [place], [place] ED CSW 06/09/2015 10:06 PM

## 2015-06-09 NOTE — ED Provider Notes (Signed)
CSN: [id number]     Arrival date & time 06/09/15  1752 History   First MD Initiated Contact with Patient 06/09/15 1817     Chief Complaint  Patient presents with  . Hip Pain     (Consider location/radiation/quality/duration/timing/severity/associated sxs/prior Treatment) HPI Comments: Patient with a mechanical fall at nursing facility, landing on right hip.  Patient reports about a 3 month history of urinary frequency and difficulty with bladder control.  Wears an adult diaper.  Patient is a 82 y.o. male presenting with hip pain. The history is provided by the patient. No language interpreter was used.  Hip Pain This is a new problem. The current episode started today. The problem has been unchanged. Associated symptoms include arthralgias and urinary symptoms. Pertinent negatives include no abdominal pain.    Past Medical History  Diagnosis Date  . Diabetes mellitus Onset approx 1980  . Hyperlipidemia   . Carotid artery occlusion   . Calf DVT (deep venous thrombosis) ([place]) 2010    a. hx of right calf - chronic coumadin since 12/2008.  Recurrence shortly after d/c of coumadin 03/2013--coumadin restarted.  Marland Kitchen PVD (peripheral vascular disease) ([place])     ?Tibial dz? Re-eval 01/29/13 by Vascular MD concluded that no significant evidence of PAD was present.  . Coronary artery disease     a. s/p MI & CABG in 2000;  b. 04/2012 Cath/PTCA:  VG-> RI patent w/ 99 in native RI (PTCA only );  c.  LHC 09/20/12:  oLM 30%, mid LAD occluded, prox D1 80%, mid CFX 100, OM1 30, prox RCA 100, S-RI, native RI 80% (stable), S-OM patent, native OM2 100 (filled via R collats) - culprit, L-LAD patent, S-RCA mid 50%. - med rx; e. 11/2012 NSTEMI->med rx.  Marland Kitchen Hx pulmonary embolism 2010    a. bilateral;  b. Chronic coumadin since 12/2008  . Stroke [facility]) 2010    [facility].  . Ischemic cardiomyopathy     a. 04/2012 Echo: EF 40-45%.;  b.  Echo 5/14: EF 60-65%, NL WM, Gr 1 DD  . Hypertension   .  Osteoarthritis     Low back and left knee and both hips  . CKD (chronic kidney disease)     left sided chronic high grade obstruction on NM renal imaging (09/2011)--9% function compared to 90% function on right side.  . Nephrolithiasis     with left ureterolithiasis; hx of ureteral stent on left--has resulted in markedly decreased function of left kidney.  . Diabetic retinopathy associated with type 2 diabetes mellitus ([place])   . Squamous cell carcinoma ([place]) 2008    on his back  . Insomnia   . History of pneumonia   . Diabetic peripheral neuropathy (HCC)     was on neurontin at one time   Past Surgical History  Procedure Laterality Date  . Coronary artery bypass graft  2000    4 vessel cabg in [place], [place].  . Cardiac catheterization  04/2012    patent grafts, severe three-vessel CAD, severe diffuse small vessel disease and residual CAD. He underwent angioplasty alone to a 99% ramus intermedius lesion with resultant dissection and 70% restenosis.  . Carotid endarterectomy  2000    bilateral  . Lithotripsy      (Dr. Jeffie Pollock)  . Cardiac catheterization  09/2012    Occlusion of the native OM 2 with right-to-left collateral filling, prior balloon angioplasty site unchanged, SVG-RCA was unchanged, mildly elevated LVEDP  . Colonoscopy  2004  Dr. Ysidro Evert in [place], [place].  . Transthoracic echocardiogram  01/2014    Mild LVH, normal LV systolic fxn ([facility])  . Carot dopp  01/2014    1-39% sten of ICA's bilat ([facility])  . Left heart catheterization with coronary/graft angiogram N/A 05/01/2012    Procedure: LEFT HEART CATHETERIZATION WITH Beatrix Fetters;  Surgeon: Sherren Mocha, MD;  Location: [facility] CATH LAB;  Service: Cardiovascular;  Laterality: N/A;  Possible angioplasty  . Left heart catheterization with coronary/graft angiogram N/A 09/20/2012    Procedure: LEFT HEART CATHETERIZATION WITH Beatrix Fetters;  Surgeon: Wellington Hampshire, MD;   Location: [facility] CATH LAB;  Service: Cardiovascular;  Laterality: N/A;   Family History  Problem Relation Age of Onset  . Stroke Mother     age 13  . Other Father     pulmonary embolus age 44  . Diabetes Sister   . Stroke Sister   . Diabetes Brother     brother was murdered  . Diabetes Sister    Social History  Substance Use Topics  . Smoking status: Never Smoker   . Smokeless tobacco: Never Used  . Alcohol Use: No    Review of Systems  Gastrointestinal: Negative for abdominal pain.  Genitourinary: Positive for frequency. Negative for difficulty urinating.  Musculoskeletal: Positive for arthralgias.  All other systems reviewed and are negative.     Allergies  Doxycycline; Morphine and related; Niacin and related; and Zoloft  Home Medications   Prior to Admission medications   Medication Sig Start Date End Date Taking? Authorizing Provider  atorvastatin (LIPITOR) 80 MG tablet TAKE 1 TABLET BY MOUTH EVERY DAY Patient taking differently: TAKE 80 MG BY MOUTH DAILY 09/30/14  Yes Darlin Coco, MD  carvedilol (COREG) 6.25 MG tablet TAKE 1 TABLET (6.25 MG TOTAL) BY MOUTH 2 (TWO) TIMES DAILY WITH A MEAL. Patient taking differently: TAKE 1 TABLET (6.25 MG TOTAL) BY MOUTH DAILY WITH A MEAL. 06/25/14  Yes Darlin Coco, MD  Cholecalciferol (VITAMIN D-3) 1000 UNITS CAPS Take 2,000 Units by mouth daily.   Yes Historical Provider, MD  citalopram (CELEXA) 20 MG tablet Take 20 mg by mouth daily.   Yes Historical Provider, MD  diphenhydramine-acetaminophen (TYLENOL PM) 25-500 MG TABS Take 2 tablets by mouth at bedtime.   Yes Historical Provider, MD  docusate sodium (COLACE) 100 MG capsule Take 100 mg by mouth daily.    Yes Historical Provider, MD  donepezil (ARICEPT) 10 MG tablet Take 10 mg by mouth at bedtime.   Yes Historical Provider, MD  folic acid (FOLVITE) 1 MG tablet Take 1 mg by mouth daily.   Yes Historical Provider, MD  hydrALAZINE (APRESOLINE) 25 MG tablet TAKE 1 TABLET (25  MG TOTAL) BY MOUTH 2 (TWO) TIMES DAILY. Patient taking differently: TAKE 1 TABLET (25 MG TOTAL) BY MOUTH DAILY. 06/25/14  Yes Darlin Coco, MD  insulin glargine (LANTUS) 100 UNIT/ML injection Inject 0.2 mLs (20 Units total) into the skin every morning. 12/04/14  Yes Irene Pap, NP  isosorbide mononitrate (IMDUR) 60 MG 24 hr tablet Take 1 tablet (60 mg total) by mouth daily. Patient taking differently: Take 60 mg by mouth at bedtime.  02/02/15  Yes Tammi Sou, MD  nitroGLYCERIN (NITROSTAT) 0.4 MG SL tablet Place 0.4 mg under the tongue every 5 (five) minutes x 3 doses as needed for chest pain. 05/07/12  Yes Rogelia Mire, NP  OMEGA-3 FATTY ACIDS PO Take 1,000 mg by mouth daily.   Yes Historical  Provider, MD  polyethylene glycol (MIRALAX / GLYCOLAX) packet Take 17 g by mouth daily. Patient taking differently: Take 17 g by mouth daily as needed for mild constipation or moderate constipation.  10/01/14  Yes Debby Freiberg, MD  warfarin (COUMADIN) 6 MG tablet Take 6 mg by mouth daily.   Yes Historical Provider, MD   BP 204/82 mmHg  Pulse 59  Temp(Src) 97.9 F (36.6 C) (Oral)  Resp 18  Ht 6\' 2"  (1.88 m)  Wt 95.255 kg  BMI 26.95 kg/m2  SpO2 98% Physical Exam  Constitutional: He is oriented to person, place, and time. He appears well-developed and well-nourished.  HENT:  Head: Normocephalic and atraumatic.  Eyes: Conjunctivae are normal.  Cardiovascular: Normal rate, regular rhythm and normal heart sounds.   Pulmonary/Chest: Effort normal and breath sounds normal.  Abdominal: Soft. Bowel sounds are normal.  Musculoskeletal: He exhibits tenderness.       Legs: Lymphadenopathy:    He has no cervical adenopathy.  Neurological: He is alert and oriented to person, place, and time.  Skin: Skin is warm and dry.  Psychiatric: He has a normal mood and affect.  Nursing note and vitals reviewed.   ED Course  Procedures (including critical care time) Labs Review Labs Reviewed - No  data to display  Imaging Review No results found. I have personally reviewed and evaluated these images and lab results as part of my medical decision-making.   EKG Interpretation None     9:07 PM Spoke with Dr. Sharol Given (orthopedics).  Requests admission to 5N at [facility]. Medicine admit to hospitalist service. MDM   Final diagnoses:  None    Comminuted fracture right femoral neck.    Etta Quill, NP 06/10/15 0003

## 2015-06-10 ENCOUNTER — Inpatient Hospital Stay (HOSPITAL_COMMUNITY): Payer: Medicare Other | Admitting: Anesthesiology

## 2015-06-10 ENCOUNTER — Encounter (HOSPITAL_COMMUNITY)
Admission: EM | Disposition: A | Discharge status: Skilled Nursing Facility | Payer: Self-pay | Source: Skilled Nursing Facility | Attending: Internal Medicine

## 2015-06-10 DIAGNOSIS — Z7901 Long term (current) use of anticoagulants: Secondary | ICD-10-CM

## 2015-06-10 DIAGNOSIS — I10 Essential (primary) hypertension: Secondary | ICD-10-CM

## 2015-06-10 DIAGNOSIS — E118 Type 2 diabetes mellitus with unspecified complications: Secondary | ICD-10-CM

## 2015-06-10 DIAGNOSIS — S72001A Fracture of unspecified part of neck of right femur, initial encounter for closed fracture: Secondary | ICD-10-CM

## 2015-06-10 DIAGNOSIS — Z5181 Encounter for therapeutic drug level monitoring: Secondary | ICD-10-CM

## 2015-06-10 HISTORY — PX: HIP ARTHROPLASTY: SHX981

## 2015-06-10 LAB — TYPE AND SCREEN
ABO/RH(D): B POS
ANTIBODY SCREEN: NEGATIVE

## 2015-06-10 LAB — GLUCOSE, CAPILLARY
GLUCOSE-CAPILLARY: 105 mg/dL — AB (ref 65–99)
GLUCOSE-CAPILLARY: 134 mg/dL — AB (ref 65–99)
GLUCOSE-CAPILLARY: 144 mg/dL — AB (ref 65–99)
GLUCOSE-CAPILLARY: 181 mg/dL — AB (ref 65–99)
Glucose-Capillary: 123 mg/dL — ABNORMAL HIGH (ref 65–99)
Glucose-Capillary: 92 mg/dL (ref 65–99)

## 2015-06-10 LAB — ABO/RH: ABO/RH(D): B POS

## 2015-06-10 SURGERY — HEMIARTHROPLASTY, HIP, DIRECT ANTERIOR APPROACH, FOR FRACTURE
Anesthesia: General | Site: Hip | Laterality: Right

## 2015-06-10 MED ORDER — ACETAMINOPHEN 500 MG PO TABS: 500.0000 mg | ORAL_TABLET | Freq: Four times a day (QID) | ORAL | Status: AC | PRN

## 2015-06-10 MED ORDER — ONDANSETRON HCL 4 MG/2ML IJ SOLN: 4.0000 mg | Freq: Four times a day (QID) | INTRAMUSCULAR | Status: DC | PRN

## 2015-06-10 MED ORDER — LIDOCAINE HCL (CARDIAC) 20 MG/ML IV SOLN
INTRAVENOUS | Status: DC | PRN
  Administered 2015-06-10: 80 mg via INTRAVENOUS

## 2015-06-10 MED ORDER — MENTHOL 3 MG MT LOZG: 1.0000 | LOZENGE | OROMUCOSAL | Status: DC | PRN

## 2015-06-10 MED ORDER — ASPIRIN EC 81 MG PO TBEC: 81.0000 mg | DELAYED_RELEASE_TABLET | Freq: Every day | ORAL | Status: DC

## 2015-06-10 MED ORDER — METHOCARBAMOL 1000 MG/10ML IJ SOLN
500.0000 mg | Freq: Four times a day (QID) | INTRAMUSCULAR | Status: DC | PRN
  Filled 2015-06-10: qty 5

## 2015-06-10 MED ORDER — ONDANSETRON HCL 4 MG/2ML IJ SOLN
INTRAMUSCULAR | Status: DC | PRN
  Administered 2015-06-10: 4 mg via INTRAVENOUS

## 2015-06-10 MED ORDER — SODIUM CHLORIDE 0.9 % IV SOLN
INTRAVENOUS | Status: DC
  Administered 2015-06-10: 23:00:00 via INTRAVENOUS

## 2015-06-10 MED ORDER — SODIUM CHLORIDE 0.9 % IV SOLN
INTRAVENOUS | Status: DC
  Administered 2015-06-10: 50 mL/h via INTRAVENOUS
  Administered 2015-06-10: 75 mL/h via INTRAVENOUS
  Administered 2015-06-12: 07:00:00 via INTRAVENOUS

## 2015-06-10 MED ORDER — CEFAZOLIN SODIUM-DEXTROSE 2-3 GM-% IV SOLR
2.0000 g | INTRAVENOUS | Status: AC
  Administered 2015-06-10: 2 g via INTRAVENOUS
  Filled 2015-06-10 (×2): qty 50

## 2015-06-10 MED ORDER — PROPOFOL 10 MG/ML IV BOLUS
INTRAVENOUS | Status: DC | PRN
  Administered 2015-06-10: 70 mg via INTRAVENOUS

## 2015-06-10 MED ORDER — ONDANSETRON HCL 4 MG/2ML IJ SOLN: 4.0000 mg | Freq: Once | INTRAMUSCULAR | Status: DC | PRN

## 2015-06-10 MED ORDER — PHENYLEPHRINE HCL 10 MG/ML IJ SOLN
INTRAMUSCULAR | Status: DC | PRN
  Administered 2015-06-10 (×2): 80 ug via INTRAVENOUS

## 2015-06-10 MED ORDER — SUCCINYLCHOLINE CHLORIDE 20 MG/ML IJ SOLN
INTRAMUSCULAR | Status: AC
  Filled 2015-06-10: qty 1

## 2015-06-10 MED ORDER — METOCLOPRAMIDE HCL 5 MG/ML IJ SOLN: 5.0000 mg | Freq: Three times a day (TID) | INTRAMUSCULAR | Status: DC | PRN

## 2015-06-10 MED ORDER — BISACODYL 10 MG RE SUPP: 10.0000 mg | Freq: Every day | RECTAL | Status: DC | PRN

## 2015-06-10 MED ORDER — GLYCOPYRROLATE 0.2 MG/ML IJ SOLN
INTRAMUSCULAR | Status: DC | PRN
  Administered 2015-06-10: 0.4 mg via INTRAVENOUS
  Administered 2015-06-10: 0.2 mg via INTRAVENOUS

## 2015-06-10 MED ORDER — PROPOFOL 10 MG/ML IV BOLUS
INTRAVENOUS | Status: AC
  Filled 2015-06-10: qty 20

## 2015-06-10 MED ORDER — SODIUM CHLORIDE 0.9 % IR SOLN
Status: DC | PRN
  Administered 2015-06-10: 1000 mL

## 2015-06-10 MED ORDER — SODIUM CHLORIDE 0.45 % IV SOLN
INTRAVENOUS | Status: DC
  Administered 2015-06-10: 75 mL/h via INTRAVENOUS

## 2015-06-10 MED ORDER — ONDANSETRON HCL 4 MG PO TABS: 4.0000 mg | ORAL_TABLET | Freq: Four times a day (QID) | ORAL | Status: DC | PRN

## 2015-06-10 MED ORDER — FENTANYL CITRATE (PF) 250 MCG/5ML IJ SOLN
INTRAMUSCULAR | Status: AC
  Filled 2015-06-10: qty 5

## 2015-06-10 MED ORDER — POLYETHYLENE GLYCOL 3350 17 G PO PACK: 17.0000 g | PACK | Freq: Every day | ORAL | Status: DC | PRN

## 2015-06-10 MED ORDER — DOCUSATE SODIUM 100 MG PO CAPS
100.0000 mg | ORAL_CAPSULE | Freq: Two times a day (BID) | ORAL | Status: DC
  Administered 2015-06-10 – 2015-06-12 (×4): 100 mg via ORAL
  Filled 2015-06-10 (×4): qty 1

## 2015-06-10 MED ORDER — ROCURONIUM BROMIDE 50 MG/5ML IV SOLN
INTRAVENOUS | Status: AC
  Filled 2015-06-10: qty 1

## 2015-06-10 MED ORDER — METOCLOPRAMIDE HCL 5 MG PO TABS: 5.0000 mg | ORAL_TABLET | Freq: Three times a day (TID) | ORAL | Status: DC | PRN

## 2015-06-10 MED ORDER — PHENYLEPHRINE 40 MCG/ML (10ML) SYRINGE FOR IV PUSH (FOR BLOOD PRESSURE SUPPORT)
PREFILLED_SYRINGE | INTRAVENOUS | Status: AC
  Filled 2015-06-10: qty 10

## 2015-06-10 MED ORDER — GLUCERNA SHAKE PO LIQD
237.0000 mL | Freq: Two times a day (BID) | ORAL | Status: DC
  Administered 2015-06-11 – 2015-06-12 (×3): 237 mL via ORAL

## 2015-06-10 MED ORDER — METHOCARBAMOL 500 MG PO TABS
500.0000 mg | ORAL_TABLET | Freq: Four times a day (QID) | ORAL | Status: DC | PRN
  Administered 2015-06-12: 500 mg via ORAL
  Filled 2015-06-10: qty 1

## 2015-06-10 MED ORDER — SODIUM CHLORIDE 0.9 % IV SOLN
INTRAVENOUS | Status: DC | PRN
  Administered 2015-06-10 (×2): via INTRAVENOUS

## 2015-06-10 MED ORDER — ACETAMINOPHEN 325 MG PO TABS
650.0000 mg | ORAL_TABLET | Freq: Four times a day (QID) | ORAL | Status: DC | PRN
  Administered 2015-06-11 – 2015-06-12 (×2): 650 mg via ORAL
  Filled 2015-06-10 (×2): qty 2

## 2015-06-10 MED ORDER — PHENOL 1.4 % MT LIQD: 1.0000 | OROMUCOSAL | Status: DC | PRN

## 2015-06-10 MED ORDER — ACETAMINOPHEN 650 MG RE SUPP: 650.0000 mg | Freq: Four times a day (QID) | RECTAL | Status: DC | PRN

## 2015-06-10 MED ORDER — FENTANYL CITRATE (PF) 100 MCG/2ML IJ SOLN: 25.0000 ug | INTRAMUSCULAR | Status: DC | PRN

## 2015-06-10 MED ORDER — FENTANYL CITRATE (PF) 100 MCG/2ML IJ SOLN
INTRAMUSCULAR | Status: DC | PRN
  Administered 2015-06-10 (×2): 50 ug via INTRAVENOUS

## 2015-06-10 MED ORDER — HYDROCODONE-ACETAMINOPHEN 5-325 MG PO TABS: 1.0000 | ORAL_TABLET | Freq: Four times a day (QID) | ORAL | Status: DC | PRN

## 2015-06-10 MED ORDER — ROCURONIUM BROMIDE 100 MG/10ML IV SOLN
INTRAVENOUS | Status: DC | PRN
  Administered 2015-06-10: 50 mg via INTRAVENOUS

## 2015-06-10 MED ORDER — NEOSTIGMINE METHYLSULFATE 10 MG/10ML IV SOLN
INTRAVENOUS | Status: DC | PRN
  Administered 2015-06-10: 3 mg via INTRAVENOUS

## 2015-06-10 MED ORDER — WARFARIN - PHARMACIST DOSING INPATIENT
Freq: Every day | Status: DC
  Administered 2015-06-11: 18:00:00

## 2015-06-10 MED ORDER — LIDOCAINE HCL (CARDIAC) 20 MG/ML IV SOLN
INTRAVENOUS | Status: AC
  Filled 2015-06-10: qty 5

## 2015-06-10 MED ORDER — GLYCOPYRROLATE 0.2 MG/ML IJ SOLN
INTRAMUSCULAR | Status: AC
  Filled 2015-06-10: qty 1

## 2015-06-10 MED ORDER — CEFAZOLIN SODIUM-DEXTROSE 2-3 GM-% IV SOLR
2.0000 g | Freq: Four times a day (QID) | INTRAVENOUS | Status: AC
  Administered 2015-06-10 – 2015-06-11 (×2): 2 g via INTRAVENOUS
  Filled 2015-06-10 (×2): qty 50

## 2015-06-10 MED ORDER — WARFARIN SODIUM 6 MG PO TABS
6.0000 mg | ORAL_TABLET | Freq: Once | ORAL | Status: AC
  Administered 2015-06-10: 6 mg via ORAL
  Filled 2015-06-10: qty 1

## 2015-06-10 SURGICAL SUPPLY — 44 items
BLADE SAW SAG 73X25 THK (BLADE) ×2
BLADE SAW SGTL 73X25 THK (BLADE) ×1 IMPLANT
BRUSH FEMORAL CANAL (MISCELLANEOUS) IMPLANT
CAPT HIP HEMI 1 ×2 IMPLANT
COVER SURGICAL LIGHT HANDLE (MISCELLANEOUS) ×3 IMPLANT
DRAPE IMP U-DRAPE 54X76 (DRAPES) ×3 IMPLANT
DRAPE INCISE IOBAN 85X60 (DRAPES) ×2 IMPLANT
DRAPE ORTHO SPLIT 77X108 STRL (DRAPES) ×6
DRAPE SURG ORHT 6 SPLT 77X108 (DRAPES) ×2 IMPLANT
DRAPE U-SHAPE 47X51 STRL (DRAPES) ×3 IMPLANT
DRSG MEPILEX BORDER 4X12 (GAUZE/BANDAGES/DRESSINGS) ×3 IMPLANT
DRSG MEPILEX BORDER 4X8 (GAUZE/BANDAGES/DRESSINGS) IMPLANT
DURAPREP 26ML APPLICATOR (WOUND CARE) ×5 IMPLANT
ELECT BLADE 6.5 EXT (BLADE) IMPLANT
ELECT CAUTERY BLADE 6.4 (BLADE) IMPLANT
ELECT REM PT RETURN 9FT ADLT (ELECTROSURGICAL) ×3
ELECTRODE REM PT RTRN 9FT ADLT (ELECTROSURGICAL) ×1 IMPLANT
GAUZE SPONGE 4X4 12PLY STRL (GAUZE/BANDAGES/DRESSINGS) IMPLANT
GLOVE BIOGEL PI IND STRL 9 (GLOVE) ×1 IMPLANT
GLOVE BIOGEL PI INDICATOR 9 (GLOVE) ×2
GLOVE SURG ORTHO 9.0 STRL STRW (GLOVE) ×6 IMPLANT
GOWN STRL REUS W/ TWL XL LVL3 (GOWN DISPOSABLE) ×1 IMPLANT
GOWN STRL REUS W/TWL XL LVL3 (GOWN DISPOSABLE) ×3
HANDPIECE INTERPULSE COAX TIP (DISPOSABLE) ×3
KIT BASIN OR (CUSTOM PROCEDURE TRAY) ×3 IMPLANT
KIT ROOM TURNOVER OR (KITS) ×3 IMPLANT
MANIFOLD NEPTUNE II (INSTRUMENTS) ×3 IMPLANT
NS IRRIG 1000ML POUR BTL (IV SOLUTION) ×3 IMPLANT
PACK TOTAL JOINT (CUSTOM PROCEDURE TRAY) ×3 IMPLANT
PACK UNIVERSAL I (CUSTOM PROCEDURE TRAY) ×3 IMPLANT
PAD ARMBOARD 7.5X6 YLW CONV (MISCELLANEOUS) ×6 IMPLANT
SET HNDPC FAN SPRY TIP SCT (DISPOSABLE) IMPLANT
STAPLER VISISTAT 35W (STAPLE) ×3 IMPLANT
SUT ETHIBOND NAB CT1 #1 30IN (SUTURE) ×3 IMPLANT
SUT VIC AB 0 CT1 27 (SUTURE) ×3
SUT VIC AB 0 CT1 27XBRD ANBCTR (SUTURE) IMPLANT
SUT VIC AB 1 CT1 27 (SUTURE) ×3
SUT VIC AB 1 CT1 27XBRD ANBCTR (SUTURE) ×1 IMPLANT
SUT VIC AB 2-0 CTB1 (SUTURE) ×3 IMPLANT
TOWEL OR 17X24 6PK STRL BLUE (TOWEL DISPOSABLE) ×3 IMPLANT
TOWEL OR 17X26 10 PK STRL BLUE (TOWEL DISPOSABLE) ×3 IMPLANT
TOWER CARTRIDGE SMART MIX (DISPOSABLE) IMPLANT
TRAY FOLEY CATH 16FRSI W/METER (SET/KITS/TRAYS/PACK) IMPLANT
WATER STERILE IRR 1000ML POUR (IV SOLUTION) ×3 IMPLANT

## 2015-06-10 NOTE — Op Note (Signed)
06/09/2015 - 06/10/2015  6:23 PM  PATIENT:  Julius Bowels    PRE-OPERATIVE DIAGNOSIS:  Right Femoral Neck Fracture  POST-OPERATIVE DIAGNOSIS:  Same  PROCEDURE:  RIGHT HIP HEMIARTHROPLASTY  SURGEON:  Newt Minion, MD  PHYSICIAN ASSISTANT:None ANESTHESIA:   General  PREOPERATIVE INDICATIONS:  LUISFERNANDO REMACHE is a  82 y.o. male with a diagnosis of Right Femoral Neck Fracture who failed conservative measures and elected for surgical management.    The risks benefits and alternatives were discussed with the patient preoperatively including but not limited to the risks of infection, bleeding, nerve injury, cardiopulmonary complications, the need for revision surgery, among others, and the patient was willing to proceed.  OPERATIVE IMPLANTS: [place] size 6 femoral stem size 52 ball +0 neck  OPERATIVE FINDINGS: Comminuted fracture that extended down through the neck  OPERATIVE PROCEDURE: Patient brought to the operating room and underwent a general anesthetic. After adequate levels anesthesia obtained patient was placed in the left lateral decubitus position with the right side up and the right lower extremity was prepped using DuraPrep draped into a sterile field. A timeout was called. A posterior lateral incision was made this was carried down through the tensor fascia lata which was split. The piriformis short external rotators and capsule was incised longitudinally off the neck. The neck cut was made 1 cm proximal to the calcar. The head was delivered this sized 50-52 had a good suction fit. The wound was irrigated with pulsatile lavage. The femur was then sequentially broached up to a size 6. The 6 femoral stem was placed at 20 of anteversion the 52 mm head with +0 neck was inserted this was reduced patient had good stability with full range of motion. The wound again was irrigated with pulsatile lavage. The capsule performance and short external rotators were reapproximated with #1 Ethibond. The  tensor fascia lata was closed using #1 Vicryl. Subcutaneous is closed using 0 Vicryl. The skin was closed using staples. A Mepilex dressing was applied. Patient was extubated taken to the PACU in stable condition.

## 2015-06-10 NOTE — Progress Notes (Signed)
Initial Nutrition Assessment  DOCUMENTATION CODES:   Not applicable  INTERVENTION:  Once diet advances, provide Glucerna Shake po BID, each supplement provides 220 kcal and 10 grams of protein.  NUTRITION DIAGNOSIS:   Increased nutrient needs related to  (surgery) as evidenced by estimated needs.  GOAL:   Patient will meet greater than or equal to 90% of their needs  MONITOR:   Diet advancement, Supplement acceptance, Weight trends, Labs, I & O's  REASON FOR ASSESSMENT:   Consult Hip fracture protocol  ASSESSMENT:   82 year old gentleman status post myocardial infarction with diabetes who is most recently status post strokes which involved both lower extremities patient states he has weakness in both lower extremities he can't walk he states he bent over to pick something up and fell sustaining a right femoral neck fracture.  Pt is currently NPO for surgery today. Pt reports eating well PTA with consumption of at least 3 meals a day with no other difficulties. Pt with no weight loss. Pt is agreeable to nutritional supplements once diet advances to aid in healing. RD to order.   Limited Nutrition-Focused physical exam completed. Findings are no fat depletion, mild muscle depletion, and no edema. Of note, lower extremities were not observed as pt reported to be in pain.   Labs and medications reviewed.   Diet Order:  Diet NPO time specified  Skin:  Reviewed, no issues  Last BM:  12/29  Height:   Ht Readings from Last 1 Encounters:  06/09/15 6\' 2"  (1.88 m)    Weight:   Wt Readings from Last 1 Encounters:  06/09/15 210 lb (95.255 kg)    Ideal Body Weight:  86.36 kg  BMI:  Body mass index is 26.95 kg/(m^2).  Estimated Nutritional Needs:   Kcal:  2000-2200  Protein:  100-110 grams  Fluid:  2- 2.2 L/day  EDUCATION NEEDS:   No education needs identified at this time  Corrin Parker, MS, RD, LDN Pager # [phone] After hours/ weekend pager # [phone]

## 2015-06-10 NOTE — Anesthesia Procedure Notes (Signed)
Procedure Name: Intubation Date/Time: 06/10/2015 5:37 PM Performed by: Manus Gunning, Adrianah Prophete J Pre-anesthesia Checklist: Patient identified, Timeout performed, Emergency Drugs available, Suction available and Patient being monitored Patient Re-evaluated:Patient Re-evaluated prior to inductionOxygen Delivery Method: Circle system utilized Preoxygenation: Pre-oxygenation with 100% oxygen Intubation Type: IV induction Ventilation: Mask ventilation without difficulty Laryngoscope Size: Mac and 4 Grade View: Grade I Tube type: Oral Tube size: 7.5 mm Number of attempts: 1 Placement Confirmation: ETT inserted through vocal cords under direct vision,  breath sounds checked- equal and bilateral and positive ETCO2 Secured at: 21 cm Tube secured with: Tape Dental Injury: Teeth and Oropharynx as per pre-operative assessment

## 2015-06-10 NOTE — Anesthesia Preprocedure Evaluation (Addendum)
Anesthesia Evaluation  Patient identified by MRN, date of birth, ID band Patient awake    Reviewed: Allergy & Precautions, H&P , NPO status , Patient's Chart, lab work & pertinent test results  History of Anesthesia Complications Negative for: history of anesthetic complications  Airway Mallampati: III  TM Distance: >3 FB Neck ROM: full    Dental no notable dental hx. (+) Edentulous Upper, Edentulous Lower   Pulmonary neg pulmonary ROS,    Pulmonary exam normal breath sounds clear to auscultation       Cardiovascular hypertension, On Medications + CAD, + Past MI, + CABG (CABG in 2000) and + Peripheral Vascular Disease  Normal cardiovascular exam Rhythm:regular Rate:Normal + Systolic murmurs Echo 99991111 - EF preserved at 60%, no wall motion abnormalities, grade 1 diastolic dysfunction   Neuro/Psych PSYCHIATRIC DISORDERS CVA    GI/Hepatic negative GI ROS, Neg liver ROS,   Endo/Other  diabetes, Poorly Controlled, Type 2, Insulin Dependent  Renal/GU Renal diseaseLeft kidney functions at 9%, Cr is 1.3-1.5 baseline     Musculoskeletal  (+) Arthritis , Osteoarthritis,    Abdominal   Peds  Hematology History of bilateral PE in 2010 managed on coumadin since then   Anesthesia Other Findings INR was >2 on admission, patient on coumadin  Reproductive/Obstetrics negative OB ROS                          Anesthesia Physical Anesthesia Plan  ASA: III  Anesthesia Plan: General   Post-op Pain Management:    Induction: Intravenous  Airway Management Planned: Oral ETT  Additional Equipment:   Intra-op Plan:   Post-operative Plan: Extubation in OR  Informed Consent: I have reviewed the patients History and Physical, chart, labs and discussed the procedure including the risks, benefits and alternatives for the proposed anesthesia with the patient or authorized representative who has indicated his/her  understanding and acceptance.   Dental Advisory Given  Plan Discussed with: Anesthesiologist and CRNA  Anesthesia Plan Comments:         Anesthesia Quick Evaluation

## 2015-06-10 NOTE — Discharge Instructions (Signed)

## 2015-06-10 NOTE — Progress Notes (Signed)
ANTICOAGULATION CONSULT NOTE - Initial Consult  Pharmacy Consult for warfarin Indication: VTE prophylaxis, history of PE/DVT  Allergies  Allergen Reactions  . Doxycycline Swelling    swelling  . Morphine And Related Nausea Only    NAUSEA    . Niacin And Related Other (See Comments)    Hot flashes  . Zoloft [Sertraline Hcl]     Listed on [facility]    Patient Measurements: Height: 6\' 2"  (188 cm) Weight: 210 lb (95.255 kg) IBW/kg (Calculated) : 82.2   Vital Signs: Temp: 98.4 F (36.9 C) (01/04 2002) Temp Source: Oral (01/04 2002) BP: 153/62 mmHg (01/04 2002) Pulse Rate: 68 (01/04 2002)  Labs:  Recent Labs  06/09/15 2004 06/09/15 2032  HGB 13.3  --   HCT 41.1  --   PLT 136*  --   LABPROT  --  24.5*  INR  --  2.23*  CREATININE 1.40*  --     Estimated Creatinine Clearance: 48.1 mL/min (by C-G formula based on Cr of 1.4).   Medical History: Past Medical History  Diagnosis Date  . Diabetes mellitus Onset approx 1980  . Hyperlipidemia   . Carotid artery occlusion   . Calf DVT (deep venous thrombosis) ([place]) 2010    a. hx of right calf - chronic coumadin since 12/2008.  Recurrence shortly after d/c of coumadin 03/2013--coumadin restarted.  Marland Kitchen PVD (peripheral vascular disease) ([place])     ?Tibial dz? Re-eval 01/29/13 by Vascular MD concluded that no significant evidence of PAD was present.  . Coronary artery disease     a. s/p MI & CABG in 2000;  b. 04/2012 Cath/PTCA:  VG-> RI patent w/ 99 in native RI (PTCA only );  c.  LHC 09/20/12:  oLM 30%, mid LAD occluded, prox D1 80%, mid CFX 100, OM1 30, prox RCA 100, S-RI, native RI 80% (stable), S-OM patent, native OM2 100 (filled via R collats) - culprit, L-LAD patent, S-RCA mid 50%. - med rx; e. 11/2012 NSTEMI->med rx.  Marland Kitchen Hx pulmonary embolism 2010    a. bilateral;  b. Chronic coumadin since 12/2008  . Stroke [facility]) 2010    [facility].  . Ischemic cardiomyopathy     a. 04/2012 Echo: EF 40-45%.;  b.  Echo 5/14: EF  60-65%, NL WM, Gr 1 DD  . Hypertension   . Osteoarthritis     Low back and left knee and both hips  . CKD (chronic kidney disease)     left sided chronic high grade obstruction on NM renal imaging (09/2011)--9% function compared to 90% function on right side.  . Nephrolithiasis     with left ureterolithiasis; hx of ureteral stent on left--has resulted in markedly decreased function of left kidney.  . Diabetic retinopathy associated with type 2 diabetes mellitus ([place])   . Squamous cell carcinoma ([place]) 2008    on his back  . Insomnia   . History of pneumonia   . Diabetic peripheral neuropathy (HCC)     was on neurontin at one time   Assessment: 82 y.o. male who suffered a mechanical fall at nursing home. Now s/p right hip hemiarthroplasty. He was on warfarin pta for history of PE/DVTs.  INR 2.2 yesterday on admit, not checked this am.   Per [facility] MAG he takes 6mg  of warfarin daily  Goal of Therapy:  INR 2-3 Monitor platelets by anticoagulation protocol: Yes   Plan:  Restart warfarin 6mg  tonight Daily INR for now  Erin Hearing PharmD., BCPS Clinical Pharmacist  Pager [phone] 06/10/2015 8:45 PM

## 2015-06-10 NOTE — Consult Note (Signed)
Reason for Consult: Right femoral neck fracture Referring Physician: Dr. Patriciaann Clan is an 82 y.o. male.  HPI: Patient is an 82 year old gentleman status post myocardial infarction with diabetes who is most recently status post strokes which involved both lower extremities patient states he has weakness in both lower extremities he can't walk he states he bent over to pick something up and fell sustaining a right femoral neck fracture.  Past Medical History  Diagnosis Date  . Diabetes mellitus Onset approx 1980  . Hyperlipidemia   . Carotid artery occlusion   . Calf DVT (deep venous thrombosis) ([place]) 2010    a. hx of right calf - chronic coumadin since 12/2008.  Recurrence shortly after d/c of coumadin 03/2013--coumadin restarted.  Marland Kitchen PVD (peripheral vascular disease) ([facility])     ?Tibial dz? Re-eval 01/29/13 by Vascular MD concluded that no significant evidence of PAD was present.  . Coronary artery disease     a. s/p MI & CABG in 2000;  b. 04/2012 Cath/PTCA:  VG-> RI patent w/ 99 in native RI (PTCA only );  c.  LHC 09/20/12:  oLM 30%, mid LAD occluded, prox D1 80%, mid CFX 100, OM1 30, prox RCA 100, S-RI, native RI 80% (stable), S-OM patent, native OM2 100 (filled via R collats) - culprit, L-LAD patent, S-RCA mid 50%. - med rx; e. 11/2012 NSTEMI->med rx.  Marland Kitchen Hx pulmonary embolism 2010    a. bilateral;  b. Chronic coumadin since 12/2008  . Stroke [facility]) 2010    [facility].  . Ischemic cardiomyopathy     a. 04/2012 Echo: EF 40-45%.;  b.  Echo 5/14: EF 60-65%, NL WM, Gr 1 DD  . Hypertension   . Osteoarthritis     Low back and left knee and both hips  . CKD (chronic kidney disease)     left sided chronic high grade obstruction on NM renal imaging (09/2011)--9% function compared to 90% function on right side.  . Nephrolithiasis     with left ureterolithiasis; hx of ureteral stent on left--has resulted in markedly decreased function of left kidney.  . Diabetic retinopathy  associated with type 2 diabetes mellitus ([place])   . Squamous cell carcinoma ([place]) 2008    on his back  . Insomnia   . History of pneumonia   . Diabetic peripheral neuropathy (HCC)     was on neurontin at one time    Past Surgical History  Procedure Laterality Date  . Coronary artery bypass graft  2000    4 vessel cabg in [place], [place].  . Cardiac catheterization  04/2012    patent grafts, severe three-vessel CAD, severe diffuse small vessel disease and residual CAD. He underwent angioplasty alone to a 99% ramus intermedius lesion with resultant dissection and 70% restenosis.  . Carotid endarterectomy  2000    bilateral  . Lithotripsy      (Dr. Jeffie Pollock)  . Cardiac catheterization  09/2012    Occlusion of the native OM 2 with right-to-left collateral filling, prior balloon angioplasty site unchanged, SVG-RCA was unchanged, mildly elevated LVEDP  . Colonoscopy  2004    Dr. Ysidro Evert in [place], [place].  . Transthoracic echocardiogram  01/2014    Mild LVH, normal LV systolic fxn ([facility])  . Carot dopp  01/2014    1-39% sten of ICA's bilat ([facility])  . Left heart catheterization with coronary/graft angiogram N/A 05/01/2012    Procedure: LEFT HEART CATHETERIZATION WITH CORONARY/GRAFT ANGIOGRAM;  Surgeon:  Sherren Mocha, MD;  Location: [facility] CATH LAB;  Service: Cardiovascular;  Laterality: N/A;  Possible angioplasty  . Left heart catheterization with coronary/graft angiogram N/A 09/20/2012    Procedure: LEFT HEART CATHETERIZATION WITH Beatrix Fetters;  Surgeon: Wellington Hampshire, MD;  Location[facility] CATH LAB;  Service: Cardiovascular;  Laterality: N/A;    Family History  Problem Relation Age of Onset  . Stroke Mother     age 52  . Other Father     pulmonary embolus age 84  . Diabetes Sister   . Stroke Sister   . Diabetes Brother     brother was murdered  . Diabetes Sister     Social History:  reports that he has never smoked. He has never used smokeless  tobacco. He reports that he does not drink alcohol or use illicit drugs.  Allergies:  Allergies  Allergen Reactions  . Doxycycline Swelling    swelling  . Morphine And Related Nausea Only    NAUSEA    . Niacin And Related Other (See Comments)    Hot flashes  . Zoloft [Sertraline Hcl]     Listed on MAR    Medications: I have reviewed the patient's current medications.  Results for orders placed or performed during the hospital encounter of 06/09/15 (from the past 48 hour(s))  Urinalysis, Routine w reflex microscopic (not at [facility])     Status: Abnormal   Collection Time: 06/09/15  7:22 PM  Result Value Ref Range   Color, Urine YELLOW YELLOW   APPearance CLEAR CLEAR   Specific Gravity, Urine 1.013 1.005 - 1.030   pH 7.0 5.0 - 8.0   Glucose, UA NEGATIVE NEGATIVE mg/dL   Hgb urine dipstick TRACE (A) NEGATIVE   Bilirubin Urine NEGATIVE NEGATIVE   Ketones, ur NEGATIVE NEGATIVE mg/dL   Protein, ur 30 (A) NEGATIVE mg/dL   Nitrite NEGATIVE NEGATIVE   Leukocytes, UA NEGATIVE NEGATIVE  Urine microscopic-add on     Status: Abnormal   Collection Time: 06/09/15  7:22 PM  Result Value Ref Range   Squamous Epithelial / LPF 0-5 (A) NONE SEEN   WBC, UA NONE SEEN 0 - 5 WBC/hpf   RBC / HPF 0-5 0 - 5 RBC/hpf   Bacteria, UA RARE (A) NONE SEEN  CBC with Differential/Platelet     Status: Abnormal   Collection Time: 06/09/15  8:04 PM  Result Value Ref Range   WBC 14.3 (H) 4.0 - 10.5 K/uL   RBC 4.40 4.22 - 5.81 MIL/uL   Hemoglobin 13.3 13.0 - 17.0 g/dL   HCT 41.1 39.0 - 52.0 %   MCV 93.4 78.0 - 100.0 fL   MCH 30.2 26.0 - 34.0 pg   MCHC 32.4 30.0 - 36.0 g/dL   RDW 13.8 11.5 - 15.5 %   Platelets 136 (L) 150 - 400 K/uL   Neutrophils Relative % 83 %   Neutro Abs 11.8 (H) 1.7 - 7.7 K/uL   Lymphocytes Relative 11 %   Lymphs Abs 1.6 0.7 - 4.0 K/uL   Monocytes Relative 5 %   Monocytes Absolute 0.8 0.1 - 1.0 K/uL   Eosinophils Relative 1 %   Eosinophils Absolute 0.2 0.0 - 0.7 K/uL   Basophils  Relative 0 %   Basophils Absolute 0.0 0.0 - 0.1 K/uL  Basic metabolic panel     Status: Abnormal   Collection Time: 06/09/15  8:04 PM  Result Value Ref Range   Sodium 139 135 - 145 mmol/L   Potassium 4.0 3.5 -  5.1 mmol/L   Chloride 105 101 - 111 mmol/L   CO2 26 22 - 32 mmol/L   Glucose, Bld 113 (H) 65 - 99 mg/dL   BUN 28 (H) 6 - 20 mg/dL   Creatinine, Ser 1.40 (H) 0.61 - 1.24 mg/dL   Calcium 8.9 8.9 - 10.3 mg/dL   GFR calc non Af Amer 46 (L) >60 mL/min   GFR calc Af Amer 53 (L) >60 mL/min    Comment: (NOTE) The eGFR has been calculated using the CKD EPI equation. This calculation has not been validated in all clinical situations. eGFR's persistently <60 mL/min signify possible Chronic Kidney Disease.    Anion gap 8 5 - 15  Protime-INR     Status: Abnormal   Collection Time: 06/09/15  8:32 PM  Result Value Ref Range   Prothrombin Time 24.5 (H) 11.6 - 15.2 seconds   INR 2.23 (H) 0.00 - 1.49  CBG monitoring, ED     Status: Abnormal   Collection Time: 06/09/15 10:04 PM  Result Value Ref Range   Glucose-Capillary 119 (H) 65 - 99 mg/dL  Glucose, capillary     Status: Abnormal   Collection Time: 06/09/15 11:39 PM  Result Value Ref Range   Glucose-Capillary 181 (H) 65 - 99 mg/dL  Glucose, capillary     Status: Abnormal   Collection Time: 06/10/15  4:13 AM  Result Value Ref Range   Glucose-Capillary 144 (H) 65 - 99 mg/dL    Dg Chest 1 View  06/09/2015  CLINICAL DATA:  RIGHT hip pain EXAM: CHEST 1 VIEW COMPARISON:  03/04/2015, CT abdomen 10/01/2014 FINDINGS: Sternotomy wires overlie normal cardiac silhouette. Nodular density at the LEFT lung base which corresponds to pleural-parenchymal scarring and thickening on comparison CT of 10/01/2014. Chronic bronchitic markings centrally. No pulmonary edema or pleural fluid. No pneumothorax. Atherosclerotic calcification of the abdominal aorta. IMPRESSION: 1. No acute cardiopulmonary process. 2. Chronic parenchymal nodular scarring at the LEFT  lung base. 3. Chronic bronchitic markings. 4. Atherosclerotic calcification aorta. Electronically Signed   By: Suzy Bouchard M.D.   On: 06/09/2015 18:58   Dg Hip Unilat With Pelvis 2-3 Views Right  06/09/2015  CLINICAL DATA:  Pt states he was reaching for something and fell. Pt complains of right hip pain. EXAM: DG HIP (WITH OR WITHOUT PELVIS) 2-3V RIGHT COMPARISON:  CT 10/01/2014 FINDINGS: There is a comminuted fracture involving the base of the right femoral neck. No evidence for dislocation. There are degenerative changes involving both hips and lower lumbar spine. There is atherosclerotic calcification of vessels. Pelvis is intact. IMPRESSION: Comminuted fracture of the right femoral neck. Electronically Signed   By: Nolon Nations M.D.   On: 06/09/2015 18:54    ROS Blood pressure 109/57, pulse 63, temperature 98.4 F (36.9 C), temperature source Oral, resp. rate 16, height '6\' 2"'$  (1.88 m), weight 95.255 kg (210 lb), SpO2 95 %. Physical Exam  On examination patient's right lower extremity is shortened Rotated. Radiographs shows a displaced femoral neck fracture. Patient has weakness in both lower extremities. Assessment/Plan: Assessment: Diabetic insensate neuropathy status post MI status post stroke involving both lower extremities with a right femoral neck fracture.  Plan: We'll plan for right hip hemiarthroplasty risks and benefits were discussed patient states he understands plan for surgery as add-on today. His daughter is coming in from out of town and they will call me with any questions today.  DUDA,MARCUS V 06/10/2015, 7:10 AM

## 2015-06-10 NOTE — Progress Notes (Signed)
[facility] PROGRESS NOTE  COLE FURRH [id number] DOB: July 03, 1931 DOA: 06/09/2015 PCP: Tammi Sou, MD  Assessment/Plan: 1. Comminuted fracture right femoral neck. -Mr. Cuebas is a nursing home resident, having a fall at his facility. He is currently nonambulatory, leaned forward to pick something up and fell to the floor afterwards she experienced significant right hip fracture. -Imaging studies performed in the emergency department revealed a comminuted fracture of right femoral neck. -He was seen and evaluated by Dr.Dude or orthopedic surgery -Having multiple comorbidities, advanced age, poor functional status at baseline he is likely to have at least an intermediate surgical risk. He does not appear to have acute cardiopulmonary issues at this time.  -Plan for patient to undergo right hip hemiarthroplasty  2.   History of coronary artery disease. -Patient having history of cardiac catheterization 2 years ago, did not have targets for revascularization and managed medically -He currently denies chest pain or shortness of breath. -Continue statin, beta blocker and nitrate.  3.  Stage III chronic kidney disease. -Having a baseline creatinine between 1.3 and 1.5 -A.m. lab work showing creatinine 1.4, will continue to monitor kidney function.  4.  History of DVT -He is on anticoagulant with warfarin. -Labs showing an INR of 2.23. Warfarin was discontinued for anticipated procedure.  5.  Hypertension. -Initially was hypertensive with systolic blood pressures in the 200s, suspect related to pain from hip fracture -Blood pressures improving this morning. -Continue him and/or 60 mg by mouth daily and hydralazine 25 mg by mouth twice a day.  6.  Type 2 diabetes mellitus. -Blood sugars controlled, continue Lantus 10 units subcutaneous daily, sliding scale coverage with Accu-Cheks.  Code Status: Full Code Family Communication: I spoke to his daughter Dmir Rief over  telephone conversation  Disposition Plan:    Consultants:  Orthpedic Surgery  Procedures:   HPI/Subjective: Mr. Katzman is a pleasant 82 year old gentleman with multiple comorbidities including diabetes mellitus, hypertension, dyslipidemia, history of DVT, CVA, currently a nursing home resident who was admitted to the medicine service on 06/08/2014 presented after having a fall at his facility. Imaging revealed the presence of a comminuted fracture of the right femoral neck. Orthopedic surgery was consulted.  Objective: Filed Vitals:   06/09/15 2313 06/10/15 0415  BP: 141/63 109/57  Pulse: 72 63  Temp: 97.7 F (36.5 C) 98.4 F (36.9 C)  Resp: 18 16    Intake/Output Summary (Last 24 hours) at 06/10/15 1245 Last data filed at 06/10/15 0306  Gross per 24 hour  Intake      0 ml  Output     50 ml  Net    -50 ml   Filed Weights   06/09/15 1813  Weight: 95.255 kg (210 lb)    Exam:   General:  Patient is confused, disoriented, although no acute distress. He is hard of hearing.  Cardiovascular: Regular rate and rhythm normal S1-S2  Respiratory: Normal respiratory effort  Abdomen: Soft nontender nondistended  Musculoskeletal: No edema, right lower extremity externally rotated  Data Reviewed: Basic Metabolic Panel:  Recent Labs Lab 06/09/15 2004  NA 139  K 4.0  CL 105  CO2 26  GLUCOSE 113*  BUN 28*  CREATININE 1.40*  CALCIUM 8.9   Liver Function Tests: No results for input(s): AST, ALT, ALKPHOS, BILITOT, PROT, ALBUMIN in the last 168 hours. No results for input(s): LIPASE, AMYLASE in the last 168 hours. No results for input(s): AMMONIA in the last 168 hours. CBC:  Recent Labs Lab 06/09/15 2004  WBC 14.3*  NEUTROABS 11.8*  HGB 13.3  HCT 41.1  MCV 93.4  PLT 136*   Cardiac Enzymes: No results for input(s): CKTOTAL, CKMB, CKMBINDEX, TROPONINI in the last 168 hours. BNP (last 3 results) No results for input(s): BNP in the last 8760 hours.  ProBNP  (last 3 results) No results for input(s): PROBNP in the last 8760 hours.  CBG:  Recent Labs Lab 06/09/15 2204 06/09/15 2339 06/10/15 0413 06/10/15 0759 06/10/15 1213  GLUCAP 119* 181* 144* 134* 123*    No results found for this or any previous visit (from the past 240 hour(s)).   Studies: Dg Chest 1 View  06/09/2015  CLINICAL DATA:  RIGHT hip pain EXAM: CHEST 1 VIEW COMPARISON:  03/04/2015, CT abdomen 10/01/2014 FINDINGS: Sternotomy wires overlie normal cardiac silhouette. Nodular density at the LEFT lung base which corresponds to pleural-parenchymal scarring and thickening on comparison CT of 10/01/2014. Chronic bronchitic markings centrally. No pulmonary edema or pleural fluid. No pneumothorax. Atherosclerotic calcification of the abdominal aorta. IMPRESSION: 1. No acute cardiopulmonary process. 2. Chronic parenchymal nodular scarring at the LEFT lung base. 3. Chronic bronchitic markings. 4. Atherosclerotic calcification aorta. Electronically Signed   By: Suzy Bouchard M.D.   On: 06/09/2015 18:58   Dg Hip Unilat With Pelvis 2-3 Views Right  06/09/2015  CLINICAL DATA:  Pt states he was reaching for something and fell. Pt complains of right hip pain. EXAM: DG HIP (WITH OR WITHOUT PELVIS) 2-3V RIGHT COMPARISON:  CT 10/01/2014 FINDINGS: There is a comminuted fracture involving the base of the right femoral neck. No evidence for dislocation. There are degenerative changes involving both hips and lower lumbar spine. There is atherosclerotic calcification of vessels. Pelvis is intact. IMPRESSION: Comminuted fracture of the right femoral neck. Electronically Signed   By: Nolon Nations M.D.   On: 06/09/2015 18:54    Scheduled Meds: . atorvastatin  80 mg Oral q1800  . carvedilol  6.25 mg Oral BID WC  . citalopram  20 mg Oral Daily  . docusate sodium  100 mg Oral Daily  . donepezil  10 mg Oral QHS  . folic acid  1 mg Oral Daily  . hydrALAZINE  25 mg Oral BID  . insulin aspart  0-9 Units  Subcutaneous 6 times per day  . insulin glargine  10 Units Subcutaneous Daily  . isosorbide mononitrate  60 mg Oral QHS   Continuous Infusions: . sodium chloride      Principal Problem:   Fracture of femoral neck, right (HCC) Active Problems:   Essential hypertension   Diabetes mellitus with complication ([place])   Anticoagulated on Coumadin   Closed right hip fracture ([place])    Time spent: 35 min    Kelvin Cellar  [facility] Pager [phone]. If 7PM-7AM, please contact night-coverage at [email or web address], password [facility] 06/10/2015, 12:45 PM  LOS: 1 day

## 2015-06-10 NOTE — Clinical Social Work Note (Signed)
CSW met with patient and his daughter Donita Mcomber at [phone].  Patient is from [facility], patient and family feel he will benefit from short term rehab at SNF.  CSW spent time providing psychosocial support for patient and explained process for SNF placement to patient's family.  CSW explained that if the family is looking at having patient go to a SNF for Long Term Care the family would have to look at different facilities and make decision once patient has received his therapy.  CSW gave patient and his family list of SNFs and was given permission for SNF bed search process to begin once patient has had surgery and PT has evaluated patient.  CSW explained to patient's daughter how to apply for long term care Medicaid and also gave her the phone number for DSS of [place].  Patient and family agreeable to go to SNF once patient is medically ready for discharge and orders have been received.  Formal assessment to follow.   R. , MSW, LCSWA [phone] 06/10/2015 3:30 PM   

## 2015-06-10 NOTE — Transfer of Care (Signed)
Immediate Anesthesia Transfer of Care Note  Patient: Wesley [name]  Procedure(s) Performed: Procedure(s): RIGHT HIP HEMIARTHROPLASTY (Right)  Patient Location: PACU  Anesthesia Type:General  Level of Consciousness: awake  Airway & Oxygen Therapy: Patient Spontanous Breathing and Patient connected to nasal cannula oxygen  Post-op Assessment: Report given to RN and Post -op Vital signs reviewed and stable  Post vital signs: Reviewed and stable  Last Vitals:  Filed Vitals:   06/10/15 1500 06/10/15 1840  BP: 149/59   Pulse: 61   Temp: 36.9 C 36.8 C  Resp: 16     Complications: No apparent anesthesia complications

## 2015-06-11 ENCOUNTER — Encounter (HOSPITAL_COMMUNITY): Payer: Self-pay | Admitting: Orthopedic Surgery

## 2015-06-11 LAB — GLUCOSE, CAPILLARY
GLUCOSE-CAPILLARY: 109 mg/dL — AB (ref 65–99)
GLUCOSE-CAPILLARY: 116 mg/dL — AB (ref 65–99)
GLUCOSE-CAPILLARY: 130 mg/dL — AB (ref 65–99)
GLUCOSE-CAPILLARY: 143 mg/dL — AB (ref 65–99)
GLUCOSE-CAPILLARY: 145 mg/dL — AB (ref 65–99)
Glucose-Capillary: 138 mg/dL — ABNORMAL HIGH (ref 65–99)

## 2015-06-11 LAB — BASIC METABOLIC PANEL
ANION GAP: 8 (ref 5–15)
BUN: 37 mg/dL — ABNORMAL HIGH (ref 6–20)
CALCIUM: 8.5 mg/dL — AB (ref 8.9–10.3)
CO2: 26 mmol/L (ref 22–32)
Chloride: 105 mmol/L (ref 101–111)
Creatinine, Ser: 1.77 mg/dL — ABNORMAL HIGH (ref 0.61–1.24)
GFR, EST AFRICAN AMERICAN: 40 mL/min — AB (ref >60)
GFR, EST NON AFRICAN AMERICAN: 34 mL/min — AB (ref >60)
GLUCOSE: 117 mg/dL — AB (ref 65–99)
Potassium: 4.3 mmol/L (ref 3.5–5.1)
SODIUM: 139 mmol/L (ref 135–145)

## 2015-06-11 LAB — CBC
HCT: 31.3 % — ABNORMAL LOW (ref 39.0–52.0)
Hemoglobin: 10.4 g/dL — ABNORMAL LOW (ref 13.0–17.0)
MCH: 30.5 pg (ref 26.0–34.0)
MCHC: 33.2 g/dL (ref 30.0–36.0)
MCV: 91.8 fL (ref 78.0–100.0)
PLATELETS: 121 10*3/uL — AB (ref 150–400)
RBC: 3.41 MIL/uL — ABNORMAL LOW (ref 4.22–5.81)
RDW: 14.2 % (ref 11.5–15.5)
WBC: 9.4 10*3/uL (ref 4.0–10.5)

## 2015-06-11 LAB — PROTIME-INR
INR: 2.38 — ABNORMAL HIGH (ref 0.00–1.49)
PROTHROMBIN TIME: 25.7 s — AB (ref 11.6–15.2)

## 2015-06-11 MED ORDER — SODIUM CHLORIDE 0.9 % IV BOLUS (SEPSIS)
500.0000 mL | Freq: Once | INTRAVENOUS | Status: AC
  Administered 2015-06-11: 500 mL via INTRAVENOUS

## 2015-06-11 MED ORDER — WARFARIN SODIUM 6 MG PO TABS
6.0000 mg | ORAL_TABLET | Freq: Every day | ORAL | Status: DC
  Administered 2015-06-11: 6 mg via ORAL
  Filled 2015-06-11 (×2): qty 1

## 2015-06-11 NOTE — Evaluation (Signed)
Occupational Therapy Evaluation Patient Details Name: Wesley [name] MRN: [id number] DOB: 03-08-1932 Today's Date: 06/11/2015    History of Present Illness 82 yo admitted after fall at SNF with R hip fx s/p hemiarthoplasty. PMHx: DM, PVD, CVA, CKD, HTN, CABG   Clinical Impression   Pt pleasantly confused and reports varying levels of PLOF throughout session. Currently pt is total-max assist for ADLs at bed level. Pt was max assist +2 for rolling and total assist +2 for repositioning in bed. Plan is d/c to SNF for further rehab to maximize independence and safety with ADLs and functional mobility; agree with SNF placement. Will defer all further OT needs to the next venue of care.     Follow Up Recommendations  SNF;Supervision/Assistance - 24 hour    Equipment Recommendations  Other (comment) (TBD at next venue)    Recommendations for Other Services       Precautions / Restrictions Precautions Precautions: Fall;Posterior Hip Precaution Booklet Issued: No Restrictions Weight Bearing Restrictions: Yes RLE Weight Bearing: Weight bearing as tolerated      Mobility Bed Mobility Overal bed mobility: Needs Assistance;+2 for physical assistance Bed Mobility: Rolling Rolling: Max assist;+2 for physical assistance   Supine to sit: Total assist;+2 for physical assistance Sit to supine: Total assist;+2 for physical assistance   General bed mobility comments: Pt able to grasp hand rail to assist with rolling however max assist +2 was required to roll each direction. Verbal, tactile, and visual cues required for hand placement.  Transfers          General transfer comment: Not tested    Balance                            ADL Overall ADL's : Needs assistance/impaired                                       General ADL Comments: No family present for OT eval. Pt currenlty max-total assist for ADLs at bed level; pt able to reach for hand rails to  assist with rolling for changing bed linens but required multiple verbal and tactile cues. Pt is a poor historian and it was difficult to obtain PLOF for pt.      Vision     Perception     Praxis      Pertinent Vitals/Pain Pain Assessment: Faces Pain Score: 6  Faces Pain Scale: Hurts worst Pain Location: R hip with movement Pain Intervention(s): Limited activity within patient's tolerance;Monitored during session;Ice applied     Hand Dominance     Extremity/Trunk Assessment Upper Extremity Assessment Upper Extremity Assessment: Generalized weakness   Lower Extremity Assessment Lower Extremity Assessment: Defer to PT evaluation   Cervical / Trunk Assessment Cervical / Trunk Assessment: Kyphotic   Communication Communication Communication: HOH   Cognition Arousal/Alertness: Awake/alert Behavior During Therapy: Flat affect Overall Cognitive Status: No family/caregiver present to determine baseline cognitive functioning Area of Impairment: Orientation;Memory;Following commands;Safety/judgement Orientation Level: Disoriented to;Time   Memory: Decreased short-term memory;Decreased recall of precautions Following Commands: Follows one step commands inconsistently Safety/Judgement: Decreased awareness of deficits;Decreased awareness of safety     General Comments: Pt unable to provide PLOF, name of SNF and continues to state he had sx 3 days ago when operation was yesterday   General Comments       Exercises  Shoulder Instructions      Home Living Family/patient expects to be discharged to:: Skilled nursing facility                                        Prior Functioning/Environment Level of Independence: Needs assistance  Gait / Transfers Assistance Needed: Pt reports he used a walker to get around in his apartment and a wheelchair for community mobility. ADL's / Homemaking Assistance Needed: Pt is a poor historian; reports he was  completing ADLs independently PTA and also required assist with ADLs. No family present to confirm PLOF.        OT Diagnosis: Generalized weakness;Acute pain;Cognitive deficits   OT Problem List:     OT Treatment/Interventions:      OT Goals(Current goals can be found in the care plan section) Acute Rehab OT Goals Patient Stated Goal: unable to state OT Goal Formulation: Patient unable to participate in goal setting  OT Frequency:     Barriers to D/C:            Co-evaluation              End of Session Nurse Communication: Other (comment) (RN tech assisted with session, aware of mobility status)  Activity Tolerance: Patient limited by pain Patient left: in bed;with call bell/phone within reach;with bed alarm set;with SCD's reapplied   Time: 1007-1025 OT Time Calculation (min): 18 min Charges:  OT General Charges $OT Visit: 1 Procedure OT Evaluation $OT Eval High Complexity: 1 Procedure G-Codes:     Binnie Kand M.S., OTR/L Pager: [phone]  06/11/2015, 10:48 AM

## 2015-06-11 NOTE — Progress Notes (Signed)
[place] for warfarin Indication: VTE prophylaxis, history of PE/DVT  Allergies  Allergen Reactions  . Doxycycline Swelling    swelling  . Morphine And Related Nausea Only    NAUSEA    . Niacin And Related Other (See Comments)    Hot flashes  . Zoloft [Sertraline Hcl]     Listed on [facility]    Patient Measurements: Height: 6\' 2"  (188 cm) Weight: 210 lb (95.255 kg) IBW/kg (Calculated) : 82.2   Vital Signs: Temp: 98.9 F (37.2 C) (01/05 0418) Temp Source: Oral (01/05 0418) BP: 139/56 mmHg (01/05 0418) Pulse Rate: 59 (01/05 0418)  Labs:  Recent Labs  06/09/15 2004 06/09/15 2032 06/11/15 0455  HGB 13.3  --  10.4*  HCT 41.1  --  31.3*  PLT 136*  --  121*  LABPROT  --  24.5* 25.7*  INR  --  2.23* 2.38*  CREATININE 1.40*  --  1.77*    Estimated Creatinine Clearance: 38.1 mL/min (by C-G formula based on Cr of 1.77).   Assessment: 82 y.o. male who suffered a mechanical fall at nursing home. Now s/p right hip hemiarthroplasty. He was on warfarin pta for history of PE/DVTs. Per [facility] MAG he takes 6mg  of warfarin daily.  1/5 INR 2.38 - therapeutic.  Hg 13.3>10.4, pltc [id number] - chronic thrombocytopenia.  No bleeding reported.  Goal of Therapy:  INR 2-3 Monitor platelets by anticoagulation protocol: Yes   Plan:  Continue home dose of warfarin 6mg  daily Daily INR for now  Eudelia Bunch, Pharm.D. [id number] 06/11/2015 12:15 PM

## 2015-06-11 NOTE — Progress Notes (Signed)
Patient ID: Wesley [name], male   DOB: 09-09-1931, 82 y.o.   MRN: [id number] Postoperative day 1 right hip hemiarthroplasty. Patient's hemoglobin is stable at 10.4. Patient is not stable for independent ambulation and will need discharge to skilled nursing. Patient is stable for discharge to skilled nursing at this time.

## 2015-06-11 NOTE — Clinical Social Work Note (Signed)
Clinical Social Work Assessment  Patient Details  Name: Wesley [name] MRN: [id number] Date of Birth: 12-14-1931  Date of referral:  06/10/15               Reason for consult:  Facility Placement                Permission sought to share information with:  Facility Sport and exercise psychologist, Family Supports Permission granted to share information::  Yes, Verbal Permission Granted  Name::     Dar Baile Daughter [phone]  Agency::  SNF admissions  Relationship::     Contact Information:     Housing/Transportation Living arrangements for the past 2 months:  [place] of Information:  Adult Children, Patient Patient Interpreter Needed:  None Criminal Activity/Legal Involvement Pertinent to Current Situation/Hospitalization:  No - Comment as needed Significant Relationships:  Adult Children Lives with:  Facility Resident Do you feel safe going back to the place where you live?  Yes (Patient will need to go to SNF to receive some therapy.) Need for family participation in patient care:  Yes (Comment) (Patient has mild dementia)  Care giving concerns: Patient's family feels he needs more care then he is getting at ALF.   Social Worker assessment / plan:  Patient is a 82 year old male who is from [place] on [address].  Patient is alert and oriented x2, patient has some dementia, his daughter is the main decision maker for patient.  Patient's family would like patient to go to SNF in the [facility] area, patient requested that [name] speak with his daughter for decision making.  Patient's family asked several questions regarding SNF placement and also long term care.  Patient's family is considering having patient go to long term care once he has finished with his rehab.  CSW explained to patient's daughter how to apply for Medicaid and also what to expect at a SNF.  Patient and family did not have any other questions or concerns.  Employment status:   Retired Nurse, adult PT Recommendations:  [place] / Referral to community resources:  [place]  Patient/Family's Response to care:  Patient and family agreeable to going to SNF for short term.   Patient/Family's Understanding of and Emotional Response to Diagnosis, Current Treatment, and Prognosis: Patient's family understand diagnosis and current treatment plan.  Emotional Assessment Appearance:  Appears stated age Attitude/Demeanor/Rapport:    Affect (typically observed):  Appropriate, Calm, Stable, Quiet, Pleasant Orientation:  Oriented to Self, Oriented to Place Alcohol / Substance use:  Not Applicable Psych involvement (Current and /or in the community):  No (Comment)  Discharge Needs  Concerns to be addressed:  No discharge needs identified Readmission within the last 30 days:  No Current discharge risk:  None Barriers to Discharge:  No Barriers Identified   Ross Ludwig, LCSWA 06/11/2015, 10:06 AM

## 2015-06-11 NOTE — Clinical Social Work Note (Signed)
CSW contacted patient's daughter [name] at [phone] to present bed offers, patient's daughter chose [facility] for short term rehab.  CSW to continue to follow patient's progress.  Jones Broom. Ben Avon, MSW, [place] 06/11/2015 4:13 PM

## 2015-06-11 NOTE — Evaluation (Signed)
Physical Therapy Evaluation Patient Details Name: Wesley [name] MRN: [id number] DOB: [birth year] Today's Date: 06/11/2015   History of Present Illness  82 yo admitted after fall at SNF with R hip fx s/p hemiarthoplasty. PMHx: DM, PVD, CVA, CKD, HTN, CABG  Clinical Impression  Wesley [name] in bed on arrival, pleasantly confused and unable to recall precautions provided with handout in room. He states he has been in SNF for 6 months and does not walk but unable to expand upon that information and no family present. Pt fearful of mobility at this time due to pain. Pt with decreased strength, ROM, transfer ability, cognition,and function who will benefit from acute therapy to maximize strength and function to decrease burden of care.    Follow Up Recommendations SNF;Supervision/Assistance - 24 hour    Equipment Recommendations  Wheelchair (measurements PT);Wheelchair cushion (measurements PT)    Recommendations for Other Services       Precautions / Restrictions Precautions Precautions: Fall;Posterior Hip Precaution Booklet Issued: Yes (comment) Restrictions RLE Weight Bearing: Weight bearing as tolerated      Mobility  Bed Mobility Overal bed mobility: +2 for physical assistance;Needs Assistance;+ 2 for safety/equipment Bed Mobility: Supine to Sit;Sit to Supine     Supine to sit: Total assist;+2 for physical assistance Sit to supine: Total assist;+2 for physical assistance   General bed mobility comments: pt with max cues for assist to pivot to EOB but pt not assisting with transfer other than slight assist to bring trunk off of surface with use of rail after lower body to EOB. Utilized pad and helicopter technique to EOB and back  Transfers Overall transfer level: Needs assistance   Transfers: Sit to/from Stand Sit to Stand: From elevated surface;Total assist;+2 physical assistance         General transfer comment: attempted trying to stand x 2 with use of belt and pad, elevated  bed, max cues and RLE blocked. Unable to clear buttock fully with 2 trials and returned to bed. Maxi sky in room not charged or functional for lift to chair at this time  Ambulation/Gait Ambulation/Gait assistance:  (unable)              Stairs            Wheelchair Mobility    Modified Rankin (Stroke Patients Only)       Balance Overall balance assessment: Needs assistance   Sitting balance-Leahy Scale: Poor Sitting balance - Comments: max cues for anterior translation, use of bil UE to support trunk and mod to min assist for sitting balance Postural control: Posterior lean Standing balance support: Bilateral upper extremity supported Standing balance-Leahy Scale: Zero                               Pertinent Vitals/Pain Pain Assessment: 0-10 Pain Score: 6  Pain Location: right hip with movement Pain Intervention(s): Limited activity within patient's tolerance;Monitored during session;Repositioned    Home Living Family/patient expects to be discharged to:: Skilled nursing facility                      Prior Function Level of Independence: Needs assistance   Gait / Transfers Assistance Needed: pt states he does not walk and states he does transfer to a chair but unable to state if with assist  ADL's / Homemaking Assistance Needed: pt unable to provide and no family present        Hand  Dominance        Extremity/Trunk Assessment   Upper Extremity Assessment: Generalized weakness           Lower Extremity Assessment: Generalized weakness      Cervical / Trunk Assessment: Kyphotic  Communication   Communication: HOH  Cognition Arousal/Alertness: Awake/alert Behavior During Therapy: Flat affect Overall Cognitive Status: No family/caregiver present to determine baseline cognitive functioning Area of Impairment: Orientation;Memory;Following commands;Safety/judgement Orientation Level: Disoriented to;Time   Memory: Decreased  short-term memory;Decreased recall of precautions Following Commands: Follows one step commands inconsistently Safety/Judgement: Decreased awareness of deficits;Decreased awareness of safety     General Comments: Pt unable to provide PLOF, name of SNF and continues to state he had sx 3 days ago when operation was yesterday    General Comments      Exercises        Assessment/Plan    PT Assessment Patient needs continued PT services  PT Diagnosis Difficulty walking;Generalized weakness;Acute pain;Altered mental status   PT Problem List Decreased strength;Decreased range of motion;Decreased activity tolerance;Decreased balance;Decreased mobility;Pain;Decreased safety awareness;Decreased knowledge of use of DME;Decreased cognition  PT Treatment Interventions Functional mobility training;Therapeutic activities;Therapeutic exercise;Patient/family education   PT Goals (Current goals can be found in the Care Plan section) Acute Rehab PT Goals Patient Stated Goal: pt agreeable to attempt moving unable to state further goals PT Goal Formulation: With patient Time For Goal Achievement: 06/25/15 Potential to Achieve Goals: Fair    Frequency Min 3X/week   Barriers to discharge Decreased caregiver support      Co-evaluation               End of Session Equipment Utilized During Treatment: Gait belt Activity Tolerance: Patient tolerated treatment well Patient left: in bed;with call bell/phone within reach;with bed alarm set Nurse Communication: Mobility status;Weight bearing status;Precautions         Time: [id number] PT Time Calculation (min) (ACUTE ONLY): 16 min   Charges:   PT Evaluation $PT Eval Moderate Complexity: 1 Procedure     PT G CodesMelford Aase 06/11/2015, 10:19 AM Elwyn Reach, [place]

## 2015-06-11 NOTE — Progress Notes (Signed)
[facility] PROGRESS NOTE  Wesley [name] [id number] DOB: 09/16/1931 DOA: 06/09/2015 PCP: Tammi Sou, MD  Assessment/Plan: 1. Comminuted fracture right femoral neck. -Wesley [name] is a nursing home resident, having a fall at his facility. He is currently nonambulatory, leaned forward to pick something up and fell to the floor afterwards Wesley [name] experienced significant right hip fracture. -Imaging studies performed in the emergency department revealed a comminuted fracture of right femoral neck. -He was seen and evaluated by Wesley [name] or orthopedic surgery -Having multiple comorbidities, advanced age, poor functional status at baseline he is likely to have at least an intermediate surgical risk. He does not appear to have acute cardiopulmonary issues at this time.  -Status post right hip hemiarthroplasty, procedure performed on 06/10/2015 by Wesley [name] of orthopedic surgery. Patient tolerated procedure well there were no immediate complications. -Plan for patient to be discharged to skilled nursing facility in the next 24-48 hours.  2.   History of coronary artery disease. -Patient having history of cardiac catheterization 2 years ago, did not have targets for revascularization and managed medically -He currently denies chest pain or shortness of breath. -Continue statin, beta blocker and nitrate.  3.  Stage III chronic kidney disease, Acute on Chronic -Having a baseline creatinine between 1.3 and 1.5 -A.m. lab work on 06/11/2015 showing increased creatinine of 1.7. I suspect secondary to prerenal azotemia -Appears dry on physical examination. He is currently has normal saline running at 75 mL per hour, will give him a 500 mL bolus of normal saline today, repeat labs in a.m.  4.  History of DVT -He is on anticoagulant with warfarin. -Labs showing an INR of 2.23. Warfarin was discontinued for anticipated procedure. -Pharmacy consult for restarting warfarin.  5.   Hypertension. -Initially was hypertensive with systolic blood pressures in the 200s, suspect related to pain from hip fracture -Blood pressures improving this morning. Last blood pressure 139/56. -Continue Imdur  60 mg by mouth daily and hydralazine 25 mg by mouth twice a day.  6.  Type 2 diabetes mellitus. -Blood sugars controlled, continue Lantus 10 units subcutaneous daily, sliding scale coverage with Accu-Cheks.  Code Status: Full Code Family Communication: I spoke to his daughter Wesley [name] over telephone conversation  Disposition Plan:    Consultants:  Orthpedic Surgery  Procedures:   HPI/Subjective: Wesley [name] is a pleasant 82 year old gentleman with multiple comorbidities including diabetes mellitus, hypertension, dyslipidemia, history of DVT, CVA, currently a nursing home resident who was admitted to the medicine service on 06/08/2014 presented after having a fall at his facility. Imaging revealed the presence of a comminuted fracture of the right femoral neck. Orthopedic surgery was consulted.  Objective: Filed Vitals:   06/11/15 0002 06/11/15 0418  BP: 131/51 139/56  Pulse: 59 59  Temp: 98.5 F (36.9 C) 98.9 F (37.2 C)  Resp: 18 16    Intake/Output Summary (Last 24 hours) at 06/11/15 1045 Last data filed at 06/11/15 0715  Gross per 24 hour  Intake 920.33 ml  Output    300 ml  Net 620.33 ml   Filed Weights   06/09/15 1813  Weight: 95.255 kg (210 lb)    Exam:   General:  Patient is confused, disoriented, although no acute distress. He is hard of hearing.  Cardiovascular: Regular rate and rhythm normal S1-S2  Respiratory: Normal respiratory effort  Abdomen: Soft nontender nondistended  Musculoskeletal: No edema, status post right hemiarthroplasty, no evidence of hematoma or developing infection.  Data Reviewed: Basic Metabolic Panel:  Recent  Labs Lab 06/09/15 2004 06/11/15 0455  NA 139 139  K 4.0 4.3  CL 105 105  CO2 26 26  GLUCOSE  113* 117*  BUN 28* 37*  CREATININE 1.40* 1.77*  CALCIUM 8.9 8.5*   Liver Function Tests: No results for input(s): AST, ALT, ALKPHOS, BILITOT, PROT, ALBUMIN in the last 168 hours. No results for input(s): LIPASE, AMYLASE in the last 168 hours. No results for input(s): AMMONIA in the last 168 hours. CBC:  Recent Labs Lab 06/09/15 2004 06/11/15 0455  WBC 14.3* 9.4  NEUTROABS 11.8*  --   HGB 13.3 10.4*  HCT 41.1 31.3*  MCV 93.4 91.8  PLT 136* 121*   Cardiac Enzymes: No results for input(s): CKTOTAL, CKMB, CKMBINDEX, TROPONINI in the last 168 hours. BNP (last 3 results) No results for input(s): BNP in the last 8760 hours.  ProBNP (last 3 results) No results for input(s): PROBNP in the last 8760 hours.  CBG:  Recent Labs Lab 06/10/15 1614 06/10/15 1845 06/11/15 0003 06/11/15 0416 06/11/15 0745  GLUCAP 105* 92 116* 109* 130*    No results found for this or any previous visit (from the past 240 hour(s)).   Studies: Dg Chest 1 View  06/09/2015  CLINICAL DATA:  RIGHT hip pain EXAM: CHEST 1 VIEW COMPARISON:  03/04/2015, CT abdomen 10/01/2014 FINDINGS: Sternotomy wires overlie normal cardiac silhouette. Nodular density at the LEFT lung base which corresponds to pleural-parenchymal scarring and thickening on comparison CT of 10/01/2014. Chronic bronchitic markings centrally. No pulmonary edema or pleural fluid. No pneumothorax. Atherosclerotic calcification of the abdominal aorta. IMPRESSION: 1. No acute cardiopulmonary process. 2. Chronic parenchymal nodular scarring at the LEFT lung base. 3. Chronic bronchitic markings. 4. Atherosclerotic calcification aorta. Electronically Signed   By: Suzy Bouchard M.D.   On: 06/09/2015 18:58   Dg Hip Unilat With Pelvis 2-3 Views Right  06/09/2015  CLINICAL DATA:  Pt states he was reaching for something and fell. Pt complains of right hip pain. EXAM: DG HIP (WITH OR WITHOUT PELVIS) 2-3V RIGHT COMPARISON:  CT 10/01/2014 FINDINGS: There is a  comminuted fracture involving the base of the right femoral neck. No evidence for dislocation. There are degenerative changes involving both hips and lower lumbar spine. There is atherosclerotic calcification of vessels. Pelvis is intact. IMPRESSION: Comminuted fracture of the right femoral neck. Electronically Signed   By: Nolon Nations M.D.   On: 06/09/2015 18:54    Scheduled Meds: . atorvastatin  80 mg Oral q1800  . carvedilol  6.25 mg Oral BID WC  . citalopram  20 mg Oral Daily  . docusate sodium  100 mg Oral BID  . donepezil  10 mg Oral QHS  . feeding supplement (GLUCERNA SHAKE)  237 mL Oral BID BM  . folic acid  1 mg Oral Daily  . hydrALAZINE  25 mg Oral BID  . insulin aspart  0-9 Units Subcutaneous 6 times per day  . insulin glargine  10 Units Subcutaneous Daily  . isosorbide mononitrate  60 mg Oral QHS  . Warfarin - Pharmacist Dosing Inpatient   Does not apply q1800   Continuous Infusions: . sodium chloride 50 mL/hr (06/10/15 1656)  . sodium chloride 10 mL/hr at 06/10/15 2238    Principal Problem:   Fracture of femoral neck, right (HCC) Active Problems:   Essential hypertension   Diabetes mellitus with complication (HCC)   Anticoagulated on Coumadin   Closed right hip fracture (HCC)    Time spent: 35 min  Kelvin Cellar  [facility] Pager [phone]. If 7PM-7AM, please contact night-coverage at [email or web address], password [facility] 06/11/2015, 10:45 AM  LOS: 2 days

## 2015-06-11 NOTE — NC FL2 (Signed)
[place] LEVEL OF CARE SCREENING TOOL     IDENTIFICATION  Patient Name: Wesley [name] Birthdate: 11/28/1931 Sex: male Admission Date (Current Location): 06/09/2015  [facility] and [place] Number:  Herbalist and Address:  [facility] [place]. [facility], [place] [address] [place], [place] [address]      Provider Number: [id number]  Attending Physician Name and Address:  Kelvin Cellar, MD  Relative Name and Phone Number:       Current Level of Care: Hospital Recommended Level of Care: [place] Prior Approval Number:    Date Approved/Denied:   PASRR Number: [id number]  Discharge Plan: SNF    Current Diagnoses: Patient Active Problem List   Diagnosis Date Noted  . Fracture of femoral neck, right ([place]) 06/09/2015  . Anticoagulated on Coumadin 06/09/2015  . Closed right hip fracture ([place]) 06/09/2015  . Screening for tuberculosis 12/17/2014  . Other specified hypotension 12/04/2014  . Acute confusional state 12/04/2014  . Type 2 diabetes mellitus without complication ([place]) 99991111  . Adjustment disorder with depressed mood 12/04/2014  . Elevated INR 12/04/2014  . Lower extremity venous stasis 12/30/2013  . Tinea pedis 12/30/2013  . DVT, lower extremity, recurrent ([place]) 07/14/2013  . Diabetes mellitus with complication ([place]) [id number]  . History of pulmonary embolism 01/08/2013  . History of DVT (deep vein thrombosis) 01/08/2013  . Constipation, acute 01/07/2013  . NSTEMI (non-ST elevated myocardial infarction) ([place]) 09/21/2012  . Dyslipidemia 09/21/2012  . Elevated TSH 09/21/2012  . Angina pectoris, unstable ([place]) 04/29/2012  . History of CVA (cerebrovascular accident) 04/29/2012  . CKD (chronic kidney disease), stage III 04/29/2012  . Essential hypertension 04/29/2012  . Kidney stone 02/28/2011  . Calf DVT (deep venous thrombosis) ([place])   . PVD (peripheral vascular disease) ([place])   . Coronary artery disease   . Hx  pulmonary embolism     [place] RESPIRATION BLADDER Height & Weight    Self, Situation  Normal Incontinent 6\' 2"  (188 cm) 210 lbs.  BEHAVIORAL SYMPTOMS/MOOD NEUROLOGICAL BOWEL NUTRITION STATUS      Continent Diet (Regular)  AMBULATORY STATUS COMMUNICATION OF NEEDS Skin   Limited Assist Verbally Surgical wounds                       Personal Care Assistance Level of Assistance  Bathing, Dressing Bathing Assistance: Limited assistance   Dressing Assistance: Limited assistance     Functional Limitations Info             SPECIAL CARE FACTORS FREQUENCY  PT (By licensed PT)     PT Frequency: 5x a day              Contractures      Additional Factors Info  Insulin Sliding Scale       Insulin Sliding Scale Info: Up to 6x a day       Current Medications (06/11/2015):  This is the current hospital active medication list Current Facility-Administered Medications  Medication Dose Route Frequency Provider Last Rate Last Dose  . 0.9 %  sodium chloride infusion   Intravenous Continuous Kelvin Cellar, MD 50 mL/hr at 06/10/15 1656 50 mL/hr at 06/10/15 1656  . 0.9 %  sodium chloride infusion   Intravenous Continuous Newt Minion, MD 10 mL/hr at 06/10/15 2238    . acetaminophen (TYLENOL) tablet 650 mg  650 mg Oral Q6H PRN Newt Minion, MD       Or  . acetaminophen (  TYLENOL) suppository 650 mg  650 mg Rectal Q6H PRN Newt Minion, MD      . atorvastatin (LIPITOR) tablet 80 mg  80 mg Oral q1800 Etta Quill, DO      . bisacodyl (DULCOLAX) suppository 10 mg  10 mg Rectal Daily PRN Newt Minion, MD      . carvedilol (COREG) tablet 6.25 mg  6.25 mg Oral BID WC Etta Quill, DO   6.25 mg at 06/11/15 0827  . citalopram (CELEXA) tablet 20 mg  20 mg Oral Daily Etta Quill, DO   20 mg at 06/11/15 0827  . docusate sodium (COLACE) capsule 100 mg  100 mg Oral BID Newt Minion, MD   100 mg at 06/11/15 0826  . donepezil (ARICEPT) tablet 10 mg  10 mg Oral QHS Etta Quill, DO   10 mg at 06/10/15 2236  . feeding supplement (GLUCERNA SHAKE) (GLUCERNA SHAKE) liquid 237 mL  237 mL Oral BID BM Dale [place], RD   237 mL at 06/11/15 0827  . folic acid (FOLVITE) tablet 1 mg  1 mg Oral Daily Etta Quill, DO   1 mg at 06/11/15 [id number]  . hydrALAZINE (APRESOLINE) injection 10 mg  10 mg Intravenous Q4H PRN Etta Quill, DO   10 mg at 06/09/15 2217  . hydrALAZINE (APRESOLINE) tablet 25 mg  25 mg Oral BID Etta Quill, DO   25 mg at 06/11/15 [id number]  . HYDROcodone-acetaminophen (NORCO/VICODIN) 5-325 MG per tablet 1-2 tablet  1-2 tablet Oral Q6H PRN Etta Quill, DO   2 tablet at 06/11/15 0620  . HYDROcodone-acetaminophen (NORCO/VICODIN) 5-325 MG per tablet 1-2 tablet  1-2 tablet Oral Q6H PRN Newt Minion, MD      . insulin aspart (novoLOG) injection 0-9 Units  0-9 Units Subcutaneous 6 times per day Etta Quill, DO   1 Units at 06/11/15 0827  . insulin glargine (LANTUS) injection 10 Units  10 Units Subcutaneous Daily Etta Quill, DO   10 Units at 06/11/15 [phone]  . isosorbide mononitrate (IMDUR) 24 hr tablet 60 mg  60 mg Oral QHS Etta Quill, DO   60 mg at 06/10/15 2236  . menthol-cetylpyridinium (CEPACOL) lozenge 3 mg  1 lozenge Oral PRN Newt Minion, MD       Or  . phenol (CHLORASEPTIC) mouth spray 1 spray  1 spray Mouth/Throat PRN Newt Minion, MD      . methocarbamol (ROBAXIN) tablet 500 mg  500 mg Oral Q6H PRN Newt Minion, MD       Or  . methocarbamol (ROBAXIN) 500 mg in dextrose 5 % 50 mL IVPB  500 mg Intravenous Q6H PRN Newt Minion, MD      . metoCLOPramide (REGLAN) tablet 5-10 mg  5-10 mg Oral Q8H PRN Kelvin Cellar, MD       Or  . metoCLOPramide (REGLAN) injection 5-10 mg  5-10 mg Intravenous Q8H PRN Kelvin Cellar, MD      . morphine 2 MG/ML injection 0.5 mg  0.5 mg Intravenous Q2H PRN Etta Quill, DO   0.5 mg at 06/10/15 1248  . nitroGLYCERIN (NITROSTAT) SL tablet 0.4 mg  0.4 mg Sublingual Q5 Min x 3 PRN Etta Quill, DO       . ondansetron [facility]) tablet 4 mg  4 mg Oral Q6H PRN Newt Minion, MD       Or  . ondansetron [facility]) injection 4  mg  4 mg Intravenous Q6H PRN Newt Minion, MD      . polyethylene glycol (MIRALAX / GLYCOLAX) packet 17 g  17 g Oral Daily PRN Etta Quill, DO      . Warfarin - Pharmacist Dosing Inpatient   Does not apply [place], [facility]         Discharge Medications: Please see discharge summary for a list of discharge medications.  Relevant Imaging Results:  Relevant Lab Results:   Additional Information SSN [id number]  Ross Ludwig, [place]

## 2015-06-11 NOTE — Progress Notes (Signed)
Utilization review completed.  

## 2015-06-12 ENCOUNTER — Encounter (HOSPITAL_COMMUNITY): Payer: Self-pay | Admitting: Orthopedic Surgery

## 2015-06-12 DIAGNOSIS — M21251 Flexion deformity, right hip: Secondary | ICD-10-CM | POA: Diagnosis not present

## 2015-06-12 DIAGNOSIS — Z7901 Long term (current) use of anticoagulants: Secondary | ICD-10-CM | POA: Diagnosis not present

## 2015-06-12 DIAGNOSIS — M25551 Pain in right hip: Secondary | ICD-10-CM | POA: Diagnosis not present

## 2015-06-12 DIAGNOSIS — S72011D Unspecified intracapsular fracture of right femur, subsequent encounter for closed fracture with routine healing: Secondary | ICD-10-CM | POA: Diagnosis not present

## 2015-06-12 DIAGNOSIS — Z5181 Encounter for therapeutic drug level monitoring: Secondary | ICD-10-CM | POA: Diagnosis not present

## 2015-06-12 DIAGNOSIS — R2681 Unsteadiness on feet: Secondary | ICD-10-CM | POA: Diagnosis not present

## 2015-06-12 DIAGNOSIS — N183 Chronic kidney disease, stage 3 (moderate): Secondary | ICD-10-CM | POA: Diagnosis not present

## 2015-06-12 DIAGNOSIS — Z86718 Personal history of other venous thrombosis and embolism: Secondary | ICD-10-CM | POA: Diagnosis not present

## 2015-06-12 DIAGNOSIS — S72001A Fracture of unspecified part of neck of right femur, initial encounter for closed fracture: Secondary | ICD-10-CM | POA: Diagnosis not present

## 2015-06-12 DIAGNOSIS — L89899 Pressure ulcer of other site, unspecified stage: Secondary | ICD-10-CM | POA: Diagnosis not present

## 2015-06-12 DIAGNOSIS — S72001D Fracture of unspecified part of neck of right femur, subsequent encounter for closed fracture with routine healing: Secondary | ICD-10-CM | POA: Diagnosis not present

## 2015-06-12 DIAGNOSIS — E1122 Type 2 diabetes mellitus with diabetic chronic kidney disease: Secondary | ICD-10-CM | POA: Diagnosis not present

## 2015-06-12 DIAGNOSIS — Z9181 History of falling: Secondary | ICD-10-CM | POA: Diagnosis not present

## 2015-06-12 DIAGNOSIS — M6281 Muscle weakness (generalized): Secondary | ICD-10-CM | POA: Diagnosis not present

## 2015-06-12 DIAGNOSIS — I10 Essential (primary) hypertension: Secondary | ICD-10-CM | POA: Diagnosis not present

## 2015-06-12 LAB — BASIC METABOLIC PANEL
ANION GAP: 6 (ref 5–15)
BUN: 39 mg/dL — ABNORMAL HIGH (ref 6–20)
CALCIUM: 8.1 mg/dL — AB (ref 8.9–10.3)
CO2: 24 mmol/L (ref 22–32)
Chloride: 108 mmol/L (ref 101–111)
Creatinine, Ser: 1.73 mg/dL — ABNORMAL HIGH (ref 0.61–1.24)
GFR, EST AFRICAN AMERICAN: 41 mL/min — AB (ref >60)
GFR, EST NON AFRICAN AMERICAN: 35 mL/min — AB (ref >60)
Glucose, Bld: 111 mg/dL — ABNORMAL HIGH (ref 65–99)
Potassium: 3.8 mmol/L (ref 3.5–5.1)
SODIUM: 138 mmol/L (ref 135–145)

## 2015-06-12 LAB — PROTIME-INR
INR: 3.63 — AB (ref 0.00–1.49)
PROTHROMBIN TIME: 35.3 s — AB (ref 11.6–15.2)

## 2015-06-12 LAB — CBC
HCT: 27.8 % — ABNORMAL LOW (ref 39.0–52.0)
Hemoglobin: 9.4 g/dL — ABNORMAL LOW (ref 13.0–17.0)
MCH: 30.9 pg (ref 26.0–34.0)
MCHC: 33.8 g/dL (ref 30.0–36.0)
MCV: 91.4 fL (ref 78.0–100.0)
PLATELETS: 102 10*3/uL — AB (ref 150–400)
RBC: 3.04 MIL/uL — ABNORMAL LOW (ref 4.22–5.81)
RDW: 14 % (ref 11.5–15.5)
WBC: 9.1 10*3/uL (ref 4.0–10.5)

## 2015-06-12 LAB — GLUCOSE, CAPILLARY
GLUCOSE-CAPILLARY: 105 mg/dL — AB (ref 65–99)
GLUCOSE-CAPILLARY: 110 mg/dL — AB (ref 65–99)
GLUCOSE-CAPILLARY: 125 mg/dL — AB (ref 65–99)
GLUCOSE-CAPILLARY: 170 mg/dL — AB (ref 65–99)

## 2015-06-12 MED ORDER — HYDROCODONE-ACETAMINOPHEN 5-325 MG PO TABS: 1.0000 | ORAL_TABLET | Freq: Four times a day (QID) | ORAL | Status: AC | PRN

## 2015-06-12 MED ORDER — BISACODYL 10 MG RE SUPP: 10.0000 mg | Freq: Every day | RECTAL | Status: AC | PRN

## 2015-06-12 MED ORDER — SODIUM CHLORIDE 0.9 % IV BOLUS (SEPSIS)
500.0000 mL | Freq: Once | INTRAVENOUS | Status: AC
  Administered 2015-06-12: 500 mL via INTRAVENOUS

## 2015-06-12 MED ORDER — INSULIN GLARGINE 100 UNIT/ML ~~LOC~~ SOLN: 10.0000 [IU] | Freq: Every day | SUBCUTANEOUS | Status: AC

## 2015-06-12 NOTE — Clinical Social Work Note (Addendum)
CSW received phone call from [facility], who said they can take patient today.  Mobile to contact patient's daughter to complete paperwork.  CSW to make arrangements to have patient transported to [facility] via [facility].  CSW contacted patient's daughter Damione Posten at [phone] to inform her that patient will be discharging around 1pm.  Patient to be d/c'ed today to [facility].  Patient and family agreeable to plans will transport via ems RN to call report to [phone] ask for [place]. [place], MSW, [place] 06/12/2015 10:04 AM

## 2015-06-12 NOTE — Progress Notes (Signed)
[place] for warfarin Indication: VTE prophylaxis, history of PE/DVT  Allergies  Allergen Reactions  . Doxycycline Swelling    swelling  . Morphine And Related Nausea Only    NAUSEA    . Niacin And Related Other (See Comments)    Hot flashes  . Zoloft [Sertraline Hcl]     Listed on [facility]    Patient Measurements: Height: 6\' 2"  (188 cm) Weight: 210 lb (95.255 kg) IBW/kg (Calculated) : 82.2   Vital Signs: Temp: 98.9 F (37.2 C) (01/06 0618) Temp Source: Oral (01/06 0618) BP: 134/53 mmHg (01/06 0618) Pulse Rate: 60 (01/06 0618)  Labs:  Recent Labs  06/09/15 2004 06/09/15 2032 06/11/15 0455 06/12/15 0455  HGB 13.3  --  10.4* 9.4*  HCT 41.1  --  31.3* 27.8*  PLT 136*  --  121* 102*  LABPROT  --  24.5* 25.7* 35.3*  INR  --  2.23* 2.38* 3.63*  CREATININE 1.40*  --  1.77* 1.73*    Estimated Creatinine Clearance: 38.9 mL/min (by C-G formula based on Cr of 1.73).   Assessment: 82 y.o. male who suffered a mechanical fall at nursing home. Now s/p right hip hemiarthroplasty on 1/4. He was on warfarin pta for history of PE/DVTs. Per [facility] he takes 6mg  of warfarin daily.  INR 2.38 > 3.63.  Hg 13.3>>9.4, pltc 136>>102 - chronic thrombocytopenia.  No bleeding reported. No bleeding noted per chart. Pt will be discharged to SNF today  Goal of Therapy:  INR 2-3 Monitor platelets by anticoagulation protocol: Yes   Plan:  Hold coumadin today, recommend check INR on Monday if not be able it over the weekend.  Daily INR for now  Maryanna Shape, PharmD, BCPS  Clinical Pharmacist  Pager: [phone]   06/12/2015 11:26 AM

## 2015-06-12 NOTE — Care Management Important Message (Signed)
Important Message  Patient Details  Name: CASPER VERDONE MRN: [id number] Date of Birth: 1931/11/22   Medicare Important Message Given:  Yes    Barb Merino Coutney [place] 06/12/2015, 2:31 PM

## 2015-06-12 NOTE — Clinical Social Work Placement (Signed)
   CLINICAL SOCIAL WORK PLACEMENT  NOTE  Date:  06/12/2015  Patient Details  Name: Wesley [name] MRN: [id number] Date of Birth: 01-Mar-1932  Clinical Social Work is seeking post-discharge placement for this patient at the [place] level of care (*CSW will initial, date and re-position this form in  chart as items are completed):  Yes   Patient/family provided with [place] Work Department's list of facilities offering this level of care within the geographic area requested by the patient (or if unable, by the patient's family).  Yes   Patient/family informed of their freedom to choose among providers that offer the needed level of care, that participate in Medicare, Medicaid or managed care program needed by the patient, have an available bed and are willing to accept the patient.  Yes   Patient/family informed of [place]'s ownership interest in [facility] and [facility], as well as of the fact that they are under no obligation to receive care at these facilities.  PASRR submitted to EDS on 06/11/15     PASRR number received on       Existing PASRR number confirmed on 06/11/15     FL2 transmitted to all facilities in geographic area requested by pt/family on 06/11/15     FL2 transmitted to all facilities within larger geographic area on 06/11/15     Patient informed that his/her managed care company has contracts with or will negotiate with certain facilities, including the following:        Yes   Patient/family informed of bed offers received.  Patient chooses bed at [facility]     Physician recommends and patient chooses bed at      Patient to be transferred to [facility] on 06/12/15.  Patient to be transferred to facility by [facility]     Patient family notified on 06/12/15 of transfer.  Name of family member notified:  Akilles Geib, patient's daughter     PHYSICIAN Please sign FL2     Additional Comment:     _______________________________________________ Ross Ludwig, LCSWA 06/12/2015, 1:35 PM

## 2015-06-12 NOTE — Discharge Summary (Signed)
Physician Discharge Summary  Wesley [name] [id number] DOB: 1932-01-24 DOA: 06/09/2015  PCP: Tammi Sou, MD  Admit date: 06/09/2015 Discharge date: 06/12/2015  Time spent: 35 minutes  Recommendations for Outpatient Follow-up:  1. Patient is anticoagulated with warfarin for history of DVT. On the day of discharge he had an INR 3.63 for which I discontinued warfarin. Please follow-up on a repeat INR in 2 days after which pharmacy consultation should be made for restarting warfarin and proper dosing. 2. His hospitalization was complicated by acute on chronic renal failure, creatinine plateaued at 1.7. Would recommend repeating a BMP and 3-4 days. 3. Please follow-up on blood sugars, his Lantus dose was decreased during this hospitalization. 4. He was discharged to skilled nursing facility for acute rehabilitation.   Discharge Diagnoses:  Principal Problem:   Fracture of femoral neck, right (HCC) Active Problems:   Essential hypertension   Diabetes mellitus with complication ([place])   Anticoagulated on Coumadin   Closed right hip fracture ([place])   Discharge Condition: Stable  Diet recommendation: Car modified diet.  Filed Weights   06/09/15 1813  Weight: 95.255 kg (210 lb)    History of present illness:  Wesley [name] is a 82 y.o. male who suffered a mechanical fall while leaning forwards reaching for something at his NH. He is non-ambulatory at baseline. Had immediate R hip pain after fall, pain is severe with any movement even being rolled over in bed to clean him up. Better when lying still.  Hospital Course:  Wesley [name] is a pleasant 82 year old gentleman with multiple comorbidities including diabetes mellitus, hypertension, dyslipidemia, history of DVT, CVA, currently a nursing home resident who was admitted to the medicine service on 06/08/2014 presented after having a fall at his facility. Imaging revealed the presence of a comminuted fracture of the right femoral neck.  Orthopedic surgery was consulted.   Comminuted fracture right femoral neck. -Wesley [name] is a nursing home resident, having a fall at his facility. He is currently nonambulatory, leaned forward to pick something up and fell to the floor afterwards she experienced significant right hip fracture. -Imaging studies performed in the emergency department revealed a comminuted fracture of right femoral neck. -He was seen and evaluated by Wesley [name] or orthopedic surgery -Having multiple comorbidities, advanced age, poor functional status at baseline he is likely to have at least an intermediate surgical risk. He does not appear to have acute cardiopulmonary issues at this time.  -Status post right hip hemiarthroplasty, procedure performed on 06/10/2015 by Dr. Sharol [name] of orthopedic surgery. Patient tolerated procedure well there were no immediate complications. -Plan for patient to be discharged to skilled nursing facility  2. History of coronary artery disease. -Patient having history of cardiac catheterization 2 years ago, did not have targets for revascularization and managed medically -He currently denies chest pain or shortness of breath. -Continue statin, beta blocker and nitrate.  3. Stage III chronic kidney disease, Acute on Chronic -Having a baseline creatinine between 1.3 and 1.5 -A.m. lab work on 06/11/2015 showing increased creatinine of 1.7. I suspect secondary to prerenal azotemia -Appears dry on physical examination. He is currently has normal saline running at 75 mL per hour, will give him a 500 mL bolus of normal saline today, repeat labs in a.m.  4. History of DVT -He is on anticoagulant with warfarin. -On 06/12/2015 labs show an elevated INR of 3.63 for which Coumadin was discontinued on discharge. Labs were stable otherwise, CBC showed hemoglobin of 9.4, did not have evidence  of a bleed. -Would recommend checking an INR in 2 days as far as a pharmacy consultation as far as  restarting warfarin.  5. Hypertension. -Initially was hypertensive with systolic blood pressures in the 200s, suspect related to pain from hip fracture -Blood pressures improving  -Discharged on Imdur 60 mg by mouth daily and hydralazine 25 mg by mouth twice a day.  6. Type 2 diabetes mellitus. -Blood sugars controlled, continue Lantus 10 units subcutaneous daily, sliding scale coverage with Accu-Cheks.   Procedures:  Right hip hemiarthroplasty performed on 06/10/2015  Consultations:  Orthopedic surgery  Discharge Exam: Filed Vitals:   06/11/15 2002 06/12/15 0618  BP: 144/55 134/53  Pulse: 68 60  Temp: 98.7 F (37.1 C) 98.9 F (37.2 C)  Resp: 18 18    General: Patient is confused, disoriented, although no acute distress. He is hard of hearing.  Cardiovascular: Regular rate and rhythm normal S1-S2  Respiratory: Normal respiratory effort  Abdomen: Soft nontender nondistended  Musculoskeletal: No edema, status post right hemiarthroplasty, no evidence of hematoma or developing infection.  Discharge Instructions   Discharge Instructions    Call MD for:  difficulty breathing, headache or visual disturbances    Complete by:  As directed      Call MD for:  extreme fatigue    Complete by:  As directed      Call MD for:  hives    Complete by:  As directed      Call MD for:  persistant dizziness or light-headedness    Complete by:  As directed      Call MD for:  persistant nausea and vomiting    Complete by:  As directed      Call MD for:  redness, tenderness, or signs of infection (pain, swelling, redness, odor or green/yellow discharge around incision site)    Complete by:  As directed      Call MD for:  severe uncontrolled pain    Complete by:  As directed      Call MD for:  temperature >100.4    Complete by:  As directed      Call MD for:    Complete by:  As directed      Diet - low sodium heart healthy    Complete by:  As directed      Increase activity  slowly    Complete by:  As directed      Weight bearing as tolerated    Complete by:  As directed   Laterality:  right  Extremity:  Lower          Current Discharge Medication List    START taking these medications   Details  acetaminophen (TYLENOL) 500 MG tablet Take 1 tablet (500 mg total) by mouth every 6 (six) hours as needed for mild pain. Qty: 30 tablet, Refills: 0    bisacodyl (DULCOLAX) 10 MG suppository Place 1 suppository (10 mg total) rectally daily as needed for moderate constipation. Qty: 12 suppository, Refills: 0    HYDROcodone-acetaminophen (NORCO/VICODIN) 5-325 MG tablet Take 1 tablet by mouth every 6 (six) hours as needed for moderate pain. Qty: 12 tablet, Refills: 0      CONTINUE these medications which have CHANGED   Details  insulin glargine (LANTUS) 100 UNIT/ML injection Inject 0.1 mLs (10 Units total) into the skin daily. Qty: 10 mL, Refills: 11      CONTINUE these medications which have NOT CHANGED   Details  atorvastatin (LIPITOR) 80 MG tablet  TAKE 1 TABLET BY MOUTH EVERY DAY Qty: 90 tablet, Refills: 3    carvedilol (COREG) 6.25 MG tablet TAKE 1 TABLET (6.25 MG TOTAL) BY MOUTH 2 (TWO) TIMES DAILY WITH A MEAL. Qty: 180 tablet, Refills: 3    Cholecalciferol (VITAMIN D-3) 1000 UNITS CAPS Take 2,000 Units by mouth daily.    citalopram (CELEXA) 20 MG tablet Take 20 mg by mouth daily.    docusate sodium (COLACE) 100 MG capsule Take 100 mg by mouth daily.     donepezil (ARICEPT) 10 MG tablet Take 10 mg by mouth at bedtime.    folic acid (FOLVITE) 1 MG tablet Take 1 mg by mouth daily.    hydrALAZINE (APRESOLINE) 25 MG tablet TAKE 1 TABLET (25 MG TOTAL) BY MOUTH 2 (TWO) TIMES DAILY. Qty: 180 tablet, Refills: 3    isosorbide mononitrate (IMDUR) 60 MG 24 hr tablet Take 1 tablet (60 mg total) by mouth daily. Qty: 30 tablet, Refills: 2   Associated Diagnoses: Essential hypertension    nitroGLYCERIN (NITROSTAT) 0.4 MG SL tablet Place 0.4 mg under the  tongue every 5 (five) minutes x 3 doses as needed for chest pain.    OMEGA-3 FATTY ACIDS PO Take 1,000 mg by mouth daily.    polyethylene glycol (MIRALAX / GLYCOLAX) packet Take 17 g by mouth daily. Qty: 30 each, Refills: 0      STOP taking these medications     diphenhydramine-acetaminophen (TYLENOL PM) 25-500 MG TABS      warfarin (COUMADIN) 6 MG tablet        Allergies  Allergen Reactions  . Doxycycline Swelling    swelling  . Morphine And Related Nausea Only    NAUSEA    . Niacin And Related Other (See Comments)    Hot flashes  . Zoloft [Sertraline Hcl]     Listed on [facility]   Follow-up Information    Follow up with DUDA,MARCUS V, MD In 2 weeks.   Specialty:  Orthopedic Surgery   Contact information:   [place] [address] [phone]       Follow up with [facility].   Specialty:  [place] information:   [address] [place] [phone]      Follow up with Tammi Sou, MD In 2 weeks.   Specialty:  Family Medicine   Contact information:   [id number] [place] [address] [phone]        The results of significant diagnostics from this hospitalization (including imaging, microbiology, ancillary and laboratory) are listed below for reference.    Significant Diagnostic Studies: Dg Chest 1 View  06/09/2015  CLINICAL DATA:  RIGHT hip pain EXAM: CHEST 1 VIEW COMPARISON:  03/04/2015, CT abdomen 10/01/2014 FINDINGS: Sternotomy wires overlie normal cardiac silhouette. Nodular density at the LEFT lung base which corresponds to pleural-parenchymal scarring and thickening on comparison CT of 10/01/2014. Chronic bronchitic markings centrally. No pulmonary edema or pleural fluid. No pneumothorax. Atherosclerotic calcification of the abdominal aorta. IMPRESSION: 1. No acute cardiopulmonary process. 2. Chronic parenchymal nodular scarring at the LEFT lung base.  3. Chronic bronchitic markings. 4. Atherosclerotic calcification aorta. Electronically Signed   By: Suzy Bouchard M.D.   On: 06/09/2015 18:58   Dg Hip Unilat With Pelvis 2-3 Views Right  06/09/2015  CLINICAL DATA:  Pt states he was reaching for something and fell. Pt complains of right hip pain. EXAM: DG HIP (WITH OR WITHOUT PELVIS)  2-3V RIGHT COMPARISON:  CT 10/01/2014 FINDINGS: There is a comminuted fracture involving the base of the right femoral neck. No evidence for dislocation. There are degenerative changes involving both hips and lower lumbar spine. There is atherosclerotic calcification of vessels. Pelvis is intact. IMPRESSION: Comminuted fracture of the right femoral neck. Electronically Signed   By: Nolon Nations M.D.   On: 06/09/2015 18:54    Microbiology: No results found for this or any previous visit (from the past 240 hour(s)).   Labs: Basic Metabolic Panel:  Recent Labs Lab 06/09/15 2004 06/11/15 0455 06/12/15 0455  NA 139 139 138  K 4.0 4.3 3.8  CL 105 105 108  CO2 26 26 24   GLUCOSE 113* 117* 111*  BUN 28* 37* 39*  CREATININE 1.40* 1.77* 1.73*  CALCIUM 8.9 8.5* 8.1*   Liver Function Tests: No results for input(s): AST, ALT, ALKPHOS, BILITOT, PROT, ALBUMIN in the last 168 hours. No results for input(s): LIPASE, AMYLASE in the last 168 hours. No results for input(s): AMMONIA in the last 168 hours. CBC:  Recent Labs Lab 06/09/15 2004 06/11/15 0455 06/12/15 0455  WBC 14.3* 9.4 9.1  NEUTROABS 11.8*  --   --   HGB 13.3 10.4* 9.4*  HCT 41.1 31.3* 27.8*  MCV 93.4 91.8 91.4  PLT 136* 121* 102*   Cardiac Enzymes: No results for input(s): CKTOTAL, CKMB, CKMBINDEX, TROPONINI in the last 168 hours. BNP: BNP (last 3 results) No results for input(s): BNP in the last 8760 hours.  ProBNP (last 3 results) No results for input(s): PROBNP in the last 8760 hours.  CBG:  Recent Labs Lab 06/11/15 1623 06/11/15 2130 06/12/15 0011 06/12/15 0426 06/12/15 0802   GLUCAP 145* 143* 125* 105* 110*       Signed:  Kelvin Cellar MD  FACP  [facility] 06/12/2015, 9:33 AM

## 2015-06-12 NOTE — Progress Notes (Signed)
Pt discharge education and instructions completed. Pt discharge to SNF with [facility] to transport him to disposition. Report called off to nurse [place] at [facility]. Pt cleaned and placed in clean gown prior to discharge. Pt transported off unit via stretcher with belongings to the side. Delia Heady RN

## 2015-06-15 DIAGNOSIS — Z86718 Personal history of other venous thrombosis and embolism: Secondary | ICD-10-CM | POA: Diagnosis not present

## 2015-06-15 DIAGNOSIS — I10 Essential (primary) hypertension: Secondary | ICD-10-CM | POA: Diagnosis not present

## 2015-06-15 DIAGNOSIS — S72001A Fracture of unspecified part of neck of right femur, initial encounter for closed fracture: Secondary | ICD-10-CM | POA: Diagnosis not present

## 2015-06-15 DIAGNOSIS — N183 Chronic kidney disease, stage 3 (moderate): Secondary | ICD-10-CM | POA: Diagnosis not present

## 2015-06-16 NOTE — Anesthesia Postprocedure Evaluation (Signed)
Anesthesia Post Note  Patient: Wesley [name]  Procedure(s) Performed: Procedure(s) (LRB): RIGHT HIP HEMIARTHROPLASTY (Right)  Patient location during evaluation: PACU Anesthesia Type: General Level of consciousness: awake and alert Pain management: pain level controlled Vital Signs Assessment: post-procedure vital signs reviewed and stable Respiratory status: spontaneous breathing, nonlabored ventilation, respiratory function stable and patient connected to nasal cannula oxygen Cardiovascular status: blood pressure returned to baseline and stable Postop Assessment: no signs of nausea or vomiting Anesthetic complications: no    Last Vitals:  Filed Vitals:   06/11/15 2002 06/12/15 0618  BP: 144/55 134/53  Pulse: 68 60  Temp: 37.1 C 37.2 C  Resp: 18 18    Last Pain:  Filed Vitals:   06/12/15 1230  PainSc: Asleep                 Effie Berkshire

## 2015-06-26 DIAGNOSIS — S72011D Unspecified intracapsular fracture of right femur, subsequent encounter for closed fracture with routine healing: Secondary | ICD-10-CM | POA: Diagnosis not present

## 2015-07-10 DIAGNOSIS — L89899 Pressure ulcer of other site, unspecified stage: Secondary | ICD-10-CM | POA: Diagnosis not present

## 2015-07-17 DIAGNOSIS — S91109A Unspecified open wound of unspecified toe(s) without damage to nail, initial encounter: Secondary | ICD-10-CM | POA: Diagnosis not present

## 2015-07-17 DIAGNOSIS — S91009A Unspecified open wound, unspecified ankle, initial encounter: Secondary | ICD-10-CM | POA: Diagnosis not present

## 2015-07-23 DIAGNOSIS — E785 Hyperlipidemia, unspecified: Secondary | ICD-10-CM | POA: Diagnosis not present

## 2015-07-24 DIAGNOSIS — S91001A Unspecified open wound, right ankle, initial encounter: Secondary | ICD-10-CM | POA: Diagnosis not present

## 2015-07-24 DIAGNOSIS — S91102A Unspecified open wound of left great toe without damage to nail, initial encounter: Secondary | ICD-10-CM | POA: Diagnosis not present

## 2015-07-24 DIAGNOSIS — F0281 Dementia in other diseases classified elsewhere with behavioral disturbance: Secondary | ICD-10-CM | POA: Diagnosis not present

## 2015-07-24 DIAGNOSIS — R634 Abnormal weight loss: Secondary | ICD-10-CM | POA: Diagnosis not present

## 2015-07-27 DIAGNOSIS — L89611 Pressure ulcer of right heel, stage 1: Secondary | ICD-10-CM | POA: Diagnosis not present

## 2015-07-28 DIAGNOSIS — E785 Hyperlipidemia, unspecified: Secondary | ICD-10-CM | POA: Diagnosis not present

## 2015-07-29 DIAGNOSIS — D6489 Other specified anemias: Secondary | ICD-10-CM | POA: Diagnosis not present

## 2015-07-30 DIAGNOSIS — N183 Chronic kidney disease, stage 3 (moderate): Secondary | ICD-10-CM | POA: Diagnosis not present

## 2015-08-03 DIAGNOSIS — N390 Urinary tract infection, site not specified: Secondary | ICD-10-CM | POA: Diagnosis not present

## 2015-08-03 DIAGNOSIS — L89899 Pressure ulcer of other site, unspecified stage: Secondary | ICD-10-CM | POA: Diagnosis not present

## 2015-08-03 DIAGNOSIS — S91102A Unspecified open wound of left great toe without damage to nail, initial encounter: Secondary | ICD-10-CM | POA: Diagnosis not present

## 2015-08-03 DIAGNOSIS — N183 Chronic kidney disease, stage 3 (moderate): Secondary | ICD-10-CM | POA: Diagnosis not present

## 2015-08-04 DIAGNOSIS — L89610 Pressure ulcer of right heel, unstageable: Secondary | ICD-10-CM | POA: Diagnosis not present

## 2015-08-11 DIAGNOSIS — I209 Angina pectoris, unspecified: Secondary | ICD-10-CM | POA: Diagnosis not present

## 2015-08-12 DIAGNOSIS — S91102A Unspecified open wound of left great toe without damage to nail, initial encounter: Secondary | ICD-10-CM | POA: Diagnosis not present

## 2015-08-14 DIAGNOSIS — E119 Type 2 diabetes mellitus without complications: Secondary | ICD-10-CM | POA: Diagnosis not present

## 2015-08-17 DIAGNOSIS — S72001A Fracture of unspecified part of neck of right femur, initial encounter for closed fracture: Secondary | ICD-10-CM | POA: Diagnosis not present

## 2015-08-17 DIAGNOSIS — I10 Essential (primary) hypertension: Secondary | ICD-10-CM | POA: Diagnosis not present

## 2015-08-17 DIAGNOSIS — N183 Chronic kidney disease, stage 3 (moderate): Secondary | ICD-10-CM | POA: Diagnosis not present

## 2015-08-20 DIAGNOSIS — R7989 Other specified abnormal findings of blood chemistry: Secondary | ICD-10-CM | POA: Diagnosis not present

## 2015-08-21 DIAGNOSIS — I2510 Atherosclerotic heart disease of native coronary artery without angina pectoris: Secondary | ICD-10-CM | POA: Diagnosis not present

## 2015-08-21 DIAGNOSIS — I10 Essential (primary) hypertension: Secondary | ICD-10-CM | POA: Diagnosis not present

## 2015-08-26 DIAGNOSIS — S91102D Unspecified open wound of left great toe without damage to nail, subsequent encounter: Secondary | ICD-10-CM | POA: Diagnosis not present

## 2015-08-26 DIAGNOSIS — R531 Weakness: Secondary | ICD-10-CM | POA: Diagnosis not present

## 2015-09-01 DIAGNOSIS — S91102D Unspecified open wound of left great toe without damage to nail, subsequent encounter: Secondary | ICD-10-CM | POA: Diagnosis not present

## 2015-09-03 DIAGNOSIS — E785 Hyperlipidemia, unspecified: Secondary | ICD-10-CM | POA: Diagnosis not present

## 2015-09-04 DIAGNOSIS — S91102D Unspecified open wound of left great toe without damage to nail, subsequent encounter: Secondary | ICD-10-CM | POA: Diagnosis not present

## 2015-09-11 DIAGNOSIS — E119 Type 2 diabetes mellitus without complications: Secondary | ICD-10-CM | POA: Diagnosis not present

## 2015-09-15 DIAGNOSIS — Z789 Other specified health status: Secondary | ICD-10-CM | POA: Diagnosis not present

## 2015-09-23 DIAGNOSIS — M2550 Pain in unspecified joint: Secondary | ICD-10-CM | POA: Diagnosis not present

## 2015-09-24 DIAGNOSIS — E7800 Pure hypercholesterolemia, unspecified: Secondary | ICD-10-CM | POA: Diagnosis not present

## 2015-09-24 DIAGNOSIS — M1611 Unilateral primary osteoarthritis, right hip: Secondary | ICD-10-CM | POA: Diagnosis not present

## 2015-09-24 DIAGNOSIS — E785 Hyperlipidemia, unspecified: Secondary | ICD-10-CM | POA: Diagnosis not present

## 2015-09-28 DIAGNOSIS — M1993 Secondary osteoarthritis, unspecified site: Secondary | ICD-10-CM | POA: Diagnosis not present

## 2015-10-01 DIAGNOSIS — F418 Other specified anxiety disorders: Secondary | ICD-10-CM | POA: Diagnosis not present

## 2015-10-01 DIAGNOSIS — F0281 Dementia in other diseases classified elsewhere with behavioral disturbance: Secondary | ICD-10-CM | POA: Diagnosis not present

## 2015-10-01 DIAGNOSIS — F29 Unspecified psychosis not due to a substance or known physiological condition: Secondary | ICD-10-CM | POA: Diagnosis not present

## 2015-10-05 DIAGNOSIS — D6489 Other specified anemias: Secondary | ICD-10-CM | POA: Diagnosis not present

## 2015-10-05 DIAGNOSIS — N183 Chronic kidney disease, stage 3 (moderate): Secondary | ICD-10-CM | POA: Diagnosis not present

## 2015-10-05 DIAGNOSIS — E119 Type 2 diabetes mellitus without complications: Secondary | ICD-10-CM | POA: Diagnosis not present

## 2015-10-06 DIAGNOSIS — Z79899 Other long term (current) drug therapy: Secondary | ICD-10-CM | POA: Diagnosis not present

## 2015-10-06 DIAGNOSIS — F0391 Unspecified dementia with behavioral disturbance: Secondary | ICD-10-CM | POA: Diagnosis not present

## 2015-10-08 DIAGNOSIS — E1122 Type 2 diabetes mellitus with diabetic chronic kidney disease: Secondary | ICD-10-CM | POA: Diagnosis not present

## 2015-10-08 DIAGNOSIS — N183 Chronic kidney disease, stage 3 (moderate): Secondary | ICD-10-CM | POA: Diagnosis not present

## 2015-10-26 DIAGNOSIS — E119 Type 2 diabetes mellitus without complications: Secondary | ICD-10-CM | POA: Diagnosis not present

## 2015-10-30 DIAGNOSIS — G8929 Other chronic pain: Secondary | ICD-10-CM | POA: Diagnosis not present

## 2015-10-30 DIAGNOSIS — R198 Other specified symptoms and signs involving the digestive system and abdomen: Secondary | ICD-10-CM | POA: Diagnosis not present

## 2015-11-03 ENCOUNTER — Telehealth: Payer: Self-pay

## 2015-11-03 NOTE — Telephone Encounter (Signed)
Patient is on the list for [employer] 2017 and may be a good candidate for an AWV in 2017.  

## 2015-11-04 NOTE — Telephone Encounter (Signed)
Please contact patient to set up AVW and let [facility] know once they are scheduled.  °

## 2015-11-12 NOTE — Telephone Encounter (Signed)
Pt is no longer listed under a [facility] PCP.

## 2015-12-07 DIAGNOSIS — E119 Type 2 diabetes mellitus without complications: Secondary | ICD-10-CM | POA: Diagnosis not present

## 2015-12-07 DIAGNOSIS — I10 Essential (primary) hypertension: Secondary | ICD-10-CM | POA: Diagnosis not present

## 2015-12-18 DIAGNOSIS — I10 Essential (primary) hypertension: Secondary | ICD-10-CM | POA: Diagnosis not present

## 2015-12-18 DIAGNOSIS — B354 Tinea corporis: Secondary | ICD-10-CM | POA: Diagnosis not present

## 2015-12-25 DIAGNOSIS — E119 Type 2 diabetes mellitus without complications: Secondary | ICD-10-CM | POA: Diagnosis not present

## 2015-12-30 DIAGNOSIS — E11622 Type 2 diabetes mellitus with other skin ulcer: Secondary | ICD-10-CM | POA: Diagnosis not present

## 2015-12-30 DIAGNOSIS — S30813A Abrasion of scrotum and testes, initial encounter: Secondary | ICD-10-CM | POA: Diagnosis not present

## 2016-01-06 DIAGNOSIS — E11622 Type 2 diabetes mellitus with other skin ulcer: Secondary | ICD-10-CM | POA: Diagnosis not present

## 2016-01-06 DIAGNOSIS — S30813A Abrasion of scrotum and testes, initial encounter: Secondary | ICD-10-CM | POA: Diagnosis not present

## 2016-01-06 DIAGNOSIS — I10 Essential (primary) hypertension: Secondary | ICD-10-CM | POA: Diagnosis not present

## 2016-01-13 DIAGNOSIS — N183 Chronic kidney disease, stage 3 (moderate): Secondary | ICD-10-CM | POA: Diagnosis not present

## 2016-01-13 DIAGNOSIS — E11622 Type 2 diabetes mellitus with other skin ulcer: Secondary | ICD-10-CM | POA: Diagnosis not present

## 2016-01-13 DIAGNOSIS — S30813A Abrasion of scrotum and testes, initial encounter: Secondary | ICD-10-CM | POA: Diagnosis not present

## 2016-01-13 DIAGNOSIS — I10 Essential (primary) hypertension: Secondary | ICD-10-CM | POA: Diagnosis not present

## 2016-01-20 DIAGNOSIS — I10 Essential (primary) hypertension: Secondary | ICD-10-CM | POA: Diagnosis not present

## 2016-01-20 DIAGNOSIS — E11622 Type 2 diabetes mellitus with other skin ulcer: Secondary | ICD-10-CM | POA: Diagnosis not present

## 2016-01-20 DIAGNOSIS — S30813A Abrasion of scrotum and testes, initial encounter: Secondary | ICD-10-CM | POA: Diagnosis not present

## 2016-01-20 DIAGNOSIS — I259 Chronic ischemic heart disease, unspecified: Secondary | ICD-10-CM | POA: Diagnosis not present

## 2016-01-22 DIAGNOSIS — R918 Other nonspecific abnormal finding of lung field: Secondary | ICD-10-CM | POA: Diagnosis not present

## 2016-01-25 DIAGNOSIS — I10 Essential (primary) hypertension: Secondary | ICD-10-CM | POA: Diagnosis not present

## 2016-01-25 DIAGNOSIS — J181 Lobar pneumonia, unspecified organism: Secondary | ICD-10-CM | POA: Diagnosis not present

## 2016-01-25 DIAGNOSIS — J189 Pneumonia, unspecified organism: Secondary | ICD-10-CM | POA: Diagnosis not present

## 2016-01-25 DIAGNOSIS — J9621 Acute and chronic respiratory failure with hypoxia: Secondary | ICD-10-CM | POA: Diagnosis not present

## 2016-01-25 DIAGNOSIS — Z515 Encounter for palliative care: Secondary | ICD-10-CM | POA: Diagnosis not present

## 2016-01-27 DIAGNOSIS — S30813A Abrasion of scrotum and testes, initial encounter: Secondary | ICD-10-CM | POA: Diagnosis not present

## 2016-01-27 DIAGNOSIS — N183 Chronic kidney disease, stage 3 (moderate): Secondary | ICD-10-CM | POA: Diagnosis not present

## 2016-01-29 DIAGNOSIS — I2510 Atherosclerotic heart disease of native coronary artery without angina pectoris: Secondary | ICD-10-CM | POA: Diagnosis not present

## 2016-01-29 DIAGNOSIS — M79646 Pain in unspecified finger(s): Secondary | ICD-10-CM | POA: Diagnosis not present

## 2016-01-29 DIAGNOSIS — I638 Other cerebral infarction: Secondary | ICD-10-CM | POA: Diagnosis not present

## 2016-01-29 DIAGNOSIS — Z515 Encounter for palliative care: Secondary | ICD-10-CM | POA: Diagnosis not present

## 2016-01-29 DIAGNOSIS — A419 Sepsis, unspecified organism: Secondary | ICD-10-CM | POA: Diagnosis not present

## 2016-01-29 DIAGNOSIS — R1310 Dysphagia, unspecified: Secondary | ICD-10-CM | POA: Diagnosis not present

## 2016-01-29 DIAGNOSIS — Z7982 Long term (current) use of aspirin: Secondary | ICD-10-CM | POA: Diagnosis not present

## 2016-01-29 DIAGNOSIS — S299XXA Unspecified injury of thorax, initial encounter: Secondary | ICD-10-CM | POA: Diagnosis not present

## 2016-01-29 DIAGNOSIS — G9340 Encephalopathy, unspecified: Secondary | ICD-10-CM | POA: Diagnosis not present

## 2016-01-29 DIAGNOSIS — Z96641 Presence of right artificial hip joint: Secondary | ICD-10-CM | POA: Diagnosis not present

## 2016-01-29 DIAGNOSIS — S61216A Laceration without foreign body of right little finger without damage to nail, initial encounter: Secondary | ICD-10-CM | POA: Diagnosis not present

## 2016-01-29 DIAGNOSIS — E039 Hypothyroidism, unspecified: Secondary | ICD-10-CM | POA: Diagnosis not present

## 2016-01-29 DIAGNOSIS — S0083XA Contusion of other part of head, initial encounter: Secondary | ICD-10-CM | POA: Diagnosis not present

## 2016-01-29 DIAGNOSIS — S3991XA Unspecified injury of abdomen, initial encounter: Secondary | ICD-10-CM | POA: Diagnosis not present

## 2016-01-29 DIAGNOSIS — N179 Acute kidney failure, unspecified: Secondary | ICD-10-CM | POA: Diagnosis not present

## 2016-01-29 DIAGNOSIS — I700 Atherosclerosis of aorta: Secondary | ICD-10-CM | POA: Diagnosis not present

## 2016-01-29 DIAGNOSIS — Z8673 Personal history of transient ischemic attack (TIA), and cerebral infarction without residual deficits: Secondary | ICD-10-CM | POA: Diagnosis not present

## 2016-01-29 DIAGNOSIS — T68XXXA Hypothermia, initial encounter: Secondary | ICD-10-CM | POA: Diagnosis not present

## 2016-01-29 DIAGNOSIS — N183 Chronic kidney disease, stage 3 (moderate): Secondary | ICD-10-CM | POA: Diagnosis not present

## 2016-01-29 DIAGNOSIS — I255 Ischemic cardiomyopathy: Secondary | ICD-10-CM | POA: Diagnosis not present

## 2016-01-29 DIAGNOSIS — D696 Thrombocytopenia, unspecified: Secondary | ICD-10-CM | POA: Diagnosis not present

## 2016-01-29 DIAGNOSIS — S61411A Laceration without foreign body of right hand, initial encounter: Secondary | ICD-10-CM | POA: Diagnosis not present

## 2016-01-29 DIAGNOSIS — S0003XA Contusion of scalp, initial encounter: Secondary | ICD-10-CM | POA: Diagnosis not present

## 2016-01-29 DIAGNOSIS — Z951 Presence of aortocoronary bypass graft: Secondary | ICD-10-CM | POA: Diagnosis not present

## 2016-01-29 DIAGNOSIS — S51012A Laceration without foreign body of left elbow, initial encounter: Secondary | ICD-10-CM | POA: Diagnosis not present

## 2016-01-29 DIAGNOSIS — D649 Anemia, unspecified: Secondary | ICD-10-CM | POA: Diagnosis not present

## 2016-01-29 DIAGNOSIS — S61412A Laceration without foreign body of left hand, initial encounter: Secondary | ICD-10-CM | POA: Diagnosis not present

## 2016-01-29 DIAGNOSIS — S0093XA Contusion of unspecified part of head, initial encounter: Secondary | ICD-10-CM | POA: Diagnosis not present

## 2016-01-29 DIAGNOSIS — I129 Hypertensive chronic kidney disease with stage 1 through stage 4 chronic kidney disease, or unspecified chronic kidney disease: Secondary | ICD-10-CM | POA: Diagnosis not present

## 2016-01-29 DIAGNOSIS — I959 Hypotension, unspecified: Secondary | ICD-10-CM | POA: Diagnosis not present

## 2016-01-29 DIAGNOSIS — I252 Old myocardial infarction: Secondary | ICD-10-CM | POA: Diagnosis not present

## 2016-01-29 DIAGNOSIS — E1122 Type 2 diabetes mellitus with diabetic chronic kidney disease: Secondary | ICD-10-CM | POA: Diagnosis not present

## 2016-01-29 DIAGNOSIS — Z86711 Personal history of pulmonary embolism: Secondary | ICD-10-CM | POA: Diagnosis not present

## 2016-01-29 DIAGNOSIS — Z66 Do not resuscitate: Secondary | ICD-10-CM | POA: Diagnosis not present

## 2016-01-29 DIAGNOSIS — Z86718 Personal history of other venous thrombosis and embolism: Secondary | ICD-10-CM | POA: Diagnosis not present

## 2016-01-29 DIAGNOSIS — T148 Other injury of unspecified body region: Secondary | ICD-10-CM | POA: Diagnosis not present

## 2016-01-29 DIAGNOSIS — R6520 Severe sepsis without septic shock: Secondary | ICD-10-CM | POA: Diagnosis not present

## 2016-01-29 DIAGNOSIS — R001 Bradycardia, unspecified: Secondary | ICD-10-CM | POA: Diagnosis not present

## 2016-01-29 DIAGNOSIS — E440 Moderate protein-calorie malnutrition: Secondary | ICD-10-CM | POA: Diagnosis not present

## 2016-03-06 DEATH — deceased

## 2016-07-26 IMAGING — DX DG CHEST 2V
2 series · 2 of 2 positions shown · non-contrast
Comparison: October 21, 2014.

CLINICAL DATA: Chest pain.

EXAM:
CHEST  2 VIEW

[chest lat]
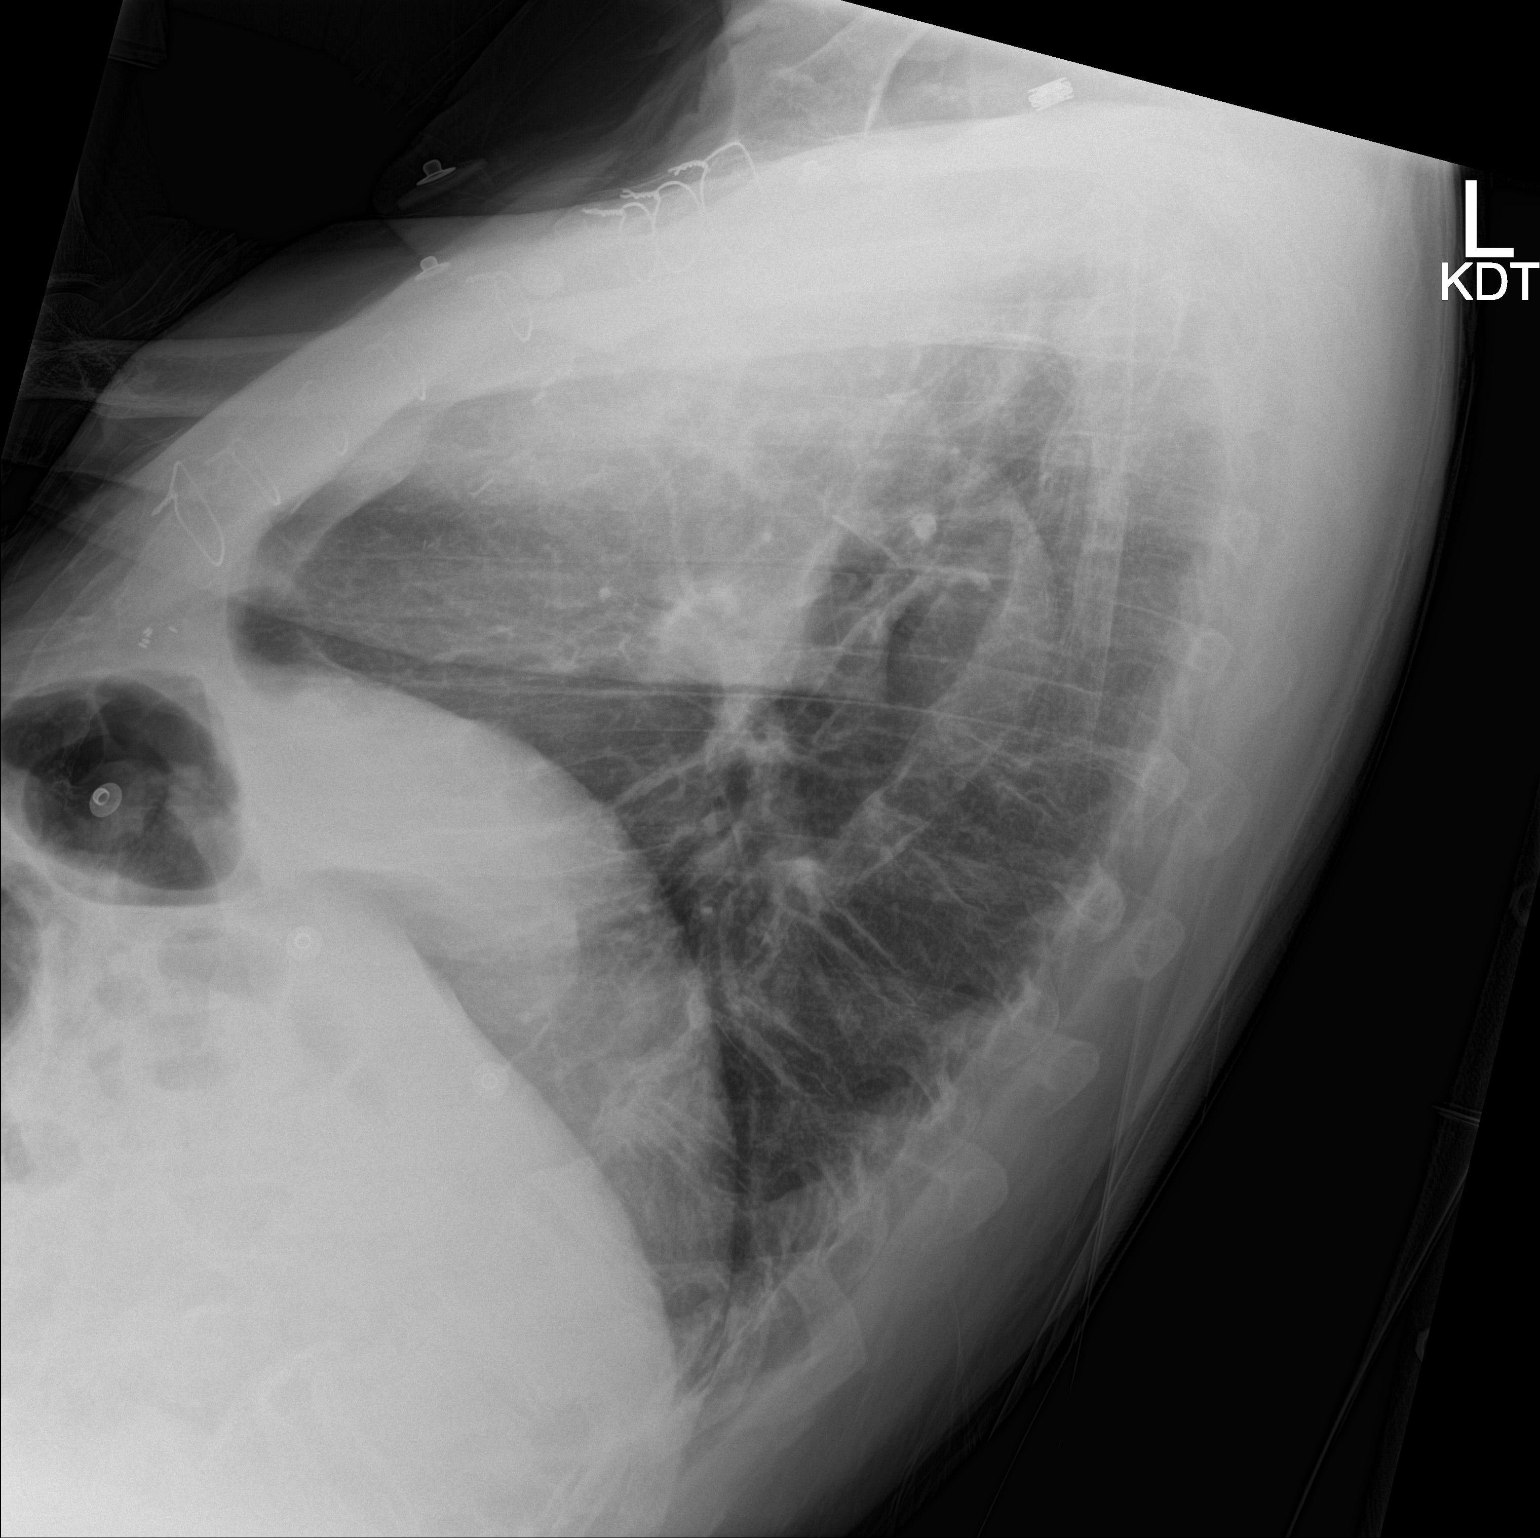

[chest ap]
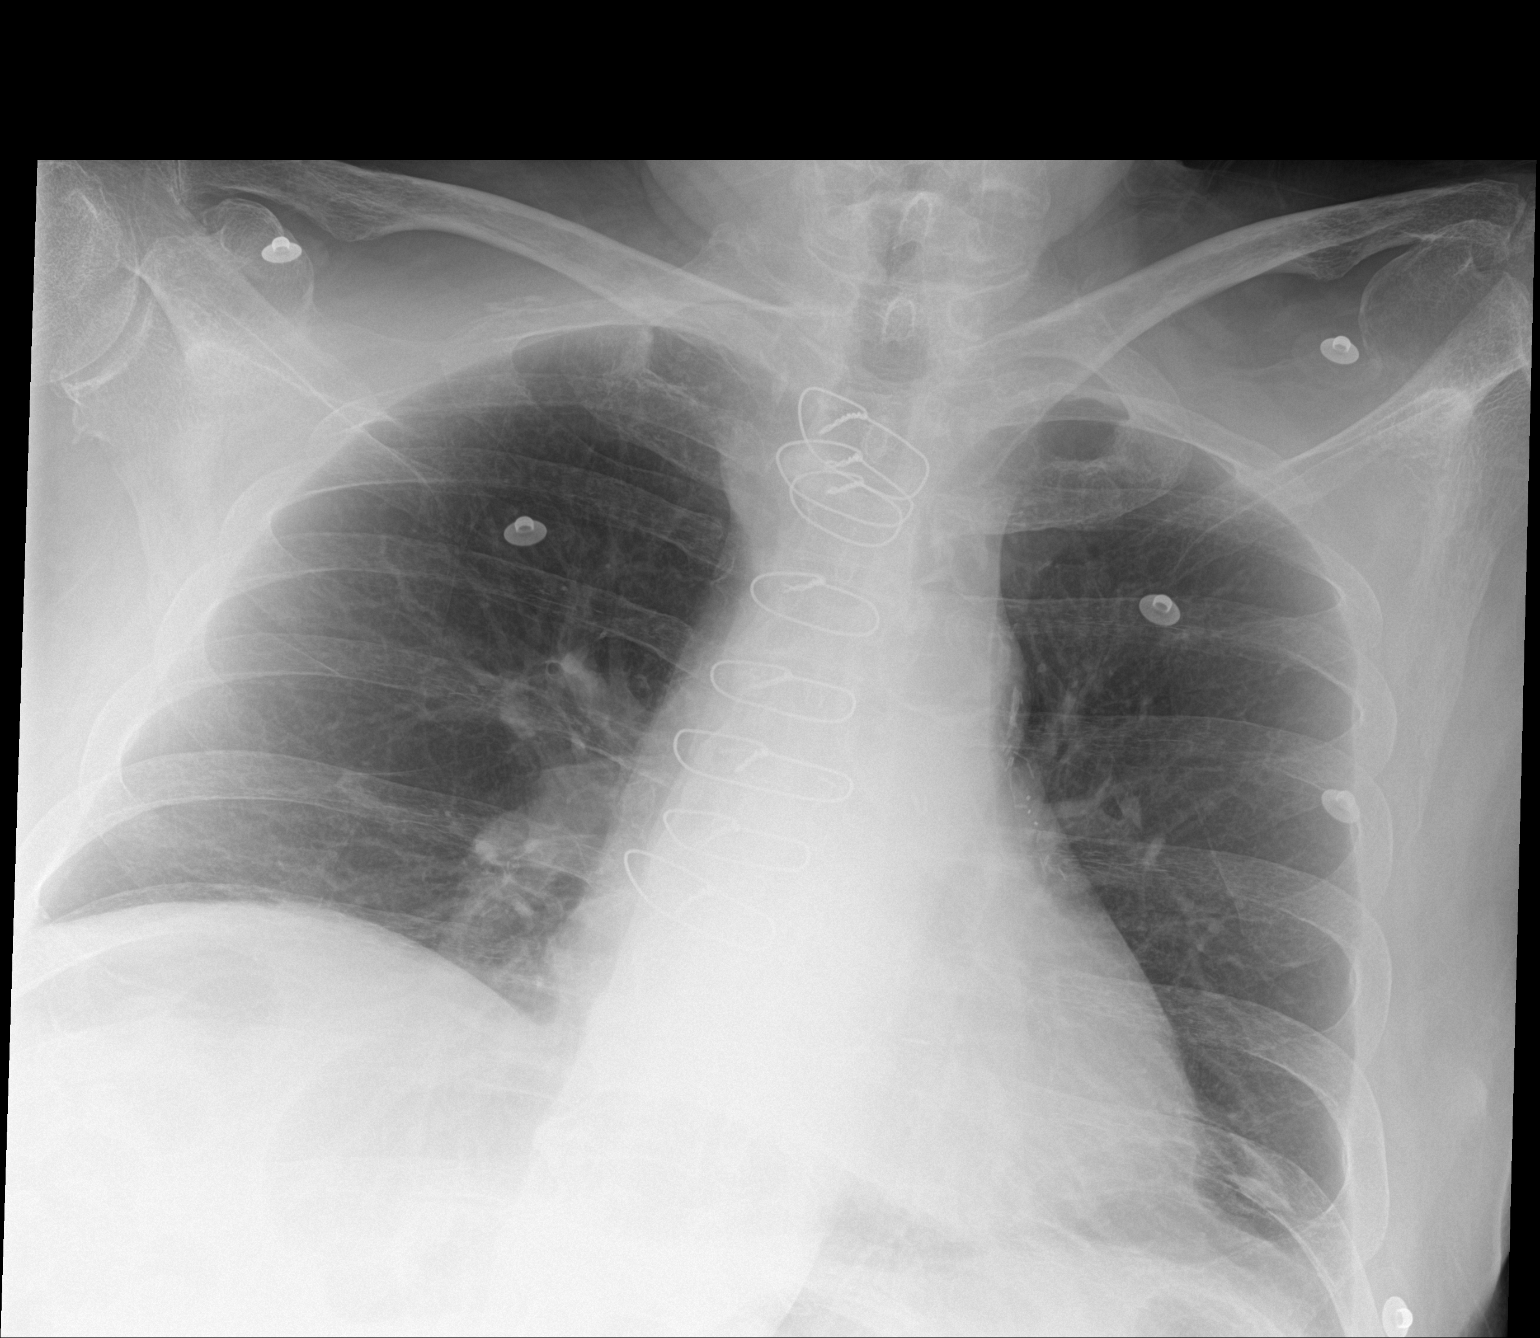

[2 of 2 positions shown; findings below may reference images not displayed]

FINDINGS: The heart size and mediastinal contours are within normal limits. No
pneumothorax or pleural effusion is noted. Sternotomy wires are
noted. Right lung is clear. Minimal left basilar subsegmental
atelectasis is noted. The visualized skeletal structures are
unremarkable.
IMPRESSION: Minimal left basilar subsegmental atelectasis.

## 2016-10-14 IMAGING — CT CT HEAD W/O CM
2 series · 16 of 30 positions shown, 20 images · non-contrast
Comparison: Brain MRI 01/28/2009.  Head CT scan 01/07/2014.

CLINICAL DATA: Bilateral lower extremity weakness for the past 2
days. Initial encounter.

EXAM:
CT HEAD WITHOUT CONTRAST
TECHNIQUE: Contiguous axial images were obtained from the base of the skull
through the vertex without intravenous contrast.

[Series 2: head w/o · axial · non-contrast · 0.45mm/px · z∈[-152,-32]mm · 13 of 28 slices shown, 17 images]
[im 2/28  brain]
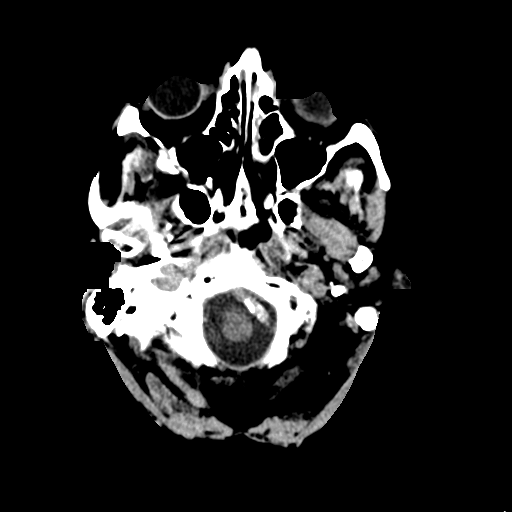
[im 2/28  bone]
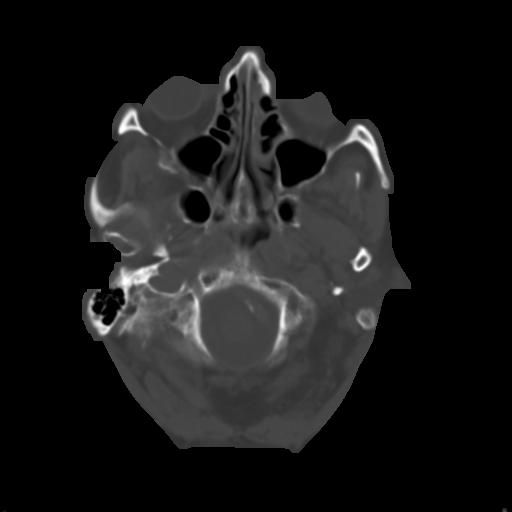
[im 4/28  brain]
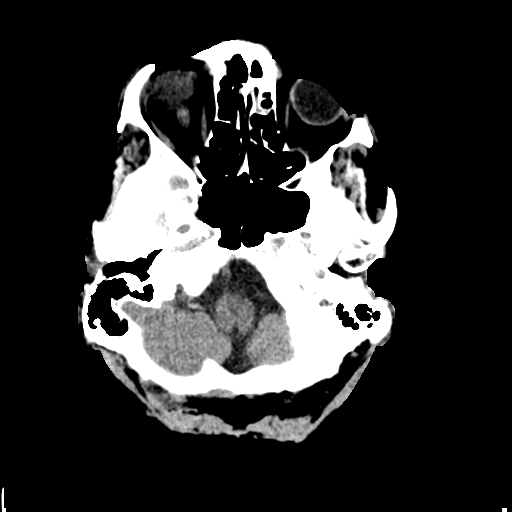
[im 6/28  brain]
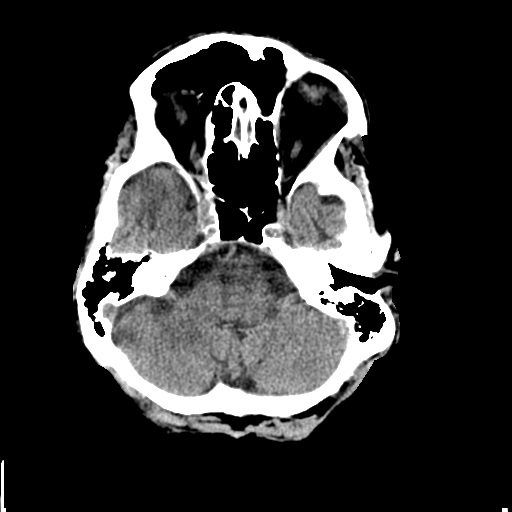
[im 8/28  brain]
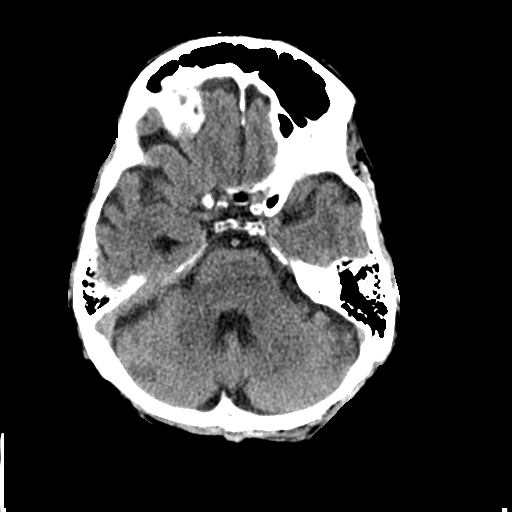
[im 10/28  brain]
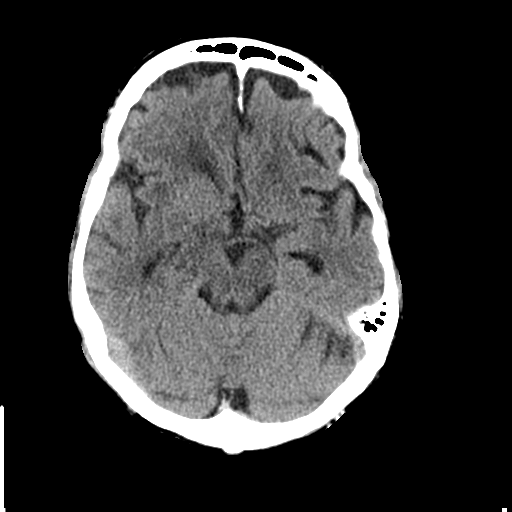
[im 10/28  bone]
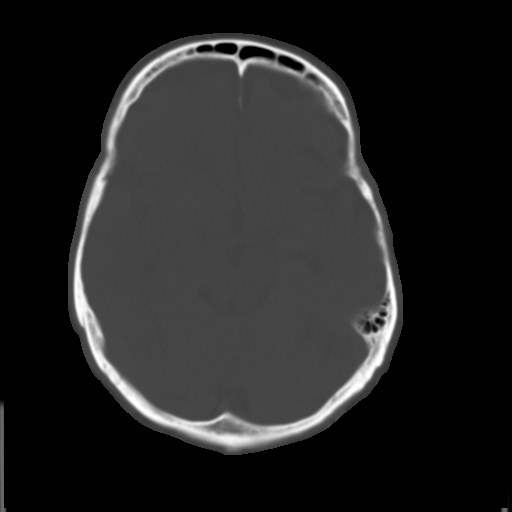
[im 12/28  brain]
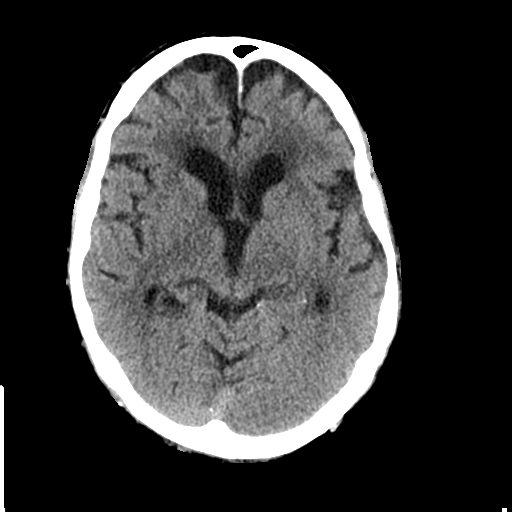
[im 14/28  brain]
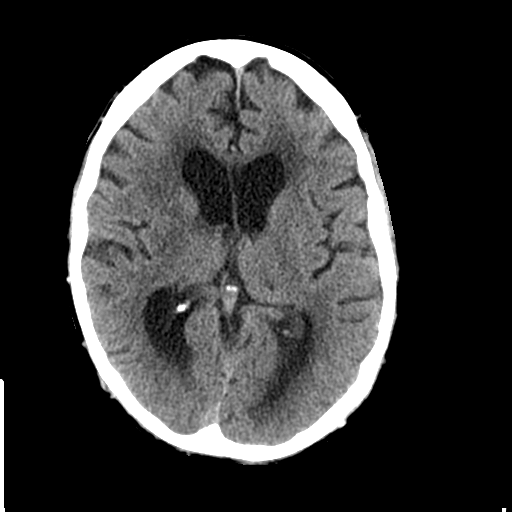
[im 16/28  brain]
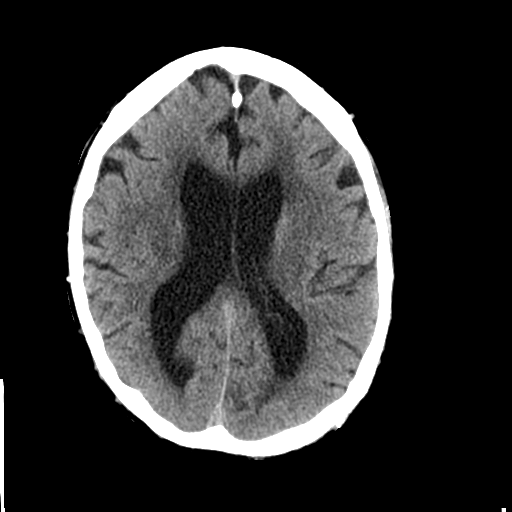
[im 18/28  brain]
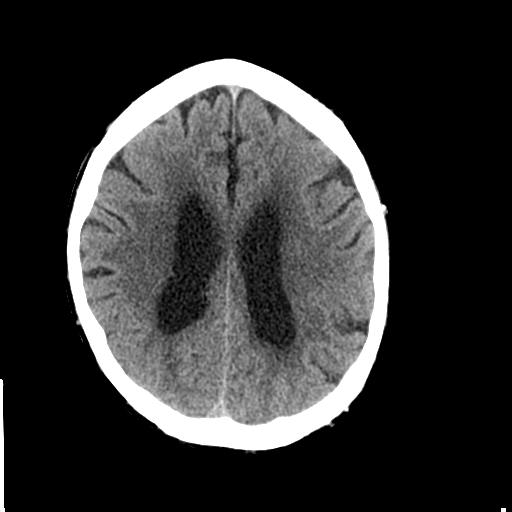
[im 18/28  bone]
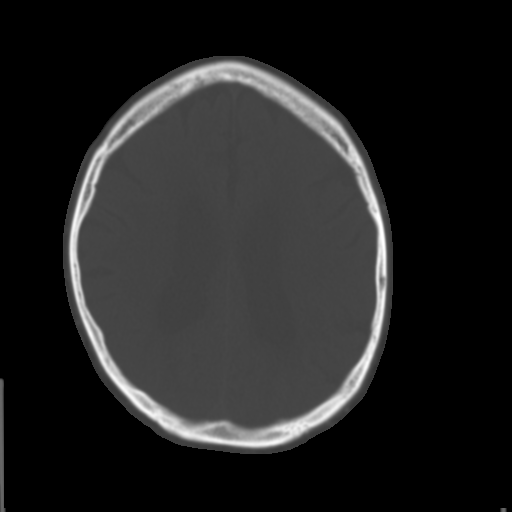
[im 20/28  brain]
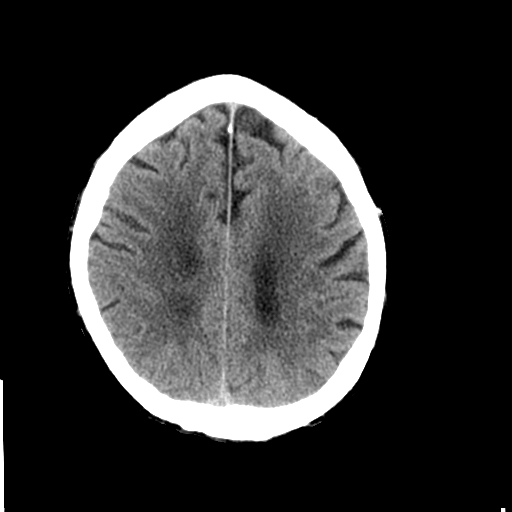
[im 22/28  brain]
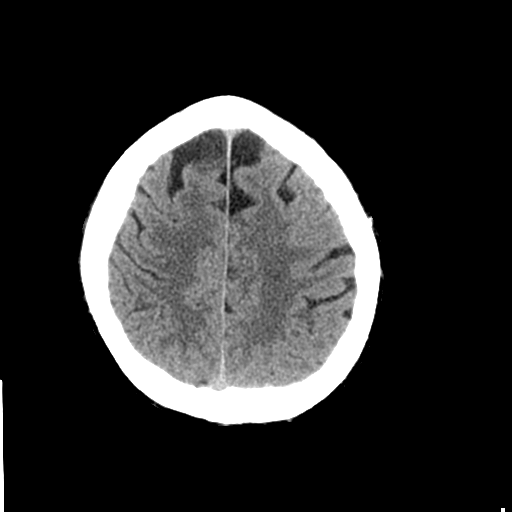
[im 24/28  brain]
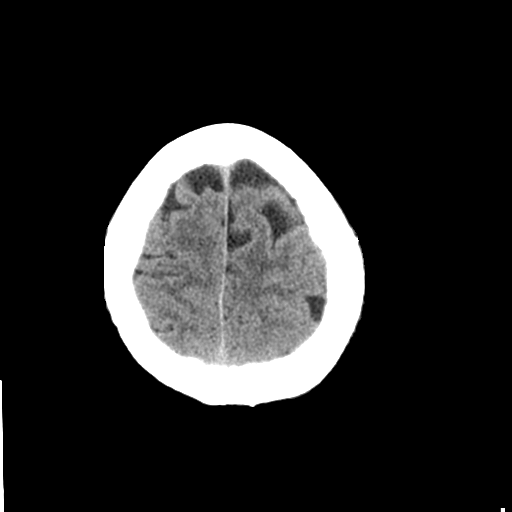
[im 26/28  brain]
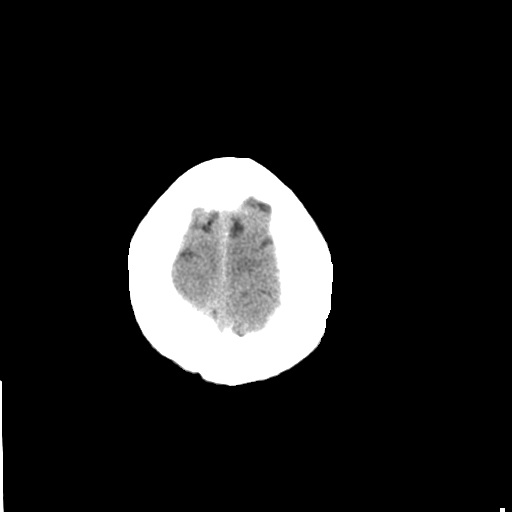
[im 26/28  bone]
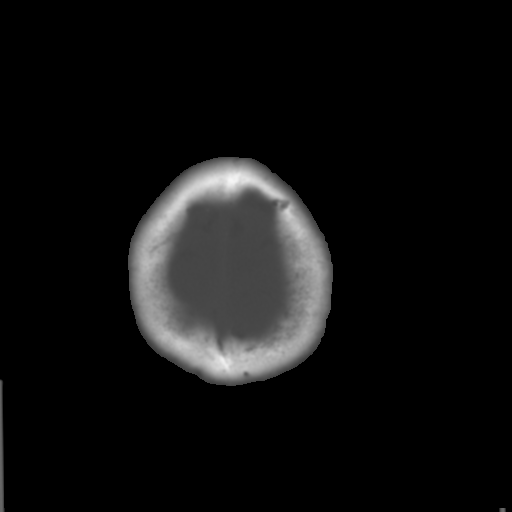

[Series 3: bone windows · axial · 0.45mm/px · z∈[-152,-112]mm · 3 of 28 slices shown]
[im 2/28  bone]
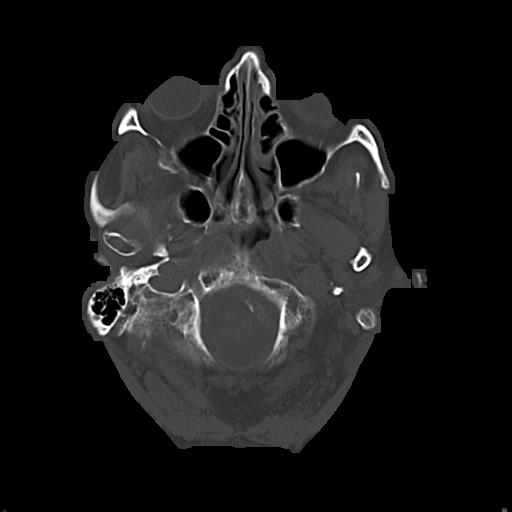
[im 6/28  bone]
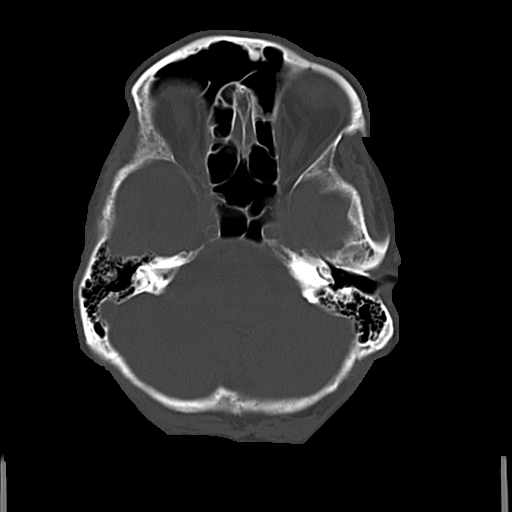
[im 10/28  bone]
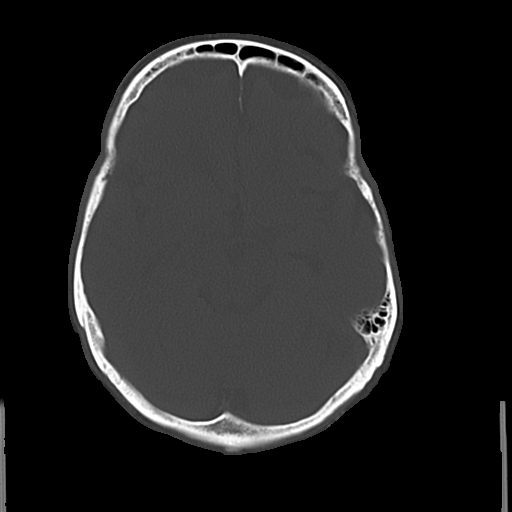

[16 of 30 positions shown; findings below may reference images not displayed]

FINDINGS: Cortical atrophy and chronic microvascular ischemic change are seen.
Remote, small lacunar infarction in the left thalamus is noted. No
evidence of acute intracranial abnormality including hemorrhage,
infarct, mass lesion, mass effect, midline shift or abnormal
extra-axial fluid collection. No hydrocephalus or pneumocephalus.
The calvarium is intact. Imaged paranasal sinuses demonstrate some
mild ethmoid air cell disease are otherwise clear.
IMPRESSION: No acute abnormality.

Atrophy and chronic microvascular ischemic change.

## 2017-01-19 IMAGING — CR DG CHEST 1V
1 series · 1 of 1 positions shown · non-contrast
Comparison: 03/04/2015, CT abdomen 10/01/2014

CLINICAL DATA: RIGHT hip pain

EXAM:
CHEST 1 VIEW

[x chest ap]
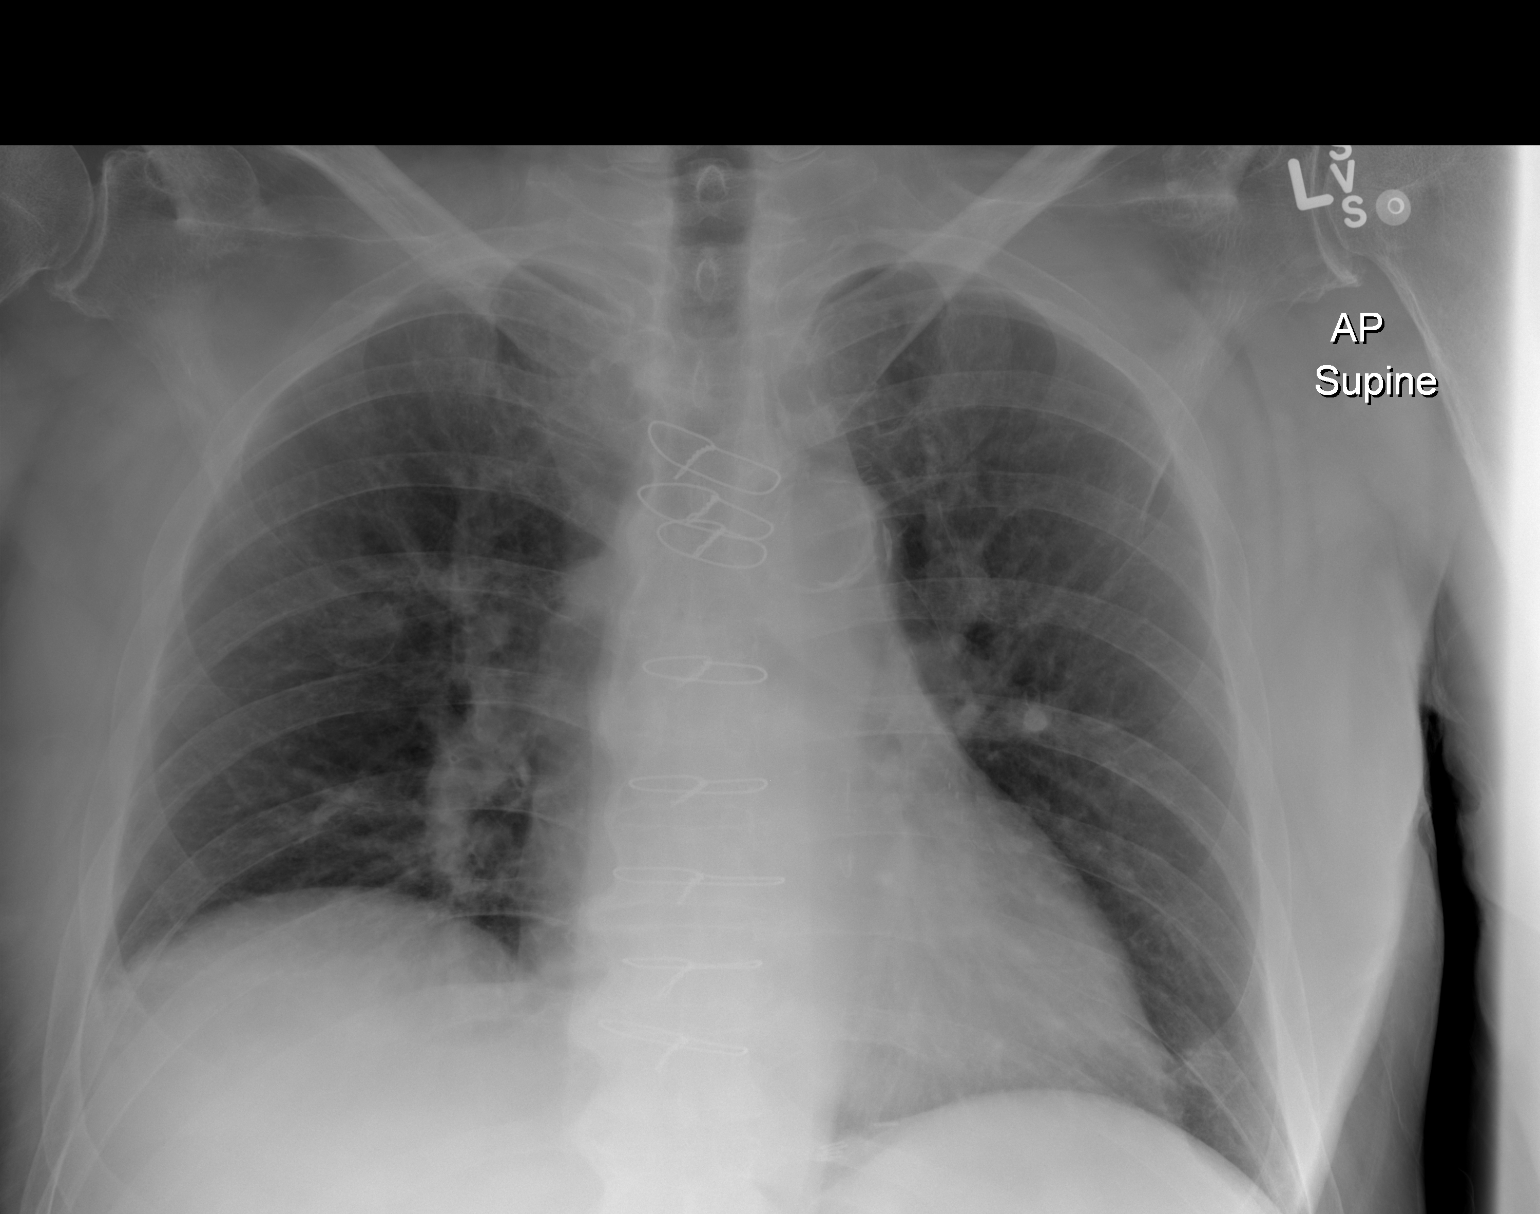

[1 of 1 positions shown; findings below may reference images not displayed]

FINDINGS: Sternotomy wires overlie normal cardiac silhouette. Nodular density
at the LEFT lung base which corresponds to pleural-parenchymal
scarring and thickening on comparison CT of 10/01/2014. Chronic
bronchitic markings centrally. No pulmonary edema or pleural fluid.
No pneumothorax. Atherosclerotic calcification of the abdominal
aorta.
IMPRESSION: 1. No acute cardiopulmonary process.
2. Chronic parenchymal nodular scarring at the LEFT lung base.
3. Chronic bronchitic markings.
4. Atherosclerotic calcification aorta.

## 2022-08-15 ENCOUNTER — Telehealth: Payer: Self-pay | Admitting: Geriatric Medicine

## 2022-08-15 NOTE — Telephone Encounter (Signed)
Contacted Wesley [name] to schedule their annual wellness visit. Patient declined to schedule AWV at this time.  No longer patient at [facility].  [place] [facility] Dial [phone]
